# Patient Record
Sex: Female | Born: 1950
Health system: Southern US, Community
[De-identification: ages and names within clinical notes are randomized; demographics above are authoritative.]

## PROBLEM LIST (undated history)

## (undated) DIAGNOSIS — F329 Major depressive disorder, single episode, unspecified: Secondary | ICD-10-CM

## (undated) DIAGNOSIS — E039 Hypothyroidism, unspecified: Secondary | ICD-10-CM

## (undated) DIAGNOSIS — E785 Hyperlipidemia, unspecified: Secondary | ICD-10-CM

## (undated) DIAGNOSIS — R002 Palpitations: Secondary | ICD-10-CM

## (undated) DIAGNOSIS — M1990 Unspecified osteoarthritis, unspecified site: Secondary | ICD-10-CM

## (undated) DIAGNOSIS — E119 Type 2 diabetes mellitus without complications: Secondary | ICD-10-CM

## (undated) DIAGNOSIS — F32A Depression, unspecified: Secondary | ICD-10-CM

## (undated) DIAGNOSIS — I10 Essential (primary) hypertension: Secondary | ICD-10-CM

## (undated) HISTORY — DX: Hyperlipidemia, unspecified: E78.5

## (undated) HISTORY — DX: Unspecified osteoarthritis, unspecified site: M19.90

---
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
## 1983-05-02 HISTORY — PX: CHOLECYSTECTOMY: SHX55

## 2000-11-08 ENCOUNTER — Emergency Department (HOSPITAL_COMMUNITY): Admission: EM | Admit: 2000-11-08 | Discharge: 2000-11-08 | Payer: Self-pay | Admitting: Emergency Medicine

## 2012-05-31 DIAGNOSIS — E119 Type 2 diabetes mellitus without complications: Secondary | ICD-10-CM

## 2012-05-31 HISTORY — DX: Type 2 diabetes mellitus without complications: E11.9

## 2012-07-25 ENCOUNTER — Encounter (HOSPITAL_COMMUNITY): Payer: Self-pay | Admitting: *Deleted

## 2012-07-25 ENCOUNTER — Inpatient Hospital Stay (HOSPITAL_COMMUNITY)
Admission: AD | Admit: 2012-07-25 | Discharge: 2012-07-25 | Disposition: A | Discharge status: Home / Self Care | Payer: Self-pay | Source: Ambulatory Visit | Attending: Emergency Medicine | Admitting: Emergency Medicine

## 2012-07-25 DIAGNOSIS — Z9114 Patient's other noncompliance with medication regimen: Secondary | ICD-10-CM

## 2012-07-25 DIAGNOSIS — E1169 Type 2 diabetes mellitus with other specified complication: Secondary | ICD-10-CM | POA: Insufficient documentation

## 2012-07-25 DIAGNOSIS — R42 Dizziness and giddiness: Secondary | ICD-10-CM | POA: Insufficient documentation

## 2012-07-25 DIAGNOSIS — E118 Type 2 diabetes mellitus with unspecified complications: Secondary | ICD-10-CM

## 2012-07-25 DIAGNOSIS — E1165 Type 2 diabetes mellitus with hyperglycemia: Secondary | ICD-10-CM

## 2012-07-25 DIAGNOSIS — R739 Hyperglycemia, unspecified: Secondary | ICD-10-CM

## 2012-07-25 DIAGNOSIS — R350 Frequency of micturition: Secondary | ICD-10-CM | POA: Insufficient documentation

## 2012-07-25 DIAGNOSIS — Z79899 Other long term (current) drug therapy: Secondary | ICD-10-CM | POA: Insufficient documentation

## 2012-07-25 DIAGNOSIS — I10 Essential (primary) hypertension: Secondary | ICD-10-CM | POA: Insufficient documentation

## 2012-07-25 DIAGNOSIS — F3289 Other specified depressive episodes: Secondary | ICD-10-CM | POA: Insufficient documentation

## 2012-07-25 DIAGNOSIS — R9431 Abnormal electrocardiogram [ECG] [EKG]: Secondary | ICD-10-CM

## 2012-07-25 DIAGNOSIS — F329 Major depressive disorder, single episode, unspecified: Secondary | ICD-10-CM | POA: Insufficient documentation

## 2012-07-25 DIAGNOSIS — R002 Palpitations: Secondary | ICD-10-CM | POA: Insufficient documentation

## 2012-07-25 DIAGNOSIS — Z794 Long term (current) use of insulin: Secondary | ICD-10-CM | POA: Insufficient documentation

## 2012-07-25 HISTORY — DX: Major depressive disorder, single episode, unspecified: F32.9

## 2012-07-25 HISTORY — DX: Palpitations: R00.2

## 2012-07-25 HISTORY — DX: Depression, unspecified: F32.A

## 2012-07-25 HISTORY — DX: Essential (primary) hypertension: I10

## 2012-07-25 HISTORY — DX: Type 2 diabetes mellitus without complications: E11.9

## 2012-07-25 LAB — POCT I-STAT TROPONIN I: Troponin i, poc: 0 ng/mL (ref 0.00–0.08)

## 2012-07-25 LAB — BASIC METABOLIC PANEL
BUN: 22 mg/dL (ref 6–23)
CO2: 24 mEq/L (ref 19–32)
Calcium: 9.5 mg/dL (ref 8.4–10.5)
Glucose, Bld: 433 mg/dL — ABNORMAL HIGH (ref 70–99)
Sodium: 133 mEq/L — ABNORMAL LOW (ref 135–145)

## 2012-07-25 LAB — GLUCOSE, CAPILLARY: Glucose-Capillary: 262 mg/dL — ABNORMAL HIGH (ref 70–99)

## 2012-07-25 MED ORDER — PAROXETINE HCL 20 MG PO TABS: 20.0000 mg | ORAL_TABLET | ORAL | Status: DC

## 2012-07-25 MED ORDER — INSULIN ASPART 100 UNIT/ML ~~LOC~~ SOLN
20.0000 [IU] | Freq: Once | SUBCUTANEOUS | Status: AC
  Administered 2012-07-25: 20 [IU] via SUBCUTANEOUS
  Filled 2012-07-25: qty 20

## 2012-07-25 MED ORDER — SODIUM CHLORIDE 0.9 % IV SOLN
INTRAVENOUS | Status: DC
  Administered 2012-07-25: 15:00:00 via INTRAVENOUS

## 2012-07-25 MED ORDER — LISINOPRIL 10 MG PO TABS: 10.0000 mg | ORAL_TABLET | Freq: Every day | ORAL | Status: DC

## 2012-07-25 MED ORDER — INSULIN LISPRO PROT & LISPRO (75-25 MIX) 100 UNIT/ML ~~LOC~~ SUSP: 40.0000 [IU] | Freq: Two times a day (BID) | SUBCUTANEOUS | Status: DC

## 2012-07-25 MED ORDER — ATENOLOL 50 MG PO TABS: 50.0000 mg | ORAL_TABLET | Freq: Every day | ORAL | Status: DC

## 2012-07-25 NOTE — ED Notes (Signed)
MD at bedside. 

## 2012-07-25 NOTE — ED Notes (Signed)
Per MD's office, pt sent to ED due to EKG changes. Did not specify, but wanted pt evaluated. Pt has been taking half doses of HTN and DM meds in an effort to make meds last longer due to recent job loss.

## 2012-07-25 NOTE — MAU Provider Note (Signed)
Jennifer Ingram is a 62 y.o. female who presents to MAU for medication refill. She lost her job and insurance and is out of her BP medication. She also has IDDM and is low on her insulin. She has not taken her insulin today because she is trying to space it out to make it last. She has not eaten today and feel a little shaky.  Results for orders placed during the hospital encounter of 07/25/12 (from the past 24 hour(s))  GLUCOSE, CAPILLARY     Status: Abnormal   Collection Time    07/25/12  2:04 PM      Result Value Range   Glucose-Capillary 455 (*) 70 - 99 mg/dL    BP 161/09  Pulse 604  Resp 18  Ht 4' 11.5" (1.511 m)  Wt 155 lb (70.308 kg)  BMI 30.79 kg/m2  SpO2 100%  I spoke with Dr. Blinda Leatherwood at [facility] and I will order BMP. If it is abnormal will transfer patient to [facility] for evaluation. (if Co2 is low) if normal will give IV fluids and insulin and have patient follow up with MD.   Child psychotherapist called and will help patient with getting Rx filled for $3.00 Care turned over to Pamelia Hoit, NP @ 15:00 pm EKG abnormal- will have anesthesia evaluate-anesthesia recommended transfer of pt;  discussed with pharmacy- will give Novolog 20 u now Anesthesia recommended transfer to [facility]- Dr. Blinda Leatherwood accepted transfer Discussed with pt transfer and importance of BS control Results for orders placed during the hospital encounter of 07/25/12 (from the past 24 hour(s))  GLUCOSE, CAPILLARY     Status: Abnormal   Collection Time    07/25/12  2:04 PM      Result Value Range   Glucose-Capillary 455 (*) 70 - 99 mg/dL  BASIC METABOLIC PANEL     Status: Abnormal   Collection Time    07/25/12  2:23 PM      Result Value Range   Sodium 133 (*) 135 - 145 mEq/L   Potassium 4.4  3.5 - 5.1 mEq/L   Chloride 98  96 - 112 mEq/L   CO2 24  19 - 32 mEq/L   Glucose, Bld 433 (*) 70 - 99 mg/dL   BUN 22  6 - 23 mg/dL   Creatinine, Ser 5.40  0.50 - 1.10 mg/dL   Calcium 9.5  8.4 - 98.1 mg/dL   GFR calc non Af Amer  71 (*) >90 mL/min   GFR calc Af Amer 82 (*) >90 mL/min  Assessment/Plan Uncontrolled Diabetic  Abnormal EKG Transfer to [facility] ED for further evaluation

## 2012-07-25 NOTE — ED Notes (Signed)
Per EMS, has not been taking meds r/t recently lost job, can afford medication-CBG at PCP office was 455-given 20 units of regular insulin, now 382-asymptomatic

## 2012-07-25 NOTE — MAU Note (Signed)
Pt states since lost job has ran out of Atenolol, only taking insulin once a day.  "trying to stretch meds".  Has been checking blood sugars, they have been running 200-250. Last took insulin 03/26 p.m.

## 2012-07-25 NOTE — Progress Notes (Signed)
MATCH Medication Assistance Card Name: Kalene, Cutler (MRN): [id number] Bin: [id number] RX Group: [id number] PCN: [id number]  Discharge Date: 07/25/2012 Expiration Date: 08/01/2012 (must be filled within 7 days of discharge  Dear _____Rita F Shelton______________________:  Bonita Quin have been approved to have your discharge prescriptions filled through our [facility] (Medication Assistance Through [facility]) program. This program allows for a one-time (no refills) 34-day supply of selected medications for a low copay amount.  The copay is $3.00 per prescription. For instance, if you have one prescription, you will pay $3.00; for two prescriptions, you pay $6.00; for three prescriptions, you pay $9.00; and so on.  Only certain pharmacies are participating in this program with [facility]. You will need to select one of the pharmacies from the attached list and take your prescriptions, this letter, and your photo ID to one of the participating pharmacies.   We are excited that you are able to use the [facility] program to get your medications. These prescriptions must be filled within 7 days of hospital discharge or they will no longer be valid for the [facility] program. Should you have any problems with your prescriptions please contact your case management team member at [phone].  Thank you,   [employer] [facility]   Participating [facility]  [facility]   [facility] [address] [place], [place]   [facility] [address] [place], [place]   [facility] [address] [place], [place]   [facility]   [address], [place], [place]   [address] [place], [place], [place]   [address], [place], [place]   [address] [place], [place]   [address] [place], [place]   [address], [place], [place]   [address], [place], [place]   [address] [place], [place]   [address], [place], [place]   [address] [place], [place], [place]  [facility]   [address], [place], [place]   [address] [place], [place]   [address] [place], [place], [place]   [address], [place], [place]   [address] [place], [place]   [address] [place] [address] [place], [place], [place] [facility]   [address] [place], [place]   [address] [employer], [place], [place]   [address] [place], [place]   [address], [place], [place]   [address], [place], [place]   [address], [place], [place]   [address] [place], [place]   [address], [place], [place]   [address], [place], [place]   [address], [place], [place]   [address], [place], [place]   [address], [place], [place]   [address] [place], [place]   [address] [place], [place], [place]  Independent Pharmacies   [facility] [address] [place], [address] [place], [place]   [facility] [address] [place], [place]   [facility] [address] [place], [place]   For continued medication needs, please contact the [facility] Department at  [phone].

## 2012-07-25 NOTE — Progress Notes (Signed)
[facility] ED CM consulted by tracy, CM at [facility] to assist with [facility] assistance for pt being transferred from [facility] hospital . Pt reports not working but can afford $3 co pay. Cm reviewed EPIC chart notes and PDMI name inquiry Pt is eligible for [facility] MATCH assistance  Pt entered in PDMI, MATCH letter completed and provided to pt  Pt confirms she is a self pay [employer] resident with no pcp. CM discussed and provided written information for self pay pcps (family medicine at [address], [facility], [facility] family practice, general medical on [place] and [address], [facility], importance of pcp for f/u care, differences in emergency (crisis) and community medical care levels, discounted pharmacies, MATCH program ($3 co-pay at pharmacy of choice) and other [place] resources such as financial assistance, DSS and health department. Reviewed affordable care act/health reform (deadline 07/29/12) aPt voiced understanding and appreciation of resources provided  Pt states she has applied for medicaid Reports she will attempt to sign up for affordable care act market place coverage

## 2012-07-25 NOTE — MAU Note (Signed)
Atenalol, out of it- hasn't had it for 3 days.  Lost ins, doesn't feel right - like heart is racing. Waiting on medicaid. Needs rx.

## 2012-07-25 NOTE — MAU Note (Signed)
Tracey from Case Management came while preparing for transfer.... Have [facility] RN contact ED Case Management to assist with prescriptions.

## 2012-07-25 NOTE — ED Notes (Signed)
GUY:[id number]<VQ> Expected date:<BR> Expected time:<BR> Means of arrival:<BR> Comments:<BR> Carelink Tx

## 2012-07-25 NOTE — ED Provider Notes (Signed)
History     CSN: [id number]  Arrival date & time 07/25/12  1242   First MD Initiated Contact with Patient 07/25/12 1711      Chief Complaint  Patient presents with  . Fatigue    (Consider location/radiation/quality/duration/timing/severity/associated sxs/prior treatment) The history is provided by the patient.  patient was a transfer from [facility] hospital for hyperglycemia and palpitations. She's been out of her atenolol due to financial issues. She also spent doing less insulin than normal also due to financial issues. No chest pain. No lightheadedness dizziness. She has had some urinary frequency. She was seen at [facility] hospital and had hyperglycemia 450. She is decreased in the 200s with fluids and medication her. She was sent over here for EKG changes. She also was sent over to see care management about affordable prescriptions.no shortness of breath no cough. The lightheadedness or dizziness.  Past Medical History  Diagnosis Date  . Hypertension   . Heart palpitations     rapid  . Diabetes mellitus without complication   . Depression     on meds, working well    Past Surgical History  Procedure Laterality Date  . Cesarean section    . Cholecystectomy  1985    Family History  Problem Relation Age of Onset  . Cancer Mother     leukemia  . Cancer Father     lung    History  Substance Use Topics  . Smoking status: Never Smoker   . Smokeless tobacco: Never Used  . Alcohol Use: No    OB History   Grav Para Term Preterm Abortions TAB SAB Ect Mult Living   1 1 1       1       Review of Systems  Constitutional: Negative for activity change and appetite change.  HENT: Negative for neck stiffness.   Eyes: Negative for pain.  Respiratory: Negative for chest tightness and shortness of breath.   Cardiovascular: Positive for palpitations. Negative for chest pain and leg swelling.  Gastrointestinal: Negative for nausea, vomiting, abdominal pain and diarrhea.   Genitourinary: Positive for frequency. Negative for flank pain.  Musculoskeletal: Negative for back pain.  Skin: Negative for rash.  Neurological: Negative for weakness, numbness and headaches.  Psychiatric/Behavioral: Negative for behavioral problems.    Allergies  Review of patient's allergies indicates no known allergies.  Home Medications   Current Outpatient Rx  Name  Route  Sig  Dispense  Refill  . ibuprofen (ADVIL,MOTRIN) 200 MG tablet   Oral   Take 400 mg by mouth every 6 (six) hours as needed for pain.         Marland Kitchen atenolol (TENORMIN) 50 MG tablet   Oral   Take 1 tablet (50 mg total) by mouth daily.   30 tablet   0   . insulin lispro protamine-insulin lispro (HUMALOG 75/25) (75-25) 100 UNIT/ML SUSP   Subcutaneous   Inject 40 Units into the skin 2 (two) times daily.   10 mL   0   . lisinopril (PRINIVIL,ZESTRIL) 10 MG tablet   Oral   Take 1 tablet (10 mg total) by mouth daily.   30 tablet   0   . PARoxetine (PAXIL) 20 MG tablet   Oral   Take 1 tablet (20 mg total) by mouth every morning.   30 tablet   0     BP 122/83  Pulse 107  Temp(Src) 98.1 F (36.7 C) (Oral)  Resp 16  Ht 4' 11.5" (1.511 m)  Wt 155 lb (70.308 kg)  BMI 30.79 kg/m2  SpO2 99%  Physical Exam  Nursing note and vitals reviewed. Constitutional: She is oriented to person, place, and time. She appears well-developed and well-nourished.  HENT:  Head: Normocephalic and atraumatic.  Eyes: EOM are normal. Pupils are equal, round, and reactive to light.  Neck: Normal range of motion. Neck supple.  Cardiovascular: Normal rate, regular rhythm and normal heart sounds.   No murmur heard. Pulmonary/Chest: Effort normal and breath sounds normal. No respiratory distress. She has no wheezes. She has no rales.  Abdominal: Soft. Bowel sounds are normal. She exhibits no distension. There is no tenderness. There is no rebound and no guarding.  Musculoskeletal: Normal range of motion.  Neurological:  She is alert and oriented to person, place, and time. No cranial nerve deficit.  Skin: Skin is warm and dry.  Psychiatric: She has a normal mood and affect. Her speech is normal.    ED Course  Procedures (including critical care time)  Labs Reviewed  GLUCOSE, CAPILLARY - Abnormal; Notable for the following:    Glucose-Capillary 455 (*)    All other components within normal limits  BASIC METABOLIC PANEL - Abnormal; Notable for the following:    Sodium 133 (*)    Glucose, Bld 433 (*)    GFR calc non Af Amer 71 (*)    GFR calc Af Amer 82 (*)    All other components within normal limits  GLUCOSE, CAPILLARY - Abnormal; Notable for the following:    Glucose-Capillary 382 (*)    All other components within normal limits  GLUCOSE, CAPILLARY - Abnormal; Notable for the following:    Glucose-Capillary 262 (*)    All other components within normal limits  POCT I-STAT TROPONIN I   No results found.   1. Type II or unspecified type diabetes mellitus with unspecified complication, uncontrolled   2. Nonspecific abnormal electrocardiogram (ECG) (EKG)   3. Noncompliance with medications   4. Hyperglycemia       MDM  Patient presents with hyperglycemia and palpitations. Sugar has improved down to the 200s. She has insulin at home. She also has palpitations but has been off of her atenolol. EKG is reassuring. Troponin was done and is negative. Patient be discharged home and has had financial resources.EKG was done at [facility] hospital but was reviewed by me. It showed mild sinus tachycardia with left axis deviation and incomplete left bundle-branch block. No worrisome ST or T wave changes.        Juliet Rude. Rubin Payor, MD 07/26/12 0020

## 2012-07-25 NOTE — MAU Note (Signed)
Patient states she is on blood pressure medication but has been out for about 3 days and is not feeling well.

## 2012-07-26 NOTE — MAU Provider Note (Signed)
Attestation of Attending Supervision of Advanced Practitioner (CNM/NP): Evaluation and management procedures were performed by the Advanced Practitioner under my supervision and collaboration.  I have reviewed the Advanced Practitioner's note and chart, and I agree with the management and plan.  Shanette Tamargo 07/26/2012 7:44 AM

## 2013-03-13 ENCOUNTER — Encounter (HOSPITAL_COMMUNITY): Payer: Self-pay | Admitting: Emergency Medicine

## 2013-03-13 ENCOUNTER — Emergency Department (HOSPITAL_COMMUNITY)
Admission: EM | Admit: 2013-03-13 | Discharge: 2013-03-13 | Disposition: A | Discharge status: Home / Self Care | Payer: Self-pay | Attending: Emergency Medicine | Admitting: Emergency Medicine

## 2013-03-13 DIAGNOSIS — I10 Essential (primary) hypertension: Secondary | ICD-10-CM | POA: Insufficient documentation

## 2013-03-13 DIAGNOSIS — Z794 Long term (current) use of insulin: Secondary | ICD-10-CM | POA: Insufficient documentation

## 2013-03-13 DIAGNOSIS — E119 Type 2 diabetes mellitus without complications: Secondary | ICD-10-CM | POA: Insufficient documentation

## 2013-03-13 DIAGNOSIS — F329 Major depressive disorder, single episode, unspecified: Secondary | ICD-10-CM | POA: Insufficient documentation

## 2013-03-13 DIAGNOSIS — Z79899 Other long term (current) drug therapy: Secondary | ICD-10-CM | POA: Insufficient documentation

## 2013-03-13 DIAGNOSIS — R739 Hyperglycemia, unspecified: Secondary | ICD-10-CM

## 2013-03-13 DIAGNOSIS — F3289 Other specified depressive episodes: Secondary | ICD-10-CM | POA: Insufficient documentation

## 2013-03-13 LAB — BASIC METABOLIC PANEL
Chloride: 91 mEq/L — ABNORMAL LOW (ref 96–112)
GFR calc Af Amer: 86 mL/min — ABNORMAL LOW (ref >90)
GFR calc non Af Amer: 74 mL/min — ABNORMAL LOW (ref >90)
Glucose, Bld: 557 mg/dL (ref 70–99)
Potassium: 3.6 mEq/L (ref 3.5–5.1)
Sodium: 132 mEq/L — ABNORMAL LOW (ref 135–145)

## 2013-03-13 LAB — CBC
Hemoglobin: 14.6 g/dL (ref 12.0–15.0)
MCHC: 35.4 g/dL (ref 30.0–36.0)
RDW: 12.6 % (ref 11.5–15.5)
WBC: 13.4 10*3/uL — ABNORMAL HIGH (ref 4.0–10.5)

## 2013-03-13 LAB — GLUCOSE, CAPILLARY
Glucose-Capillary: 432 mg/dL — ABNORMAL HIGH (ref 70–99)
Glucose-Capillary: 303 mg/dL — ABNORMAL HIGH (ref 70–99)

## 2013-03-13 MED ORDER — INSULIN ASPART PROT & ASPART (70-30 MIX) 100 UNIT/ML ~~LOC~~ SUSP: 40.0000 [IU] | Freq: Two times a day (BID) | SUBCUTANEOUS | Status: DC

## 2013-03-13 MED ORDER — SODIUM CHLORIDE 0.9 % IV BOLUS (SEPSIS)
500.0000 mL | Freq: Once | INTRAVENOUS | Status: AC
  Administered 2013-03-13: 500 mL via INTRAVENOUS

## 2013-03-13 MED ORDER — SODIUM CHLORIDE 0.9 % IV BOLUS (SEPSIS)
1000.0000 mL | Freq: Once | INTRAVENOUS | Status: AC
  Administered 2013-03-13: 1000 mL via INTRAVENOUS

## 2013-03-13 MED ORDER — INSULIN ASPART 100 UNIT/ML IV SOLN
10.0000 [IU] | Freq: Once | INTRAVENOUS | Status: AC
  Administered 2013-03-13: 10 [IU] via INTRAVENOUS
  Filled 2013-03-13: qty 0.1

## 2013-03-13 MED ORDER — ONDANSETRON HCL 4 MG/2ML IJ SOLN
4.0000 mg | Freq: Once | INTRAMUSCULAR | Status: AC
  Administered 2013-03-13: 4 mg via INTRAVENOUS
  Filled 2013-03-13: qty 2

## 2013-03-13 NOTE — ED Notes (Signed)
Takes Novolog 70/30 per EMS

## 2013-03-13 NOTE — Progress Notes (Signed)
EDCM called patient's brother Tug at [phone] to have him pick her up at [facility] emergency room.  ED RN aware.

## 2013-03-13 NOTE — Progress Notes (Signed)
[facility] consulted by EDP regarding medication assistance. EDCM spoke to patient at bedside.  Patient was entered into the [facility] program on 07/25/2012.  As per patient, she does not have a job right now.  Patient is Medicaid pending and therefore does not qualify for the orange card.  Patient reports she has filed for disability.  Patient confirms she sees Dr. Andi Devon at [facility] for her medical needs.  Patient has been off of her insulin for three days.  Patient reports she does not need help with any other medications right now just her insulin.  Patient is aware a vial of insulin costs twenty four dollars at [facility] but does not have the money currently to purchase her insulin.  EDCM provided patient with money to purchase her insulin.  Patient reports that she has not seen her pcp in a while because she hasn't had the money to pay for the visit.  Patient reports she will be getting money in January, and that her sister may be able to help try to get money together to see her physician. Patient very thankful for assistance.  No further CM needs at this time.

## 2013-03-13 NOTE — ED Notes (Signed)
Feeling weak and thirsty. Has not had her insulin for several days. CBG 588.

## 2013-03-13 NOTE — ED Provider Notes (Signed)
  Medical screening examination/treatment/procedure(s) were performed by non-physician practitioner and as supervising physician I was immediately available for consultation/collaboration.     Vonetta Foulk, MD 03/13/13 2012 

## 2013-03-13 NOTE — ED Notes (Signed)
Bed: WA06 Expected date:  Expected time:  Means of arrival:  Comments: EMS-hypergylcemia

## 2013-03-13 NOTE — Progress Notes (Signed)
[facility] CL provided pt with a list of primary care resources. Patient stated her PCP was at [facility]. Also, offered patient information on the MAP program through the [facility] Department for medication assistance.

## 2013-03-13 NOTE — ED Provider Notes (Signed)
CSN: [id number]     Arrival date & time 03/13/13  1459 History   First MD Initiated Contact with Patient 03/13/13 1503     No chief complaint on file.  (Consider location/radiation/quality/duration/timing/severity/associated sxs/prior Treatment) HPI Comments: Patient is a 62 y/o female with a PMHx of DM, HTN and depression who presents to the ED via EMS complaining of feeling weak and increased thirst x 2 days since not having any insulin for the past 3 days. States she does not have the money to get her insulin right now since she lost her job. Each time she checks her blood sugar it is over 500. CBG via EMS 588. Slightly nauseated, no vomiting, has dry mouth. Denies fever, chills, recent illness, abdominal pain, chest pain, sob.   The history is provided by the patient.    Past Medical History  Diagnosis Date  . Hypertension   . Heart palpitations     rapid  . Diabetes mellitus without complication   . Depression     on meds, working well   Past Surgical History  Procedure Laterality Date  . Cesarean section    . Cholecystectomy  1985   Family History  Problem Relation Age of Onset  . Cancer Mother     leukemia  . Cancer Father     lung   History  Substance Use Topics  . Smoking status: Never Smoker   . Smokeless tobacco: Never Used  . Alcohol Use: No   OB History   Grav Para Term Preterm Abortions TAB SAB Ect Mult Living   1 1 1       1      Review of Systems  Gastrointestinal: Positive for nausea.  Endocrine: Positive for polydipsia.  Neurological: Positive for weakness.  All other systems reviewed and are negative.    Allergies  Review of patient's allergies indicates no known allergies.  Home Medications   Current Outpatient Rx  Name  Route  Sig  Dispense  Refill  . atenolol (TENORMIN) 50 MG tablet   Oral   Take 1 tablet (50 mg total) by mouth daily.   30 tablet   0   . ibuprofen (ADVIL,MOTRIN) 200 MG tablet   Oral   Take 400 mg by mouth every 6  (six) hours as needed for pain.         Marland Kitchen insulin lispro protamine-insulin lispro (HUMALOG 75/25) (75-25) 100 UNIT/ML SUSP   Subcutaneous   Inject 40 Units into the skin 2 (two) times daily.   10 mL   0   . lisinopril (PRINIVIL,ZESTRIL) 10 MG tablet   Oral   Take 1 tablet (10 mg total) by mouth daily.   30 tablet   0   . PARoxetine (PAXIL) 20 MG tablet   Oral   Take 1 tablet (20 mg total) by mouth every morning.   30 tablet   0    There were no vitals taken for this visit. Physical Exam  Nursing note and vitals reviewed. Constitutional: She is oriented to person, place, and time. She appears well-developed and well-nourished. No distress.  HENT:  Head: Normocephalic and atraumatic.  Mouth/Throat: Mucous membranes are dry.  Eyes: Conjunctivae and EOM are normal. Pupils are equal, round, and reactive to light. No scleral icterus.  Neck: Normal range of motion. Neck supple.  Cardiovascular: Normal rate, regular rhythm, normal heart sounds and intact distal pulses.   Pulmonary/Chest: Effort normal and breath sounds normal.  Abdominal: Soft. Bowel sounds are normal.  She exhibits no distension. There is no tenderness.  Musculoskeletal: Normal range of motion. She exhibits no edema.  Neurological: She is alert and oriented to person, place, and time.  Skin: Skin is warm and dry. She is not diaphoretic.  Psychiatric: She has a normal mood and affect. Her behavior is normal.    ED Course  Procedures (including critical care time) Labs Review Labs Reviewed  CBC - Abnormal; Notable for the following:    WBC 13.4 (*)    All other components within normal limits  BASIC METABOLIC PANEL - Abnormal; Notable for the following:    Sodium 132 (*)    Chloride 91 (*)    Glucose, Bld 557 (*)    GFR calc non Af Amer 74 (*)    GFR calc Af Amer 86 (*)    All other components within normal limits  GLUCOSE, CAPILLARY - Abnormal; Notable for the following:    Glucose-Capillary 521 (*)     All other components within normal limits  GLUCOSE, CAPILLARY - Abnormal; Notable for the following:    Glucose-Capillary 432 (*)    All other components within normal limits  GLUCOSE, CAPILLARY - Abnormal; Notable for the following:    Glucose-Capillary 303 (*)    All other components within normal limits   Imaging Review No results found.  EKG Interpretation   None       MDM   1. Hyperglycemia     Patient with hyperglycemia, CBG 588 per EMS. She is well appearing and in NAD with normal VS. Labs pending- cbg, cbc, bmp. IV fluids, 10 units novolog. 7:56 PM CBG down to 268 after 2.5 L fluids and 10 units novolog. Case management spoke with patient to help with affording insulin prescription. No acidosis. She is well appearing, stable for discharge. Return precautions given. Patient states understanding of treatment care plan and is agreeable. Prescription for insulin given.  Trevor Mace, PA-C 03/13/13 [phone]

## 2013-04-02 ENCOUNTER — Ambulatory Visit: Payer: Self-pay | Admitting: Internal Medicine

## 2013-04-02 ENCOUNTER — Ambulatory Visit: Payer: Self-pay

## 2013-11-05 ENCOUNTER — Encounter (HOSPITAL_COMMUNITY): Payer: Self-pay | Admitting: Emergency Medicine

## 2013-11-05 ENCOUNTER — Emergency Department (HOSPITAL_COMMUNITY)
Admission: EM | Admit: 2013-11-05 | Discharge: 2013-11-05 | Disposition: A | Discharge status: Home / Self Care | Payer: Self-pay | Attending: Emergency Medicine | Admitting: Emergency Medicine

## 2013-11-05 DIAGNOSIS — R1110 Vomiting, unspecified: Secondary | ICD-10-CM | POA: Insufficient documentation

## 2013-11-05 DIAGNOSIS — Z794 Long term (current) use of insulin: Secondary | ICD-10-CM | POA: Insufficient documentation

## 2013-11-05 DIAGNOSIS — R197 Diarrhea, unspecified: Secondary | ICD-10-CM | POA: Insufficient documentation

## 2013-11-05 DIAGNOSIS — F329 Major depressive disorder, single episode, unspecified: Secondary | ICD-10-CM | POA: Insufficient documentation

## 2013-11-05 DIAGNOSIS — Z79899 Other long term (current) drug therapy: Secondary | ICD-10-CM | POA: Insufficient documentation

## 2013-11-05 DIAGNOSIS — N390 Urinary tract infection, site not specified: Secondary | ICD-10-CM | POA: Insufficient documentation

## 2013-11-05 DIAGNOSIS — E876 Hypokalemia: Secondary | ICD-10-CM | POA: Insufficient documentation

## 2013-11-05 DIAGNOSIS — I10 Essential (primary) hypertension: Secondary | ICD-10-CM | POA: Insufficient documentation

## 2013-11-05 DIAGNOSIS — E119 Type 2 diabetes mellitus without complications: Secondary | ICD-10-CM | POA: Insufficient documentation

## 2013-11-05 DIAGNOSIS — F3289 Other specified depressive episodes: Secondary | ICD-10-CM | POA: Insufficient documentation

## 2013-11-05 LAB — CBC
HCT: 40.7 % (ref 36.0–46.0)
Hemoglobin: 14.4 g/dL (ref 12.0–15.0)
MCH: 31.7 pg (ref 26.0–34.0)
MCHC: 35.4 g/dL (ref 30.0–36.0)
MCV: 89.6 fL (ref 78.0–100.0)
PLATELETS: 321 10*3/uL (ref 150–400)
RBC: 4.54 MIL/uL (ref 3.87–5.11)
RDW: 12.2 % (ref 11.5–15.5)
WBC: 14.1 10*3/uL — AB (ref 4.0–10.5)

## 2013-11-05 LAB — URINE MICROSCOPIC-ADD ON

## 2013-11-05 LAB — I-STAT CHEM 8, ED
BUN: 19 mg/dL (ref 6–23)
CALCIUM ION: 1.15 mmol/L (ref 1.13–1.30)
CHLORIDE: 99 meq/L (ref 96–112)
Creatinine, Ser: 1 mg/dL (ref 0.50–1.10)
Glucose, Bld: 94 mg/dL (ref 70–99)
HEMATOCRIT: 44 % (ref 36.0–46.0)
Hemoglobin: 15 g/dL (ref 12.0–15.0)
Potassium: 3.2 mEq/L — ABNORMAL LOW (ref 3.7–5.3)
Sodium: 144 mEq/L (ref 137–147)
TCO2: 29 mmol/L (ref 0–100)

## 2013-11-05 LAB — URINALYSIS, ROUTINE W REFLEX MICROSCOPIC
Bilirubin Urine: NEGATIVE
Glucose, UA: NEGATIVE mg/dL
Hgb urine dipstick: NEGATIVE
KETONES UR: NEGATIVE mg/dL
NITRITE: POSITIVE — AB
PROTEIN: NEGATIVE mg/dL
Specific Gravity, Urine: 1.013 (ref 1.005–1.030)
Urobilinogen, UA: 0.2 mg/dL (ref 0.0–1.0)
pH: 5 (ref 5.0–8.0)

## 2013-11-05 LAB — CBG MONITORING, ED: Glucose-Capillary: 84 mg/dL (ref 70–99)

## 2013-11-05 MED ORDER — CEPHALEXIN 500 MG PO CAPS
500.0000 mg | ORAL_CAPSULE | Freq: Once | ORAL | Status: AC
  Administered 2013-11-05: 500 mg via ORAL
  Filled 2013-11-05: qty 1

## 2013-11-05 MED ORDER — CEPHALEXIN 500 MG PO CAPS: 500.0000 mg | ORAL_CAPSULE | Freq: Four times a day (QID) | ORAL | Status: DC

## 2013-11-05 MED ORDER — ONDANSETRON HCL 8 MG PO TABS: 8.0000 mg | ORAL_TABLET | Freq: Three times a day (TID) | ORAL | Status: DC | PRN

## 2013-11-05 MED ORDER — POTASSIUM CHLORIDE 20 MEQ/15ML (10%) PO LIQD
20.0000 meq | Freq: Once | ORAL | Status: AC
  Administered 2013-11-05: 20 meq via ORAL
  Filled 2013-11-05: qty 15

## 2013-11-05 MED ORDER — ATENOLOL 50 MG PO TABS: 50.0000 mg | ORAL_TABLET | Freq: Every day | ORAL | Status: DC

## 2013-11-05 MED ORDER — ONDANSETRON 8 MG PO TBDP
8.0000 mg | ORAL_TABLET | Freq: Once | ORAL | Status: AC
  Administered 2013-11-05: 8 mg via ORAL
  Filled 2013-11-05: qty 1

## 2013-11-05 NOTE — Progress Notes (Signed)
[facility] CL provided pt with a list of primary care resources, highlighting [facility]. CL spoke with patient about [facility] [employer] [facility] program and the importance of obtaining a pcp. Explained to patient that [facility]. Patient was familiar with program, just never enrolled. Patient stated that [facility]

## 2013-11-05 NOTE — ED Notes (Signed)
Bed: [id number] Expected date:  Expected time:  Means of arrival:  Comments: [place]

## 2013-11-05 NOTE — ED Provider Notes (Signed)
CSN: [id number]     Arrival date & time 11/05/13  1042 History   First MD Initiated Contact with Patient 11/05/13 1153     Chief Complaint  Patient presents with  . Weakness     Patient is a 63 y.o. female presenting with vomiting and diarrhea. The history is provided by the patient.  Emesis Severity:  Moderate Duration:  2 days Timing:  Intermittent Progression:  Improving Chronicity:  New Relieved by:  None tried Worsened by:  Nothing tried Associated symptoms: diarrhea   Associated symptoms: no abdominal pain   Risk factors: no sick contacts and no travel to endemic areas   Diarrhea Quality:  Watery Severity:  Moderate Onset quality:  Gradual Number of episodes:  4 Timing:  Intermittent Progression:  Improving Associated symptoms: vomiting   Associated symptoms: no abdominal pain   Pt reports she had two days of nonbloody vomiting/diarrhea.  She reports 2 episodes of vomiting and 4 episodes of diarrhea.  It is now improving.  No abd pain/fever.  She reports generalized fatigue  [facility], but he was not available so EMS called to bring to hospital.     Past Medical History  Diagnosis Date  . Hypertension   . Heart palpitations     rapid  . Diabetes mellitus without complication   . Depression     on meds, working well   Past Surgical History  Procedure Laterality Date  . Cesarean section    . Cholecystectomy  1985   Family History  Problem Relation Age of Onset  . Cancer Mother     leukemia  . Cancer Father     lung   History  Substance Use Topics  . Smoking status: Never Smoker   . Smokeless tobacco: Never Used  . Alcohol Use: No   OB History   Grav Para Term Preterm Abortions TAB SAB Ect Mult Living   1 1 1       1      Review of Systems  Constitutional: Positive for fatigue.  Respiratory: Negative for shortness of breath.   Cardiovascular: Negative for chest pain.  Gastrointestinal: Positive for vomiting and  diarrhea. Negative for abdominal pain and blood in stool.  Genitourinary: Negative for dysuria.  Neurological: Positive for weakness. Negative for syncope.       Denies focal weakness.    All other systems reviewed and are negative.     Allergies  Review of patient's allergies indicates no known allergies.  Home Medications   Prior to Admission medications   Medication Sig Start Date End Date Taking? Authorizing Provider  atenolol (TENORMIN) 50 MG tablet Take 50 mg by mouth daily.   Yes Historical Provider, MD  diphenhydrAMINE (BENADRYL) 25 MG tablet Take 25 mg by mouth every 6 (six) hours as needed for allergies.   Yes Historical Provider, MD  insulin aspart protamine- aspart (NOVOLOG MIX 70/30) (70-30) 100 UNIT/ML injection Inject 40 Units into the skin daily with breakfast.   Yes Historical Provider, MD  PARoxetine (PAXIL) 40 MG tablet Take 40 mg by mouth daily after breakfast.   Yes Historical Provider, MD   BP 148/80  Pulse 83  Temp(Src) 98 F (36.7 C) (Oral)  Resp 18  SpO2 99% Physical Exam CONSTITUTIONAL: Well developed/well nourished HEAD: Normocephalic/atraumatic EYES: EOMI/PERRL ENMT: Mucous membranes moist NECK: supple no meningeal signs SPINE:entire spine nontender CV: S1/S2 noted, no murmurs/rubs/gallops noted LUNGS: Lungs are clear to auscultation bilaterally, no apparent  distress ABDOMEN: soft, nontender, no rebound or guarding GU:no cva tenderness NEURO: Pt is awake/alert, moves all extremitiesx4 EXTREMITIES: pulses normal, full ROM SKIN: warm, color normal PSYCH: no abnormalities of mood noted  ED Course  Procedures  1:01 PM Pt well appearing Will try PO challenge and also treat for UTI She also requests refill for her atenolol 50mg  QD as she was supposed to get this from her PCP today  1:59 PM Pt feels improved Vitals reviewed BP 120/79  Pulse 88  Temp(Src) 98 F (36.7 C) (Oral)  Resp 18  SpO2 99% She requests zofran for homegoing  use  Labs Review Labs Reviewed  CBC - Abnormal; Notable for the following:    WBC 14.1 (*)    All other components within normal limits  URINALYSIS, ROUTINE W REFLEX MICROSCOPIC - Abnormal; Notable for the following:    APPearance CLOUDY (*)    Nitrite POSITIVE (*)    Leukocytes, UA MODERATE (*)    All other components within normal limits  URINE MICROSCOPIC-ADD ON - Abnormal; Notable for the following:    Bacteria, UA MANY (*)    All other components within normal limits  I-STAT CHEM 8, ED - Abnormal; Notable for the following:    Potassium 3.2 (*)    All other components within normal limits  CBG MONITORING, ED      EKG Interpretation   Date/Time:  Wednesday November 05 2013 12:01:04 EDT Ventricular Rate:  79 PR Interval:  128 QRS Duration: 97 QT Interval:  405 QTC Calculation: 464 R Axis:   -81 Text Interpretation:  Sinus rhythm Abnormal R-wave progression, late  transition Inferior infarct, old Confirmed by Bebe ShaggyWICKLINE  MD, Francesco Provencal ([id number])  on 11/05/2013 12:05:37 PM      MDM   Final diagnoses:  Vomiting and diarrhea  UTI (lower urinary tract infection)  Hypokalemia    Nursing notes including past medical history and social history reviewed and considered in documentation Labs/vital reviewed and considered     Joya Gaskinsonald W Destiney Sanabia, MD 11/05/13 1400

## 2013-11-05 NOTE — Progress Notes (Signed)
  CARE MANAGEMENT ED NOTE 11/05/2013  Patient:  Kallie EdwardSHELTON,Maud F   Account Number:  [id number]  Date Initiated:  11/05/2013  Documentation initiated by:  Radford PaxFERRERO,Conard Alvira  Subjective/Objective Assessment:   Patient presents to Ed with n/v/d decreased appetite     Subjective/Objective Assessment Detail:     Action/Plan:   Action/Plan Detail:   Anticipated DC Date:       Status Recommendation to Physician:   Result of Recommendation:    Other ED Services  Consult Working Plan    DC Planning Services  Other  PCP issues    Choice offered to / List presented to:            Status of service:  Completed, signed off  ED Comments:   ED Comments Detail:  EDCM spoke to patient at bedside.  Patient reports she is homeless.  Patient reports [place] a [place]. [facility]  [facility] if is is having difficulty getting her medications? Patient replied, "No, it's much better now.  [facility] They give me a three month supply and refills."  Patient reports her lawyer says she will have a trial date soon for her disability.  [facility] at this time.

## 2013-11-05 NOTE — ED Notes (Addendum)
Per [facility]. Pt states that she had n/v/d yesterday and hasnt eaten anything but 2 peanut butter crackers bc she was nauseated and not able to eat anymore but took Novolog 40 units this morning. Pt CBG 103 and was clammy. BP 110/70, HR 64, RR18,

## 2014-03-02 ENCOUNTER — Encounter (HOSPITAL_COMMUNITY): Payer: Self-pay | Admitting: Emergency Medicine

## 2014-09-29 ENCOUNTER — Emergency Department (INDEPENDENT_AMBULATORY_CARE_PROVIDER_SITE_OTHER)
Admission: EM | Admit: 2014-09-29 | Discharge: 2014-09-29 | Disposition: A | Discharge status: Home / Self Care | Payer: Self-pay | Source: Home / Self Care | Attending: Family Medicine | Admitting: Family Medicine

## 2014-09-29 ENCOUNTER — Encounter (HOSPITAL_COMMUNITY): Payer: Self-pay | Admitting: Emergency Medicine

## 2014-09-29 DIAGNOSIS — I10 Essential (primary) hypertension: Secondary | ICD-10-CM

## 2014-09-29 MED ORDER — ATENOLOL 50 MG PO TABS: 50.0000 mg | ORAL_TABLET | Freq: Every day | ORAL | Status: DC

## 2014-09-29 NOTE — Discharge Instructions (Signed)
Managing Your High Blood Pressure Blood pressure is a measurement of how forceful your blood is pressing against the walls of the arteries. Arteries are muscular tubes within the circulatory system. Blood pressure does not stay the same. Blood pressure rises when you are active, excited, or nervous; and it lowers during sleep and relaxation. If the numbers measuring your blood pressure stay above normal most of the time, you are at risk for health problems. High blood pressure (hypertension) is a long-term (chronic) condition in which blood pressure is elevated. A blood pressure reading is recorded as two numbers, such as 120 over 80 (or 120/80). The first, higher number is called the systolic pressure. It is a measure of the pressure in your arteries as the heart beats. The second, lower number is called the diastolic pressure. It is a measure of the pressure in your arteries as the heart relaxes between beats.  Keeping your blood pressure in a normal range is important to your overall health and prevention of health problems, such as heart disease and stroke. When your blood pressure is uncontrolled, your heart has to work harder than normal. High blood pressure is a very common condition in adults because blood pressure tends to rise with age. Men and women are equally likely to have hypertension but at different times in life. Before age 45, men are more likely to have hypertension. After 62 years of age, women are more likely to have it. Hypertension is especially common in African Americans. This condition often has no signs or symptoms. The cause of the condition is usually not known. Your caregiver can help you come up with a plan to keep your blood pressure in a normal, healthy range. BLOOD PRESSURE STAGES Blood pressure is classified into four stages: normal, prehypertension, stage 1, and stage 2. Your blood pressure reading will be used to determine what type of treatment, if any, is necessary.  Appropriate treatment options are tied to these four stages:  Normal  Systolic pressure (mm Hg): below 120.  Diastolic pressure (mm Hg): below 80. Prehypertension  Systolic pressure (mm Hg): 120 to 139.  Diastolic pressure (mm Hg): 80 to 89. Stage1  Systolic pressure (mm Hg): 140 to 159.  Diastolic pressure (mm Hg): 90 to 99. Stage2  Systolic pressure (mm Hg): 160 or above.  Diastolic pressure (mm Hg): 100 or above. RISKS RELATED TO HIGH BLOOD PRESSURE Managing your blood pressure is an important responsibility. Uncontrolled high blood pressure can lead to:  A heart attack.  A stroke.  A weakened blood vessel (aneurysm).  Heart failure.  Kidney damage.  Eye damage.  Metabolic syndrome.  Memory and concentration problems. HOW TO MANAGE YOUR BLOOD PRESSURE Blood pressure can be managed effectively with lifestyle changes and medicines (if needed). Your caregiver will help you come up with a plan to bring your blood pressure within a normal range. Your plan should include the following: Education  Read all information provided by your caregivers about how to control blood pressure.  Educate yourself on the latest guidelines and treatment recommendations. New research is always being done to further define the risks and treatments for high blood pressure. Lifestylechanges  Control your weight.  Avoid smoking.  Stay physically active.  Reduce the amount of salt in your diet.  Reduce stress.  Control any chronic conditions, such as high cholesterol or diabetes.  Reduce your alcohol intake. Medicines  Several medicines (antihypertensive medicines) are available, if needed, to bring blood pressure within a normal range.   Communication  Review all the medicines you take with your caregiver because there may be side effects or interactions.  Talk with your caregiver about your diet, exercise habits, and other lifestyle factors that may be contributing to  high blood pressure.  See your caregiver regularly. Your caregiver can help you create and adjust your plan for managing high blood pressure. RECOMMENDATIONS FOR TREATMENT AND FOLLOW-UP  The following recommendations are based on current guidelines for managing high blood pressure in nonpregnant adults. Use these recommendations to identify the proper follow-up period or treatment option based on your blood pressure reading. You can discuss these options with your caregiver.  Systolic pressure of 120 to 139 or diastolic pressure of 80 to 89: Follow up with your caregiver as directed.  Systolic pressure of 140 to 160 or diastolic pressure of 90 to 100: Follow up with your caregiver within 2 months.  Systolic pressure above 160 or diastolic pressure above 100: Follow up with your caregiver within 1 month.  Systolic pressure above 180 or diastolic pressure above 110: Consider antihypertensive therapy; follow up with your caregiver within 1 week.  Systolic pressure above 200 or diastolic pressure above 120: Begin antihypertensive therapy; follow up with your caregiver within 1 week. Document Released: 01/10/2012 Document Reviewed: 01/10/2012 Exit[employer] Patient Information 2015 [employer]. This information is not intended to replace advice given to you by your health care provider. Make sure you discuss any questions you have with your health care provider.  

## 2014-09-29 NOTE — ED Provider Notes (Signed)
CSN: [id number]     Arrival date & time 09/29/14  1219 History   First MD Initiated Contact with Patient 09/29/14 1404     Chief Complaint  Patient presents with  . Hypertension   (Consider location/radiation/quality/duration/timing/severity/associated sxs/prior Treatment) HPI Comments: 64 year old female who is a [facility] of [facility] where she receives physician care and prescriptions and other basic needs ran out of her medicines approximately 3 weeks ago and does not have an appointment until approximately one week from now. She is here to have one of her medications refilled, atenolol 50 mg. Her symptom is that sometimes she feels like her heart is racing and occasional shortness of breath with exertion. Denies heaviness, tightness, fullness or pressure. Denies edema.  Patient is a 64 y.o. female presenting with hypertension.  Hypertension Pertinent negatives include no chest pain.    Past Medical History  Diagnosis Date  . Hypertension   . Heart palpitations     rapid  . Diabetes mellitus without complication   . Depression     on meds, working well   Past Surgical History  Procedure Laterality Date  . Cesarean section    . Cholecystectomy  1985   Family History  Problem Relation Age of Onset  . Cancer Mother     leukemia  . Cancer Father     lung   History  Substance Use Topics  . Smoking status: Never Smoker   . Smokeless tobacco: Never Used  . Alcohol Use: No   OB History    Gravida Para Term Preterm AB TAB SAB Ectopic Multiple Living   Review of Systems  Constitutional: Negative.  Negative for fever and activity change.  HENT: Negative.   Respiratory: Negative for cough and wheezing.        As per history of present illness  Cardiovascular: Positive for palpitations. Negative for chest pain and leg swelling.  Gastrointestinal: Negative.   Neurological: Negative.     Allergies  Review of patient's allergies  indicates no known allergies.  Home Medications   Prior to Admission medications   Medication Sig Start Date End Date Taking? Authorizing Provider  atenolol (TENORMIN) 50 MG tablet Take 1 tablet (50 mg total) by mouth daily. 09/29/14   Hayden Rasmussen, NP  cephALEXin (KEFLEX) 500 MG capsule Take 1 capsule (500 mg total) by mouth 4 (four) times daily. 11/05/13   Zadie Rhine, MD  diphenhydrAMINE (BENADRYL) 25 MG tablet Take 25 mg by mouth every 6 (six) hours as needed for allergies.    Historical Provider, MD  insulin aspart protamine- aspart (NOVOLOG MIX 70/30) (70-30) 100 UNIT/ML injection Inject 40 Units into the skin daily with breakfast.    Historical Provider, MD  ondansetron (ZOFRAN) 8 MG tablet Take 1 tablet (8 mg total) by mouth every 8 (eight) hours as needed for nausea. 11/05/13   Zadie Rhine, MD  PARoxetine (PAXIL) 40 MG tablet Take 40 mg by mouth daily after breakfast.    Historical Provider, MD   BP 153/97 mmHg  Pulse 89  Temp(Src) 98.3 F (36.8 C) (Oral)  Resp 12  SpO2 98% Physical Exam  Constitutional: She is oriented to person, place, and time. She appears well-developed and well-nourished. No distress.  Neck: Normal range of motion.  Cardiovascular: Normal rate, regular rhythm and normal heart sounds.   Pulmonary/Chest: Breath sounds normal. No respiratory distress. She has no wheezes. She  has no rales.  Musculoskeletal: Normal range of motion. She exhibits no edema or tenderness.  Neurological: She is alert and oriented to person, place, and time. She exhibits normal muscle tone.  Skin: Skin is warm and dry.  Psychiatric: She has a normal mood and affect.  Nursing note and vitals reviewed.   ED Course  Procedures (including critical care time) Labs Review Labs Reviewed - No data to display  Imaging Review No results found.   MDM   1. Essential hypertension    Refill atenolol 50 mg one daily #30 Patient has sufficient HCTZ until her next  appointment. Discharge in stable condition.    Hayden Rasmussenavid Javier Gell, NP 09/29/14 1415

## 2014-09-29 NOTE — ED Notes (Signed)
Reports being out of meds x 3 days.  Unable to get in with primary until next week.

## 2014-10-28 ENCOUNTER — Ambulatory Visit: Payer: Self-pay

## 2014-12-18 ENCOUNTER — Emergency Department (HOSPITAL_COMMUNITY)
Admission: EM | Admit: 2014-12-18 | Discharge: 2014-12-18 | Disposition: A | Discharge status: Home / Self Care | Payer: Self-pay | Attending: Emergency Medicine | Admitting: Emergency Medicine

## 2014-12-18 DIAGNOSIS — Z794 Long term (current) use of insulin: Secondary | ICD-10-CM | POA: Insufficient documentation

## 2014-12-18 DIAGNOSIS — R000 Tachycardia, unspecified: Secondary | ICD-10-CM | POA: Insufficient documentation

## 2014-12-18 DIAGNOSIS — F329 Major depressive disorder, single episode, unspecified: Secondary | ICD-10-CM | POA: Insufficient documentation

## 2014-12-18 DIAGNOSIS — N289 Disorder of kidney and ureter, unspecified: Secondary | ICD-10-CM | POA: Insufficient documentation

## 2014-12-18 DIAGNOSIS — R002 Palpitations: Secondary | ICD-10-CM | POA: Insufficient documentation

## 2014-12-18 DIAGNOSIS — R739 Hyperglycemia, unspecified: Secondary | ICD-10-CM

## 2014-12-18 DIAGNOSIS — E1165 Type 2 diabetes mellitus with hyperglycemia: Secondary | ICD-10-CM | POA: Insufficient documentation

## 2014-12-18 DIAGNOSIS — Z79899 Other long term (current) drug therapy: Secondary | ICD-10-CM | POA: Insufficient documentation

## 2014-12-18 DIAGNOSIS — I10 Essential (primary) hypertension: Secondary | ICD-10-CM | POA: Insufficient documentation

## 2014-12-18 LAB — CBC
HEMATOCRIT: 41.1 % (ref 36.0–46.0)
Hemoglobin: 14 g/dL (ref 12.0–15.0)
MCH: 31.2 pg (ref 26.0–34.0)
MCHC: 34.1 g/dL (ref 30.0–36.0)
MCV: 91.5 fL (ref 78.0–100.0)
PLATELETS: 294 10*3/uL (ref 150–400)
RBC: 4.49 MIL/uL (ref 3.87–5.11)
RDW: 12.9 % (ref 11.5–15.5)
WBC: 11.5 10*3/uL — ABNORMAL HIGH (ref 4.0–10.5)

## 2014-12-18 LAB — BASIC METABOLIC PANEL
Anion gap: 10 (ref 5–15)
BUN: 23 mg/dL — ABNORMAL HIGH (ref 6–20)
CO2: 30 mmol/L (ref 22–32)
CREATININE: 1.35 mg/dL — AB (ref 0.44–1.00)
Calcium: 9.3 mg/dL (ref 8.9–10.3)
Chloride: 95 mmol/L — ABNORMAL LOW (ref 101–111)
GFR, EST AFRICAN AMERICAN: 48 mL/min — AB (ref >60)
GFR, EST NON AFRICAN AMERICAN: 41 mL/min — AB (ref >60)
Glucose, Bld: 307 mg/dL — ABNORMAL HIGH (ref 65–99)
Potassium: 4.4 mmol/L (ref 3.5–5.1)
SODIUM: 135 mmol/L (ref 135–145)

## 2014-12-18 MED ORDER — ATENOLOL 50 MG PO TABS
50.0000 mg | ORAL_TABLET | Freq: Once | ORAL | Status: AC
  Administered 2014-12-18: 50 mg via ORAL
  Filled 2014-12-18: qty 1

## 2014-12-18 MED ORDER — ATENOLOL 50 MG PO TABS: 50.0000 mg | ORAL_TABLET | Freq: Every day | ORAL | Status: DC
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
## 2014-12-18 NOTE — Discharge Instructions (Signed)
Follow-up closely with your Dr. to discuss prescriptions for medications. Hold your blood pressure medicine if you feel like to pass out.  If you were given medicines take as directed.  If you are on coumadin or contraceptives realize their levels and effectiveness is altered by many different medicines.  If you have any reaction (rash, tongues swelling, other) to the medicines stop taking and see a physician.    If your blood pressure was elevated in the ER make sure you follow up for management with a primary doctor or return for chest pain, shortness of breath or stroke symptoms.  Please follow up as directed and return to the ER or see a physician for new or worsening symptoms.  Thank you. Filed Vitals:   12/18/14 1036 12/18/14 1100 12/18/14 1200 12/18/14 1300  BP: 119/73 100/78 94/60 100/60  Pulse: 118 110 85 77  Temp: 98.3 F (36.8 C)     TempSrc: Oral     Resp: SpO2: 98% 99% 95% 96%

## 2014-12-18 NOTE — ED Notes (Signed)
Patient brought back to room; patient undressed, in gown, on monitor, continuous pulse oximetry and blood pressure cuff; Steward Drone, EMT present in room

## 2014-12-29 ENCOUNTER — Other Ambulatory Visit (HOSPITAL_COMMUNITY): Payer: Self-pay | Admitting: *Deleted

## 2014-12-29 DIAGNOSIS — Z1231 Encounter for screening mammogram for malignant neoplasm of breast: Secondary | ICD-10-CM

## 2015-01-05 ENCOUNTER — Ambulatory Visit (HOSPITAL_COMMUNITY): Payer: Self-pay

## 2015-01-08 ENCOUNTER — Ambulatory Visit (HOSPITAL_COMMUNITY)
Admission: RE | Admit: 2015-01-08 | Discharge: 2015-01-08 | Disposition: A | Discharge status: Home / Self Care | Payer: Self-pay | Source: Ambulatory Visit | Attending: *Deleted | Admitting: *Deleted

## 2015-01-08 DIAGNOSIS — Z1231 Encounter for screening mammogram for malignant neoplasm of breast: Secondary | ICD-10-CM

## 2015-02-25 ENCOUNTER — Encounter (HOSPITAL_COMMUNITY): Payer: Self-pay | Admitting: *Deleted

## 2015-02-25 ENCOUNTER — Emergency Department (INDEPENDENT_AMBULATORY_CARE_PROVIDER_SITE_OTHER)
Admission: EM | Admit: 2015-02-25 | Discharge: 2015-02-25 | Disposition: A | Discharge status: Home / Self Care | Payer: Self-pay | Source: Home / Self Care | Attending: Family Medicine | Admitting: Family Medicine

## 2015-02-25 DIAGNOSIS — R002 Palpitations: Secondary | ICD-10-CM

## 2015-02-25 MED ORDER — ATENOLOL 50 MG PO TABS: 50.0000 mg | ORAL_TABLET | Freq: Every day | ORAL | Status: DC

## 2015-02-25 NOTE — Discharge Instructions (Signed)
Take your medicine daily and see your doctor as planned.

## 2015-02-25 NOTE — ED Notes (Signed)
Pt  Reports  She  Has  Been  Out  Of  Her  Atenolol  For  Several  Days         She  Reports   palpatations              She  denys  Any  Chest pain      Or  Any  Shortness  Of breath

## 2015-02-25 NOTE — ED Provider Notes (Signed)
CSN: [id number]     Arrival date & time 02/25/15  1300 History   First MD Initiated Contact with Patient 02/25/15 1316     Chief Complaint  Patient presents with  . Irregular Heart Beat   (Consider location/radiation/quality/duration/timing/severity/associated sxs/prior Treatment) Patient is a 64 y.o. female presenting with palpitations. The history is provided by the patient.  Palpitations Palpitations quality:  Regular Onset quality:  Chronic Progression:  Unchanged Chronicity:  Recurrent Context comment:  Has been out of tenormin for 2 d and feeling occas palp, no cp or dizziness or sob., requesting refill until appt in nov.   Past Medical History  Diagnosis Date  . Hypertension   . Heart palpitations     rapid  . Diabetes mellitus without complication (HCC)   . Depression     on meds, working well   Past Surgical History  Procedure Laterality Date  . Cesarean section    . Cholecystectomy  1985   Family History  Problem Relation Age of Onset  . Cancer Mother     leukemia  . Cancer Father     lung   Social History  Substance Use Topics  . Smoking status: Never Smoker   . Smokeless tobacco: Never Used  . Alcohol Use: No   OB History    Gravida Para Term Preterm AB TAB SAB Ectopic Multiple Living   Review of Systems  Constitutional: Negative.   HENT: Negative.   Respiratory: Negative.   Cardiovascular: Positive for palpitations.  Neurological: Negative.   All other systems reviewed and are negative.   Allergies  Review of patient's allergies indicates no known allergies.  Home Medications   Prior to Admission medications   Medication Sig Start Date End Date Taking? Authorizing Provider  atenolol (TENORMIN) 50 MG tablet Take 1 tablet (50 mg total) by mouth daily. 02/25/15   Linna Hoff, MD  hydrochlorothiazide (HYDRODIURIL) 25 MG tablet Take 25 mg by mouth daily.    Historical Provider, MD  insulin aspart protamine- aspart  (NOVOLOG MIX 70/30) (70-30) 100 UNIT/ML injection Inject 40 Units into the skin daily with breakfast.    Historical Provider, MD  PARoxetine (PAXIL) 40 MG tablet Take 40 mg by mouth daily after breakfast.    Historical Provider, MD   Meds Ordered and Administered this Visit  Medications - No data to display  BP 122/74 mmHg  Pulse 102  Temp(Src) 98.5 F (36.9 C) (Oral)  Resp 16  SpO2 98% No data found.   Physical Exam  Constitutional: She is oriented to person, place, and time. She appears well-developed and well-nourished. No distress.  Neck: Normal range of motion. Neck supple.  Cardiovascular: Normal rate, regular rhythm, normal heart sounds and intact distal pulses.   Pulmonary/Chest: Effort normal and breath sounds normal.  Neurological: She is alert and oriented to person, place, and time.  Skin: Skin is warm and dry.  Nursing note and vitals reviewed.   ED Course  Procedures (including critical care time)  Labs Review Labs Reviewed - No data to display  Imaging Review No results found.   Visual Acuity Review  Right Eye Distance:   Left Eye Distance:   Bilateral Distance:    Right Eye Near:   Left Eye Near:    Bilateral Near:         MDM   1. Heart palpitations        Quita Skye  Artis FlockKindl, MD 02/25/15 2051

## 2016-11-06 ENCOUNTER — Encounter (HOSPITAL_COMMUNITY): Payer: Self-pay

## 2016-11-06 ENCOUNTER — Emergency Department (HOSPITAL_COMMUNITY)
Admission: EM | Admit: 2016-11-06 | Discharge: 2016-11-06 | Disposition: A | Discharge status: Home / Self Care | Payer: Self-pay | Attending: Emergency Medicine | Admitting: Emergency Medicine

## 2016-11-06 ENCOUNTER — Emergency Department (HOSPITAL_COMMUNITY): Payer: Self-pay

## 2016-11-06 DIAGNOSIS — N3000 Acute cystitis without hematuria: Secondary | ICD-10-CM | POA: Insufficient documentation

## 2016-11-06 DIAGNOSIS — Z794 Long term (current) use of insulin: Secondary | ICD-10-CM | POA: Insufficient documentation

## 2016-11-06 DIAGNOSIS — E1165 Type 2 diabetes mellitus with hyperglycemia: Secondary | ICD-10-CM | POA: Insufficient documentation

## 2016-11-06 DIAGNOSIS — I10 Essential (primary) hypertension: Secondary | ICD-10-CM | POA: Insufficient documentation

## 2016-11-06 DIAGNOSIS — R739 Hyperglycemia, unspecified: Secondary | ICD-10-CM

## 2016-11-06 DIAGNOSIS — R112 Nausea with vomiting, unspecified: Secondary | ICD-10-CM | POA: Insufficient documentation

## 2016-11-06 LAB — URINALYSIS, ROUTINE W REFLEX MICROSCOPIC
BILIRUBIN URINE: NEGATIVE
Glucose, UA: >=500 mg/dL — AB
Hgb urine dipstick: NEGATIVE
Ketones, ur: 5 mg/dL — AB
Nitrite: NEGATIVE
PROTEIN: NEGATIVE mg/dL
SPECIFIC GRAVITY, URINE: 1.016 (ref 1.005–1.030)
pH: 5 (ref 5.0–8.0)

## 2016-11-06 LAB — CBC
HCT: 41.9 % (ref 36.0–46.0)
HEMOGLOBIN: 14.5 g/dL (ref 12.0–15.0)
MCH: 31.2 pg (ref 26.0–34.0)
MCHC: 34.6 g/dL (ref 30.0–36.0)
MCV: 90.1 fL (ref 78.0–100.0)
Platelets: 287 10*3/uL (ref 150–400)
RBC: 4.65 MIL/uL (ref 3.87–5.11)
RDW: 13.1 % (ref 11.5–15.5)
WBC: 14.4 10*3/uL — ABNORMAL HIGH (ref 4.0–10.5)

## 2016-11-06 LAB — COMPREHENSIVE METABOLIC PANEL
ALBUMIN: 4.4 g/dL (ref 3.5–5.0)
ALT: 13 U/L — ABNORMAL LOW (ref 14–54)
AST: 18 U/L (ref 15–41)
Alkaline Phosphatase: 71 U/L (ref 38–126)
Anion gap: 10 (ref 5–15)
BUN: 29 mg/dL — AB (ref 6–20)
CHLORIDE: 105 mmol/L (ref 101–111)
CO2: 26 mmol/L (ref 22–32)
Calcium: 9.4 mg/dL (ref 8.9–10.3)
Creatinine, Ser: 1.07 mg/dL — ABNORMAL HIGH (ref 0.44–1.00)
GFR calc Af Amer: >60 mL/min (ref >60)
GFR calc non Af Amer: 54 mL/min — ABNORMAL LOW (ref >60)
GLUCOSE: 268 mg/dL — AB (ref 65–99)
POTASSIUM: 4.4 mmol/L (ref 3.5–5.1)
SODIUM: 141 mmol/L (ref 135–145)
Total Bilirubin: 0.6 mg/dL (ref 0.3–1.2)
Total Protein: 8.2 g/dL — ABNORMAL HIGH (ref 6.5–8.1)

## 2016-11-06 LAB — LIPASE, BLOOD: LIPASE: 37 U/L (ref 11–51)

## 2016-11-06 LAB — CBG MONITORING, ED: GLUCOSE-CAPILLARY: 258 mg/dL — AB (ref 65–99)

## 2016-11-06 MED ORDER — CEPHALEXIN 500 MG PO CAPS: 500.0000 mg | ORAL_CAPSULE | Freq: Four times a day (QID) | ORAL | 0 refills | Status: DC

## 2016-11-06 MED ORDER — ONDANSETRON HCL 4 MG/2ML IJ SOLN
4.0000 mg | Freq: Once | INTRAMUSCULAR | Status: AC
  Administered 2016-11-06: 4 mg via INTRAVENOUS
  Filled 2016-11-06: qty 2

## 2016-11-06 MED ORDER — PROMETHAZINE HCL 25 MG PO TABS: 25.0000 mg | ORAL_TABLET | Freq: Four times a day (QID) | ORAL | 0 refills | Status: DC | PRN

## 2016-11-06 MED ORDER — SODIUM CHLORIDE 0.9 % IV BOLUS (SEPSIS)
1000.0000 mL | Freq: Once | INTRAVENOUS | Status: AC
  Administered 2016-11-06: 1000 mL via INTRAVENOUS

## 2016-11-06 MED ORDER — CEPHALEXIN 500 MG PO CAPS
500.0000 mg | ORAL_CAPSULE | Freq: Once | ORAL | Status: AC
  Administered 2016-11-06: 500 mg via ORAL
  Filled 2016-11-06: qty 1

## 2016-11-06 NOTE — ED Provider Notes (Signed)
[facility] Provider Note   CSN: [id number] Arrival date & time: 11/06/16  1216     History   Chief Complaint Chief Complaint  Patient presents with  . Emesis    HPI Jennifer Ingram is a 66 y.o. female.  Pt presents with n/v since yesterday.  Pt said that she has not been able to eat anything or take her meds since yesterday at lunch.  The pt said that she does not have any pain.  She has not had a bm in 2 days.  She denies f/c.      Past Medical History:  Diagnosis Date  . Depression    on meds, working well  . Diabetes mellitus without complication (HCC)   . Heart palpitations    rapid  . Hypertension     There are no active problems to display for this patient.   Past Surgical History:  Procedure Laterality Date  . CESAREAN SECTION    . CHOLECYSTECTOMY  1985    OB History    Gravida Para Term Preterm AB Living   1 1 1     1    SAB TAB Ectopic Multiple Live Births                   Home Medications    Prior to Admission medications   Medication Sig Start Date End Date Taking? Authorizing Provider  atenolol (TENORMIN) 50 MG tablet Take 1 tablet (50 mg total) by mouth daily. 02/25/15  Yes Kindl, Quita SkyeJames D, MD  gabapentin (NEURONTIN) 300 MG capsule Take 300 mg by mouth at bedtime.   Yes [provider]  Insulin NPH Isophane & Regular (HUMULIN 70/30 [place]) Inject 30 Units into the skin 2 (two) times daily.   Yes [provider]  PARoxetine (PAXIL) 40 MG tablet Take 40 mg by mouth daily after breakfast.   Yes [provider]  cephALEXin (KEFLEX) 500 MG capsule Take 1 capsule (500 mg total) by mouth 4 (four) times daily. 11/06/16   Jacalyn LefevreHaviland, Aislee Landgren, MD  promethazine (PHENERGAN) 25 MG tablet Take 1 tablet (25 mg total) by mouth every 6 (six) hours as needed for nausea or vomiting. 11/06/16   Jacalyn LefevreHaviland, Haniel Fix, MD    Family History Family History  Problem Relation Age of Onset  . Cancer Mother        leukemia  . Cancer Father    lung    Social History Social History  Substance Use Topics  . Smoking status: Never Smoker  . Smokeless tobacco: Never Used  . Alcohol use No     Allergies   Patient has no known allergies.   Review of Systems Review of Systems  Gastrointestinal: Positive for nausea and vomiting.  All other systems reviewed and are negative.    Physical Exam Updated Vital Signs BP (!) 147/84   Pulse 88   Temp 98.3 F (36.8 C) (Oral)   Resp 16   Ht 5' (1.524 m)   Wt 74.8 kg (165 lb)   SpO2 99%   BMI 32.22 kg/m   Physical Exam  Constitutional: She is oriented to person, place, and time. She appears well-developed and well-nourished.  HENT:  Head: Normocephalic and atraumatic.  Right Ear: External ear normal.  Left Ear: External ear normal.  Nose: Nose normal.  Mouth/Throat: Mucous membranes are dry.  Eyes: Conjunctivae and EOM are normal. Pupils are equal, round, and reactive to light.  Neck: Normal range of motion. Neck supple.  Cardiovascular: Normal  rate, regular rhythm, normal heart sounds and intact distal pulses.   Pulmonary/Chest: Effort normal and breath sounds normal.  Abdominal: Soft. Bowel sounds are normal.  Musculoskeletal: Normal range of motion.  Neurological: She is alert and oriented to person, place, and time.  Skin: Skin is warm.  Psychiatric: She has a normal mood and affect. Her behavior is normal. Judgment and thought content normal.  Nursing note and vitals reviewed.    ED Treatments / Results  Labs (all labs ordered are listed, but only abnormal results are displayed) Labs Reviewed  COMPREHENSIVE METABOLIC PANEL - Abnormal; Notable for the following:       Result Value   Glucose, Bld 268 (*)    BUN 29 (*)    Creatinine, Ser 1.07 (*)    Total Protein 8.2 (*)    ALT 13 (*)    GFR calc non Af Amer 54 (*)    All other components within normal limits  CBC - Abnormal; Notable for the following:    WBC 14.4 (*)    All other components within  normal limits  URINALYSIS, ROUTINE W REFLEX MICROSCOPIC - Abnormal; Notable for the following:    APPearance CLOUDY (*)    Glucose, UA >=500 (*)    Ketones, ur 5 (*)    Leukocytes, UA TRACE (*)    Bacteria, UA MANY (*)    Squamous Epithelial / LPF 6-30 (*)    All other components within normal limits  CBG MONITORING, ED - Abnormal; Notable for the following:    Glucose-Capillary 258 (*)    All other components within normal limits  LIPASE, BLOOD    EKG  EKG Interpretation None       Radiology Dg Abdomen Acute W/chest  Result Date: 11/06/2016 CLINICAL DATA:  Nausea and vomiting began earlier today. EXAM: DG ABDOMEN ACUTE W/ 1V CHEST COMPARISON:  None. FINDINGS: There is no evidence of dilated bowel loops or free intraperitoneal air. No radiopaque calculi or other significant radiographic abnormality is seen. Heart size and mediastinal contours are within normal limits. Both lungs are clear. Cholecystectomy clips. Moderate stool burden. IMPRESSION: Negative abdominal radiographs.  No acute cardiopulmonary disease. Electronically Signed   By: Elsie Stain M.D.   On: 11/06/2016 18:09    Procedures Procedures (including critical care time)  Medications Ordered in ED Medications  cephALEXin (KEFLEX) capsule 500 mg (not administered)  sodium chloride 0.9 % bolus 1,000 mL (1,000 mLs Intravenous New Bag/Given 11/06/16 1658)  ondansetron (ZOFRAN) injection 4 mg (4 mg Intravenous Given 11/06/16 1658)     Initial Impression / Assessment and Plan / ED Course  I have reviewed the triage vital signs and the nursing notes.  Pertinent labs & imaging results that were available during my care of the patient were reviewed by me and considered in my medical decision making (see chart for details).    Pt is feeling much better.  She knows to return if worse.  Final Clinical Impressions(s) / ED Diagnoses   Final diagnoses:  Non-intractable vomiting with nausea, unspecified vomiting type    Acute cystitis without hematuria  Hyperglycemia    New Prescriptions New Prescriptions   CEPHALEXIN (KEFLEX) 500 MG CAPSULE    Take 1 capsule (500 mg total) by mouth 4 (four) times daily.   PROMETHAZINE (PHENERGAN) 25 MG TABLET    Take 1 tablet (25 mg total) by mouth every 6 (six) hours as needed for nausea or vomiting.     Jacalyn Lefevre, MD 11/06/16 [phone]

## 2016-11-06 NOTE — ED Triage Notes (Signed)
Pt here with emesis since yesterday.  No abdominal pain.  Diabetic.  Will check cbg

## 2020-06-08 ENCOUNTER — Ambulatory Visit (HOSPITAL_COMMUNITY)
Admission: EM | Admit: 2020-06-08 | Discharge: 2020-06-08 | Disposition: A | Discharge status: Home / Self Care | Payer: Self-pay | Attending: Student | Admitting: Student

## 2020-06-08 ENCOUNTER — Other Ambulatory Visit: Payer: Self-pay

## 2020-06-08 ENCOUNTER — Encounter (HOSPITAL_COMMUNITY): Payer: Self-pay | Admitting: Emergency Medicine

## 2020-06-08 DIAGNOSIS — E1142 Type 2 diabetes mellitus with diabetic polyneuropathy: Secondary | ICD-10-CM

## 2020-06-08 DIAGNOSIS — Z760 Encounter for issue of repeat prescription: Secondary | ICD-10-CM

## 2020-06-08 DIAGNOSIS — I10 Essential (primary) hypertension: Secondary | ICD-10-CM

## 2020-06-08 DIAGNOSIS — Z794 Long term (current) use of insulin: Secondary | ICD-10-CM

## 2020-06-08 MED ORDER — ATENOLOL 50 MG PO TABS: 50.0000 mg | ORAL_TABLET | Freq: Two times a day (BID) | ORAL | 2 refills | Status: DC

## 2020-06-08 MED ORDER — GABAPENTIN 300 MG PO CAPS: 300.0000 mg | ORAL_CAPSULE | Freq: Two times a day (BID) | ORAL | 2 refills | Status: DC

## 2020-06-08 NOTE — ED Provider Notes (Signed)
[facility]    CSN: [id number] Arrival date & time: 06/08/20  0945      History   Chief Complaint Chief Complaint  Patient presents with  . Medication Refill    HPI Jennifer Ingram is a 70 y.o. female presenting for medication refill.  History of diabetes, heart ablations, hypertension, depression. she states that she is unable to go to her primary care due to having Medicare insurance.  She states that she is taking atenolol for hypertension for years and her blood pressure is well controlled on this medication.  She states that she also takes gabapentin for peripheral neuropathy due to diabetes, and her neuropathy is well controlled on this medication.  Denies chest pain, shortness of breath, dizziness, weakness in arms or legs, new sensation changes in feet  HPI  Past Medical History:  Diagnosis Date  . Depression    on meds, working well  . Diabetes mellitus without complication (HCC)   . Heart palpitations    rapid  . Hypertension     There are no problems to display for this patient.   Past Surgical History:  Procedure Laterality Date  . CESAREAN SECTION    . CHOLECYSTECTOMY  1985    OB History    Gravida  1   Para  1   Term  1   Preterm      AB      Living  1     SAB      IAB      Ectopic      Multiple      Live Births               Home Medications    Prior to Admission medications   Medication Sig Start Date End Date Taking? Authorizing Provider  atenolol (TENORMIN) 50 MG tablet Take 1 tablet (50 mg total) by mouth 2 (two) times daily. 06/08/20 09/06/20 Yes Rhys Martini, PA-C  gabapentin (NEURONTIN) 300 MG capsule Take 1 capsule (300 mg total) by mouth 2 (two) times daily. 06/08/20 09/06/20 Yes Rhys Martini, PA-C  cephALEXin (KEFLEX) 500 MG capsule Take 1 capsule (500 mg total) by mouth 4 (four) times daily. 11/06/16   Jacalyn Lefevre, MD  Insulin NPH Isophane & Regular (HUMULIN 70/30 [place]) Inject 30 Units into the skin 2 (two)  times daily.    [provider]  PARoxetine (PAXIL) 40 MG tablet Take 40 mg by mouth daily after breakfast.    [provider]  promethazine (PHENERGAN) 25 MG tablet Take 1 tablet (25 mg total) by mouth every 6 (six) hours as needed for nausea or vomiting. 11/06/16   Jacalyn Lefevre, MD    Family History Family History  Problem Relation Age of Onset  . Cancer Mother        leukemia  . Cancer Father        lung    Social History Social History   Tobacco Use  . Smoking status: Never Smoker  . Smokeless tobacco: Never Used  Substance Use Topics  . Alcohol use: No  . Drug use: No     Allergies   Patient has no known allergies.   Review of Systems Review of Systems  All other systems reviewed and are negative.    Physical Exam Triage Vital Signs ED Triage Vitals  Enc Vitals Group     BP 06/08/20 1051 136/78     Pulse Rate 06/08/20 1051 76  Resp 06/08/20 1051 18     Temp 06/08/20 1051 98.2 F (36.8 C)     Temp Source 06/08/20 1051 Oral     SpO2 06/08/20 1051 97 %     Weight --      Height --      Head Circumference --      Peak Flow --      Pain Score 06/08/20 1049 0     Pain Loc --      Pain Edu? --      Excl. in GC? --    No data found.  Updated Vital Signs BP 136/78 (BP Location: Right Arm)   Pulse 76   Temp 98.2 F (36.8 C) (Oral)   Resp 18   SpO2 97%   Visual Acuity Right Eye Distance:   Left Eye Distance:   Bilateral Distance:    Right Eye Near:   Left Eye Near:    Bilateral Near:     Physical Exam Vitals reviewed.  Constitutional:      Appearance: Normal appearance.  HENT:     Head: Normocephalic and atraumatic.  Neurological:     General: No focal deficit present.     Mental Status: She is alert and oriented to person, place, and time.  Psychiatric:        Mood and Affect: Mood normal.        Behavior: Behavior normal.        Thought Content: Thought content normal.        Judgment: Judgment normal.       UC Treatments / Results  Labs (all labs ordered are listed, but only abnormal results are displayed) Labs Reviewed - No data to display  EKG   Radiology No results found.  Procedures Procedures (including critical care time)  Medications Ordered in UC Medications - No data to display  Initial Impression / Assessment and Plan / UC Course  I have reviewed the triage vital signs and the nursing notes.  Pertinent labs & imaging results that were available during my care of the patient were reviewed by me and considered in my medical decision making (see chart for details).    Blood pressure is at goal on atenolol twice daily.  Peripheral neuropathy well controlled on gabapentin per pt.  Gabapentin and atenolol refilled as below. She has been taking these medications for years without issue. For diabetes, continue insulin as directed. Continue monitoring sugars at home.  She plans to establish care with new PCP soon.   Return precautions- chest pain, shortness of breath, new/worsening fevers/chills, confusion, worsening of symptoms despite the above treatment plan, etc.   Spent over 40 minutes obtaining H&P, performing physical, discussing results, treatment plan and plan for follow-up with patient. Patient agrees with plan.   This chart was dictated using voice recognition software, Dragon. Despite the best efforts of this provider to proofread and correct errors, errors may still occur which can change documentation meaning.   Final Clinical Impressions(s) / UC Diagnoses   Final diagnoses:  Medication refill  Essential hypertension  Type 2 diabetes mellitus with diabetic polyneuropathy, with long-term current use of insulin (HCC)     Discharge Instructions     -I refilled your atenolol and gabapentin today. Continue these as directed. -Continue insulin and monitoring sugars. -Please establish care with new PCP for further refills.   ED Prescriptions     Medication Sig Dispense Auth. Provider   atenolol (TENORMIN) 50 MG tablet Take 1 tablet (  50 mg total) by mouth 2 (two) times daily. 60 tablet Rhys Martini, PA-C   gabapentin (NEURONTIN) 300 MG capsule Take 1 capsule (300 mg total) by mouth 2 (two) times daily. 60 capsule Rhys Martini, PA-C     PDMP not reviewed this encounter.   Rhys Martini, PA-C 06/08/20 1227

## 2020-06-08 NOTE — Discharge Instructions (Signed)
-  I refilled your atenolol and gabapentin today. Continue these as directed. -Continue insulin and monitoring sugars. -Please establish care with new PCP for further refills.

## 2020-06-08 NOTE — ED Triage Notes (Signed)
Pt presents for medication refill. States unable to go to clinic now after getting [employer].   Needs refills on Atenolol and Gabapentin.

## 2020-09-10 ENCOUNTER — Ambulatory Visit (HOSPITAL_COMMUNITY)
Admission: EM | Admit: 2020-09-10 | Discharge: 2020-09-10 | Disposition: A | Discharge status: Home / Self Care | Payer: Medicare Other | Attending: Family Medicine | Admitting: Family Medicine

## 2020-09-10 ENCOUNTER — Other Ambulatory Visit: Payer: Self-pay

## 2020-09-10 ENCOUNTER — Encounter (HOSPITAL_COMMUNITY): Payer: Self-pay

## 2020-09-10 DIAGNOSIS — I10 Essential (primary) hypertension: Secondary | ICD-10-CM | POA: Insufficient documentation

## 2020-09-10 DIAGNOSIS — E039 Hypothyroidism, unspecified: Secondary | ICD-10-CM | POA: Insufficient documentation

## 2020-09-10 LAB — COMPREHENSIVE METABOLIC PANEL
ALT: 14 U/L (ref 0–44)
AST: 17 U/L (ref 15–41)
Albumin: 3.7 g/dL (ref 3.5–5.0)
Alkaline Phosphatase: 73 U/L (ref 38–126)
Anion gap: 5 (ref 5–15)
BUN: 19 mg/dL (ref 8–23)
CO2: 29 mmol/L (ref 22–32)
Calcium: 9.1 mg/dL (ref 8.9–10.3)
Chloride: 106 mmol/L (ref 98–111)
Creatinine, Ser: 1.18 mg/dL — ABNORMAL HIGH (ref 0.44–1.00)
GFR, Estimated: 50 mL/min — ABNORMAL LOW (ref >60)
Glucose, Bld: 85 mg/dL (ref 70–99)
Potassium: 3.7 mmol/L (ref 3.5–5.1)
Sodium: 140 mmol/L (ref 135–145)
Total Bilirubin: 0.3 mg/dL (ref 0.3–1.2)
Total Protein: 7.4 g/dL (ref 6.5–8.1)

## 2020-09-10 LAB — TSH: TSH: 7.679 u[IU]/mL — ABNORMAL HIGH (ref 0.350–4.500)

## 2020-09-10 MED ORDER — EUTHYROX 50 MCG PO TABS: 50.0000 ug | ORAL_TABLET | Freq: Every day | ORAL | 0 refills | Status: DC

## 2020-09-10 MED ORDER — ATENOLOL 50 MG PO TABS: 50.0000 mg | ORAL_TABLET | Freq: Two times a day (BID) | ORAL | 0 refills | Status: DC

## 2020-09-10 NOTE — ED Triage Notes (Signed)
Pt in requesting med refill on her atenolol and euthyrox

## 2020-09-10 NOTE — ED Provider Notes (Signed)
[facility]    CSN: [id number] Arrival date & time: 09/10/20  1041      History   Chief Complaint Chief Complaint  Patient presents with  . Medication Refill    HPI Jennifer Ingram is a 70 y.o. female.   Patient presenting today requesting medication refills on her atenolol and her Euthyrox.  She states she ran out of these 1 day ago but prior to that was taking them consistently without side effect or concern.  Home blood pressures when he consistently taking them are 120s to 130s over 80s and she denies any chest pain, shortness of breath, headaches, dizziness.  Does not currently have a primary care provider as her insurance changed and it was no longer taken by her old primary care.  Interested in establishing care with 1 soon as possible.     Past Medical History:  Diagnosis Date  . Depression    on meds, working well  . Diabetes mellitus without complication (HCC)   . Heart palpitations    rapid  . Hypertension     There are no problems to display for this patient.   Past Surgical History:  Procedure Laterality Date  . CESAREAN SECTION    . CHOLECYSTECTOMY  1985    OB History    Gravida  1   Para  1   Term  1   Preterm      AB      Living  1     SAB      IAB      Ectopic      Multiple      Live Births               Home Medications    Prior to Admission medications   Medication Sig Start Date End Date Taking? Authorizing Provider  atenolol (TENORMIN) 50 MG tablet Take 1 tablet (50 mg total) by mouth 2 (two) times daily. 09/10/20 12/09/20  Particia Nearing, PA-C  cephALEXin (KEFLEX) 500 MG capsule Take 1 capsule (500 mg total) by mouth 4 (four) times daily. 11/06/16   Jacalyn Lefevre, MD  EUTHYROX 50 MCG tablet Take 1 tablet (50 mcg total) by mouth daily. 09/10/20   Particia Nearing, PA-C  gabapentin (NEURONTIN) 300 MG capsule Take 1 capsule (300 mg total) by mouth 2 (two) times daily. 06/08/20 09/06/20  Rhys Martini, PA-C  Insulin NPH Isophane & Regular (HUMULIN 70/30 [place]) Inject 30 Units into the skin 2 (two) times daily.    [provider]  PARoxetine (PAXIL) 40 MG tablet Take 40 mg by mouth daily after breakfast.    [provider]  promethazine (PHENERGAN) 25 MG tablet Take 1 tablet (25 mg total) by mouth every 6 (six) hours as needed for nausea or vomiting. 11/06/16   Jacalyn Lefevre, MD    Family History Family History  Problem Relation Age of Onset  . Cancer Mother        leukemia  . Cancer Father        lung    Social History Social History   Tobacco Use  . Smoking status: Never Smoker  . Smokeless tobacco: Never Used  Substance Use Topics  . Alcohol use: No  . Drug use: No     Allergies   Patient has no known allergies.   Review of Systems Review of Systems Per HPI Physical Exam Triage Vital Signs ED Triage Vitals  Enc Vitals Group  BP 09/10/20 1205 (!) 148/89     Pulse Rate 09/10/20 1205 79     Resp --      Temp 09/10/20 1205 97.8 F (36.6 C)     Temp Source 09/10/20 1205 Oral     SpO2 09/10/20 1205 100 %     Weight --      Height --      Head Circumference --      Peak Flow --      Pain Score 09/10/20 1204 0     Pain Loc --      Pain Edu? --      Excl. in GC? --    No data found.  Updated Vital Signs BP (!) 148/89 (BP Location: Right Arm)   Pulse 79   Temp 97.8 F (36.6 C) (Oral)   SpO2 100%   Visual Acuity Right Eye Distance:   Left Eye Distance:   Bilateral Distance:    Right Eye Near:   Left Eye Near:    Bilateral Near:     Physical Exam Vitals and nursing note reviewed.  Constitutional:      Appearance: Normal appearance. She is not ill-appearing.  HENT:     Head: Atraumatic.     Mouth/Throat:     Mouth: Mucous membranes are moist.     Pharynx: Oropharynx is clear.  Eyes:     Extraocular Movements: Extraocular movements intact.     Conjunctiva/sclera: Conjunctivae normal.  Cardiovascular:     Rate and Rhythm:  Normal rate and regular rhythm.     Heart sounds: Normal heart sounds.  Pulmonary:     Effort: Pulmonary effort is normal.     Breath sounds: Normal breath sounds.  Musculoskeletal:        General: Normal range of motion.     Cervical back: Normal range of motion and neck supple.  Skin:    General: Skin is warm and dry.  Neurological:     General: No focal deficit present.     Mental Status: She is alert and oriented to person, place, and time.     Cranial Nerves: No cranial nerve deficit.     Motor: No weakness.     Gait: Gait normal.  Psychiatric:        Mood and Affect: Mood normal.        Thought Content: Thought content normal.        Judgment: Judgment normal.    UC Treatments / Results  Labs (all labs ordered are listed, but only abnormal results are displayed) Labs Reviewed  COMPREHENSIVE METABOLIC PANEL  TSH    EKG   Radiology No results found.  Procedures Procedures (including critical care time)  Medications Ordered in UC Medications - No data to display  Initial Impression / Assessment and Plan / UC Course  I have reviewed the triage vital signs and the nursing notes.  Pertinent labs & imaging results that were available during my care of the patient were reviewed by me and considered in my medical decision making (see chart for details).     Examined and vitals overall reassuring, blood pressure mildly elevated today but this is to be expected as she ran out of her medication.  Will obtain basic labs today for safety check and refill her medications as currently dosed.  PCP assistance initiated for patient to help obtain follow-up.   Final Clinical Impressions(s) / UC Diagnoses   Final diagnoses:  Essential hypertension  Acquired hypothyroidism  Discharge Instructions   None    ED Prescriptions    Medication Sig Dispense Auth. Provider   EUTHYROX 50 MCG tablet Take 1 tablet (50 mcg total) by mouth daily. 90 tablet Roosvelt Maser [place],  [place]   atenolol (TENORMIN) 50 MG tablet Take 1 tablet (50 mg total) by mouth 2 (two) times daily. 180 tablet Particia Nearing, [place]     PDMP not reviewed this encounter.   Particia Nearing, [place] 09/10/20 1251

## 2020-09-19 ENCOUNTER — Encounter: Payer: Self-pay | Admitting: *Deleted

## 2020-12-08 ENCOUNTER — Encounter (HOSPITAL_COMMUNITY): Payer: Self-pay

## 2020-12-08 ENCOUNTER — Other Ambulatory Visit: Payer: Self-pay

## 2020-12-08 ENCOUNTER — Ambulatory Visit (HOSPITAL_COMMUNITY)
Admission: RE | Admit: 2020-12-08 | Discharge: 2020-12-08 | Disposition: A | Discharge status: Home / Self Care | Payer: Medicare Other | Source: Ambulatory Visit | Attending: Emergency Medicine | Admitting: Emergency Medicine

## 2020-12-08 VITALS — BP 120/69 | HR 101 | Temp 98.1°F | Resp 21

## 2020-12-08 DIAGNOSIS — Z760 Encounter for issue of repeat prescription: Secondary | ICD-10-CM

## 2020-12-08 DIAGNOSIS — I10 Essential (primary) hypertension: Secondary | ICD-10-CM | POA: Diagnosis not present

## 2020-12-08 MED ORDER — PAROXETINE HCL 40 MG PO TABS: 40.0000 mg | ORAL_TABLET | Freq: Every day | ORAL | 0 refills | Status: DC

## 2020-12-08 MED ORDER — ATENOLOL 50 MG PO TABS: 50.0000 mg | ORAL_TABLET | Freq: Two times a day (BID) | ORAL | 0 refills | Status: DC

## 2020-12-08 NOTE — Discharge Instructions (Addendum)
You will need to get any additional refills from a primary care provider.   It is very important to get established with a primary care provider for long term management of health problems, including diabetes and high blood pressure.

## 2020-12-08 NOTE — ED Triage Notes (Signed)
Pt requested atenolol, Paxil and Novolog refill.

## 2020-12-08 NOTE — ED Provider Notes (Signed)
[facility]    CSN: [id number] Arrival date & time: 12/08/20  1040      History   Chief Complaint Chief Complaint  Patient presents with   Appointment   Medication Refill    HPI Jennifer Ingram is a 70 y.o. female.   Patient here for medication refill of atenolol, paxil, and novolog.  Reports previously getting refills at [place] health clinic [place] until she was able to get her medicare.  Reports that she has taken these medications for years with no problems.  Reports trying to get set up with a PCP but that the person who makes appointments for "new patients" is on vacation until Monday.  Denies any trauma, injury, or other precipitating event.  Denies any specific alleviating or aggravating factors.  Denies any fevers, chest pain, shortness of breath, N/V/D, numbness, tingling, weakness, abdominal pain, or headaches.    The history is provided by the patient.  Medication Refill Medications taken before: yes - see home medications   Patient has complete original prescription information: no (unable to obtain recent Novolog prescription and patient did not bring in old medication)    Past Medical History:  Diagnosis Date   Depression    on meds, working well   Diabetes mellitus without complication (HCC)    Heart palpitations    rapid   Hypertension     There are no problems to display for this patient.   Past Surgical History:  Procedure Laterality Date   CESAREAN SECTION     CHOLECYSTECTOMY  1985    OB History     Gravida  1   Para  1   Term  1   Preterm      AB      Living  1      SAB      IAB      Ectopic      Multiple      Live Births               Home Medications    Prior to Admission medications   Medication Sig Start Date End Date Taking? Authorizing Provider  atenolol (TENORMIN) 50 MG tablet Take 1 tablet (50 mg total) by mouth 2 (two) times daily for 14 days. 12/08/20 12/22/20  Ivette Loyal, NP  cephALEXin  (KEFLEX) 500 MG capsule Take 1 capsule (500 mg total) by mouth 4 (four) times daily. 11/06/16   Jacalyn Lefevre, MD  EUTHYROX 50 MCG tablet Take 1 tablet (50 mcg total) by mouth daily. 09/10/20   Particia Nearing, PA-C  gabapentin (NEURONTIN) 300 MG capsule Take 1 capsule (300 mg total) by mouth 2 (two) times daily. 06/08/20 09/06/20  Rhys Martini, PA-C  Insulin NPH Isophane & Regular (HUMULIN 70/30 [place]) Inject 30 Units into the skin 2 (two) times daily.    [provider]  PARoxetine (PAXIL) 40 MG tablet Take 1 tablet (40 mg total) by mouth daily after breakfast. 12/08/20   Ivette Loyal, NP  promethazine (PHENERGAN) 25 MG tablet Take 1 tablet (25 mg total) by mouth every 6 (six) hours as needed for nausea or vomiting. 11/06/16   Jacalyn Lefevre, MD    Family History Family History  Problem Relation Age of Onset   Cancer Mother        leukemia   Cancer Father        lung    Social History Social History   Tobacco Use   Smoking  status: Never   Smokeless tobacco: Never  Substance Use Topics   Alcohol use: No   Drug use: No     Allergies   Patient has no known allergies.   Review of Systems Review of Systems  All other systems reviewed and are negative.   Physical Exam Triage Vital Signs ED Triage Vitals  Enc Vitals Group     BP 12/08/20 1102 120/69     Pulse Rate 12/08/20 1102 (!) 101     Resp 12/08/20 1102 (!) 21     Temp 12/08/20 1102 98.1 F (36.7 C)     Temp src --      SpO2 12/08/20 1102 98 %     Weight --      Height --      Head Circumference --      Peak Flow --      Pain Score 12/08/20 1103 0     Pain Loc --      Pain Edu? --      Excl. in GC? --    No data found.  Updated Vital Signs BP 120/69 (BP Location: Right Arm)   Pulse (!) 101   Temp 98.1 F (36.7 C)   Resp (!) 21   SpO2 98%   Visual Acuity Right Eye Distance:   Left Eye Distance:   Bilateral Distance:    Right Eye Near:   Left Eye Near:    Bilateral Near:      Physical Exam Vitals and nursing note reviewed.  Constitutional:      General: She is not in acute distress.    Appearance: Normal appearance. She is not ill-appearing, toxic-appearing or diaphoretic.  HENT:     Head: Normocephalic and atraumatic.  Eyes:     Conjunctiva/sclera: Conjunctivae normal.  Cardiovascular:     Rate and Rhythm: Normal rate and regular rhythm.     Pulses: Normal pulses.     Heart sounds: Normal heart sounds.  Pulmonary:     Effort: Pulmonary effort is normal.     Breath sounds: Normal breath sounds.  Abdominal:     General: Abdomen is flat.  Musculoskeletal:        General: Normal range of motion.     Cervical back: Normal range of motion.  Skin:    General: Skin is warm and dry.  Neurological:     General: No focal deficit present.     Mental Status: She is alert and oriented to person, place, and time.  Psychiatric:        Mood and Affect: Mood normal.     UC Treatments / Results  Labs (all labs ordered are listed, but only abnormal results are displayed) Labs Reviewed - No data to display  EKG   Radiology No results found.  Procedures Procedures (including critical care time)  Medications Ordered in UC Medications - No data to display  Initial Impression / Assessment and Plan / UC Course  I have reviewed the triage vital signs and the nursing notes.  Pertinent labs & imaging results that were available during my care of the patient were reviewed by me and considered in my medical decision making (see chart for details).    Assessment negative for red flags or concerns.  BP well controlled at this time with a BP of 120/69 in office.  Unable to fill insulin prescription as I was not able to verify previous dose.  Will refill atenolol and paxil for 2 weeks.  Patient  instructed that she will need to go to a primary care for any future refills.  Patient verbalized understanding.  Final Clinical Impressions(s) / UC Diagnoses   Final  diagnoses:  Medication refill  Hypertension, unspecified type     Discharge Instructions      You will need to get any additional refills from a primary care provider.   It is very important to get established with a primary care provider for long term management of health problems, including diabetes and high blood pressure.      ED Prescriptions     Medication Sig Dispense Auth. Provider   PARoxetine (PAXIL) 40 MG tablet Take 1 tablet (40 mg total) by mouth daily after breakfast. 14 tablet Ivette Loyal, NP   atenolol (TENORMIN) 50 MG tablet Take 1 tablet (50 mg total) by mouth 2 (two) times daily for 14 days. 28 tablet Ivette Loyal, NP      PDMP not reviewed this encounter.   Ivette Loyal, NP 12/08/20 [phone]

## 2020-12-17 ENCOUNTER — Ambulatory Visit (INDEPENDENT_AMBULATORY_CARE_PROVIDER_SITE_OTHER): Payer: Medicare Other | Admitting: Internal Medicine

## 2020-12-17 ENCOUNTER — Other Ambulatory Visit: Payer: Self-pay

## 2020-12-17 ENCOUNTER — Encounter: Payer: Self-pay | Admitting: Internal Medicine

## 2020-12-17 ENCOUNTER — Ambulatory Visit (HOSPITAL_COMMUNITY)
Admission: RE | Admit: 2020-12-17 | Discharge: 2020-12-17 | Disposition: A | Discharge status: Home / Self Care | Payer: Medicare Other | Source: Ambulatory Visit | Attending: Internal Medicine | Admitting: Internal Medicine

## 2020-12-17 VITALS — BP 135/90 | HR 87 | Temp 98.3°F | Resp 20 | Ht 59.0 in | Wt 160.7 lb

## 2020-12-17 DIAGNOSIS — I10 Essential (primary) hypertension: Secondary | ICD-10-CM | POA: Insufficient documentation

## 2020-12-17 DIAGNOSIS — Z0000 Encounter for general adult medical examination without abnormal findings: Secondary | ICD-10-CM | POA: Diagnosis not present

## 2020-12-17 DIAGNOSIS — Z1211 Encounter for screening for malignant neoplasm of colon: Secondary | ICD-10-CM

## 2020-12-17 DIAGNOSIS — N189 Chronic kidney disease, unspecified: Secondary | ICD-10-CM

## 2020-12-17 DIAGNOSIS — Z1231 Encounter for screening mammogram for malignant neoplasm of breast: Secondary | ICD-10-CM

## 2020-12-17 DIAGNOSIS — E039 Hypothyroidism, unspecified: Secondary | ICD-10-CM | POA: Insufficient documentation

## 2020-12-17 DIAGNOSIS — F32A Depression, unspecified: Secondary | ICD-10-CM

## 2020-12-17 DIAGNOSIS — R002 Palpitations: Secondary | ICD-10-CM | POA: Diagnosis not present

## 2020-12-17 DIAGNOSIS — E785 Hyperlipidemia, unspecified: Secondary | ICD-10-CM | POA: Insufficient documentation

## 2020-12-17 DIAGNOSIS — E1142 Type 2 diabetes mellitus with diabetic polyneuropathy: Secondary | ICD-10-CM | POA: Insufficient documentation

## 2020-12-17 DIAGNOSIS — E119 Type 2 diabetes mellitus without complications: Secondary | ICD-10-CM | POA: Diagnosis not present

## 2020-12-17 DIAGNOSIS — L304 Erythema intertrigo: Secondary | ICD-10-CM

## 2020-12-17 DIAGNOSIS — E1342 Other specified diabetes mellitus with diabetic polyneuropathy: Secondary | ICD-10-CM

## 2020-12-17 DIAGNOSIS — R7989 Other specified abnormal findings of blood chemistry: Secondary | ICD-10-CM

## 2020-12-17 LAB — POCT GLYCOSYLATED HEMOGLOBIN (HGB A1C): HbA1c POC (<> result, manual entry): >14 % — AB (ref 4.0–5.6)

## 2020-12-17 LAB — GLUCOSE, CAPILLARY: Glucose-Capillary: 369 mg/dL — ABNORMAL HIGH (ref 70–99)

## 2020-12-17 MED ORDER — GABAPENTIN 300 MG PO CAPS: 300.0000 mg | ORAL_CAPSULE | Freq: Two times a day (BID) | ORAL | 2 refills | Status: DC

## 2020-12-17 MED ORDER — EUTHYROX 50 MCG PO TABS: 50.0000 ug | ORAL_TABLET | Freq: Every day | ORAL | 0 refills | Status: DC

## 2020-12-17 MED ORDER — NYSTATIN 100000 UNIT/GM EX POWD: 1.0000 "application " | Freq: Three times a day (TID) | CUTANEOUS | 0 refills | Status: DC

## 2020-12-17 MED ORDER — SIMVASTATIN 20 MG PO TABS: 20.0000 mg | ORAL_TABLET | Freq: Every day | ORAL | 3 refills | Status: DC

## 2020-12-17 MED ORDER — HUMULIN 70/30 (70-30) 100 UNIT/ML ~~LOC~~ SUSP: 40.0000 [IU] | Freq: Two times a day (BID) | SUBCUTANEOUS | 3 refills | Status: DC

## 2020-12-17 MED ORDER — ATENOLOL 50 MG PO TABS: 50.0000 mg | ORAL_TABLET | Freq: Two times a day (BID) | ORAL | 0 refills | Status: DC

## 2020-12-17 MED ORDER — ZOSTER VAC RECOMB ADJUVANTED 50 MCG/0.5ML IM SUSR: 0.5000 mL | Freq: Once | INTRAMUSCULAR | 0 refills | Status: AC

## 2020-12-17 MED ORDER — PAROXETINE HCL 40 MG PO TABS: 40.0000 mg | ORAL_TABLET | Freq: Every day | ORAL | 0 refills | Status: DC

## 2020-12-17 NOTE — Assessment & Plan Note (Signed)
-  Referral for mammogram.  Previous mammogram completed at [facility] 2 years ago, no abnormalities at that time. -Shingrix vaccine prescription given, instructed patient to take this to her pharmacy. -Referral to GI for colonoscopy -She states that she received Tdap within last 10 years.

## 2020-12-17 NOTE — Patient Instructions (Addendum)
[name], it was a pleasure seeing you today!  Today we discussed: Diabetes- today we are getting some blood work to monitor your diabetes management.  I have sent in refills for Novolin and gabapentin to your pharmacy.  For the Continuous Glucose Monitor, I have referred you to our diabetes/ dietician for help with getting that set up.  Also have referred you to a foot doctor or podiatrist. I also has you give a urine sample to check that your kidneys are doing well in the setting of your diabetes. I would like to see you in one month   I am also getting blood work to check your thyroid, cholesterol, and other electrolytes.  I will call you back with those results next week.  Please take the shingrix prescription to your pharmacy to receive the vaccine.  Referral for Colonoscopy and mammogram.  I have also sent in nystatin powder to help with the rash on your chest.    I have ordered the following labs today:  Lab Orders         T4, Free         TSH         BMP8+Anion Gap         Lipid Profile         Microalbumin / Creatinine Urine Ratio         POC Hbg A1C       Tests ordered today:  TSH, T4- thyroid BMP- high blood pressure Lipid profile- cholesterol Diabetes- A1c, microalbumin  Referrals ordered today:   Referral Orders         Ambulatory referral to Podiatry         Ambulatory referral to Gastroenterology         Referral to Nutrition and Diabetes Services       I have ordered the following medication/changed the following medications:   Stop the following medications: Medications Discontinued During This Encounter  Medication Reason   cephALEXin (KEFLEX) 500 MG capsule Completed Course   Insulin NPH Isophane & Regular (HUMULIN 70/30 [place]) Reorder   gabapentin (NEURONTIN) 300 MG capsule Reorder   EUTHYROX 50 MCG tablet Reorder   PARoxetine (PAXIL) 40 MG tablet Reorder   atenolol (TENORMIN) 50 MG tablet Reorder   simvastatin (ZOCOR) 10 MG tablet Reorder      Start the following medications: Meds ordered this encounter  Medications   insulin NPH-regular Human (HUMULIN 70/30) (70-30) 100 UNIT/ML injection    Sig: Inject 40 Units into the skin 2 (two) times daily.    Dispense:  10 mL    Refill:  3   gabapentin (NEURONTIN) 300 MG capsule    Sig: Take 1 capsule (300 mg total) by mouth 2 (two) times daily.    Dispense:  60 capsule    Refill:  2   PARoxetine (PAXIL) 40 MG tablet    Sig: Take 1 tablet (40 mg total) by mouth daily after breakfast.    Dispense:  14 tablet    Refill:  0   EUTHYROX 50 MCG tablet    Sig: Take 1 tablet (50 mcg total) by mouth daily.    Dispense:  90 tablet    Refill:  0   atenolol (TENORMIN) 50 MG tablet    Sig: Take 1 tablet (50 mg total) by mouth 2 (two) times daily for 14 days.    Dispense:  28 tablet    Refill:  0   simvastatin (ZOCOR) 20 MG tablet  Sig: Take 1 tablet (20 mg total) by mouth daily.    Dispense:  30 tablet    Refill:  3   Zoster Vaccine Adjuvanted [facility]) injection    Sig: Inject 0.5 mLs into the muscle once for 1 dose.    Dispense:  0.5 mL    Refill:  0      Follow-up: 3 months   Please make sure to arrive 15 minutes prior to your next appointment. If you arrive late, you may be asked to reschedule.   We look forward to seeing you next time. Please call our clinic at [phone] if you have any questions or concerns. The best time to call is Monday-Friday from 9am-4pm, but there is someone available 24/7. If after hours or the weekend, call the main hospital number and ask for the Internal Medicine Resident On-Call. If you need medication refills, please notify your pharmacy one week in advance and they will send [place] a request.  Thank you for letting us take part in your care. Wishing you the best!  Thank you, Dr. [facility]

## 2020-12-17 NOTE — Assessment & Plan Note (Addendum)
Patient presents on simvastatin 20 mg.  She states that she was following with a non-profit clinic in the area and was told her cholesterol was high years ago.  Unsure of previous lipid panel as non-profit is not within the [facility] healthcare system and not on CareEverywhere. Plan: -refilled simvastatin 20 mg -Ordered lipid panel.  In setting of diabetes, patient will need to stay on statin.   Addendum 12/20/20: ASCVD 18.9%, will change Simvastatin 20 mg to Rosuvastatin 20 mg Lipid Panel     Component Value Date/Time   CHOL 201 (H) 12/17/2020 1103   TRIG 277 (H) 12/17/2020 1103   HDL 28 (L) 12/17/2020 1103   CHOLHDL 7.2 (H) 12/17/2020 1103   LDLCALC 124 (H) 12/17/2020 1103   LABVLDL 49 (H) 12/17/2020 1103

## 2020-12-17 NOTE — Assessment & Plan Note (Addendum)
Patient presents with history of diabetes for the last few years, was following with non-profit clinic locally.  She is on Humulin 70/30 40 units BID.  She states that she checks her blood sugar twice daily, it is usually around 125.  Previously has been on Metformin through non-profit clinic, but patient states she was taken off medication and told it was not working for her.  She is unsure of what her last A1c was, but she remembers it was high at the non-profit clinic when she was there last year. Patient was told she was no longer able to go to non-profit clinic and since then has been going to Urgent care for medication refills.  Patient describes checking blood sugar twice daily but did not bring in log with her.  Her A1c today was >14, which is concerning that she may not be taking her blood sugar regularly due to her reporting her average each day is 125.  She would also benefit from addition of oral medications to her insulin regimen.  Will have patient follow-up sooner due to A1c result after visit ended.  Patient also expressed interest in getting a continuous glucose monitor and I agree that this would be a great idea for this patient.  Plan:  -A1c>14 today with BG=369 -Follow-up in 1-2 weeks for discussion of blood glucose control and diabetic regimen.   -refilled Gabapentin 300mg  BID, refilled Humulin  -referral to podiatry -microalbumin/creatinine urine ratio -referral to dietician for help with acquiring CGM  Addendum: -Called patient and reviewed results.  She states that she had actually been taking one dose of Humulin because she was almost out of her medications. -Will initiate Empagliflozin 10 mg, metformin contraindicated in patient due to GFR=38 -Follow-up in 2 weeks to review blood glucose log and addition of Empagliflozin -Patient endorses difficulty checking blood sugar twice daily.

## 2020-12-17 NOTE — Assessment & Plan Note (Signed)
Patient presents with history of depression.  She states that she was started on Paxil and was receiving therapy through the non-profit clinic she was going to.  At this time, patient feels that her depression is well-controlled on her medications. Educated patient that we also have therapy avaliable here if she was interested in that.  She declined for now, but said she would let the clinic now if she felt that she would like to go. Plan:  -refilled paroxetine 40 mg

## 2020-12-17 NOTE — Assessment & Plan Note (Signed)
Patient presents with history of hypothyroidism.  She is currently on Euthyrox 50 mcg.  She states that she began taking medications for this about one year ago through the non-profit clinic she was following with. Her last TSH= 7.7 is from 05/22 at an urgent care.  Patient states that she had not noticed symptoms at the time that the medication was started.  She has had about a 5 lb weight loss since 2018. Denies constipation, hair or nail changes.   Plan: -TSH, free T4 -refilled euthyrox

## 2020-12-17 NOTE — Assessment & Plan Note (Signed)
Patient presents with history of high blood pressure.  She is currently taking Atenolol 50 mg for heart palpitations. Her BP is well-controlled at 135/90 in clinic today.  On chart review, patient has been on lisinopril in the past for HTN.  No additional medication needed at this time, but will get bmp to check creatinine and GFR for baseline. Plan: -BMP -refilled atenolol 50 mg

## 2020-12-17 NOTE — Assessment & Plan Note (Signed)
Following EKG sensoring placement today, nurse noted that patient had extensive rash underneath breast bilaterally.  Rash is erythematous, homogenous patch on skin underlying breasts bilaterally.  Patient states that she thought is was a heat rash and had been using cortisone cream without relief.  Rash is most likely Intertrigo.  Will send in Nystatin powder to see if that is helpful.  Patient told to call clinic if rash does not improve with nystatin. Plan: -nystatin powder

## 2020-12-17 NOTE — Progress Notes (Signed)
   CC: establishing care with clinic for Diabetes, htn, hld, rash, hypothyroidism, and depression  HPI:  [name] is a 70 y.o. with medical history as below presenting to [facility] for establishing care with clinic for Diabetes, htn, hld, rash, hypothyroidism, and depression.  Please see problem-based list for further details, assessments, and plans.  Past Medical History:  Diagnosis Date   Depression    on meds, working well   Diabetes mellitus without complication (HCC)    Heart palpitations    rapid   Hypertension     Review of Systems:   Constitutional: denies weight change or fatigue Gastrointestinal: denies nausea, vomiting, diarrhea, or constipation Psychiatric/Behavioral: denies problems with depression, she states that the medications are helping  Physical Exam:  Vitals:   12/17/20 0913  BP: 135/90  Pulse: 87  Resp: 20  Temp: 98.3 F (36.8 C)  TempSrc: Oral  SpO2: 99%  Weight: 160 lb 11.2 oz (72.9 kg)  Height: 4\' 11"  (1.499 m)    General: well-developed, obese HENT: NCAT, no scars noted Eyes: no scleral icterus, conjunctiva clear CV: normal rate and rhythm, no m,r,g Pulm: CTAB, pulmonary effort normal GI: no tenderness, bowel sounds present MSK: normal tone and gait, muscle strength 5/5 in hip flexion and knee flexion Skin: erythematous, homogenous, scaly patch present on skin under breaths bilaterally Psych: normal mood and affect  Assessment & Plan:   See Encounters Tab for problem based charting.  Patient seen with Dr. 

## 2020-12-17 NOTE — Assessment & Plan Note (Addendum)
Patient presents with history of being on Atenolol to treat heart palpitations.  Previous EKGs showed NSR in 2015 and Sinus tachycardia in 2016 when she ran out of her atenolol.  Patient states that when she is on atenolol she does not have palpitations.  Without atenolol palpitations are intermittent.  When they happen, she does not have chest pain or shortness of breath. Palpitations are most likely due to PVCs.  She states she has never seen a cardiologist.  EKG repeated today and showed NSR with left axis deviation that was seen on previous EKG in 2016. Plan: -repeat EKG today -refilled atenolol 50 mg

## 2020-12-18 LAB — LIPID PANEL
Chol/HDL Ratio: 7.2 ratio — ABNORMAL HIGH (ref 0.0–4.4)
Cholesterol, Total: 201 mg/dL — ABNORMAL HIGH (ref 100–199)
HDL: 28 mg/dL — ABNORMAL LOW (ref >39)
LDL Chol Calc (NIH): 124 mg/dL — ABNORMAL HIGH (ref 0–99)
Triglycerides: 277 mg/dL — ABNORMAL HIGH (ref 0–149)
VLDL Cholesterol Cal: 49 mg/dL — ABNORMAL HIGH (ref 5–40)

## 2020-12-18 LAB — MICROALBUMIN / CREATININE URINE RATIO
Creatinine, Urine: 87.2 mg/dL
Microalb/Creat Ratio: 7 mg/g creat (ref 0–29)
Microalbumin, Urine: 6.1 ug/mL

## 2020-12-18 LAB — BMP8+ANION GAP
Anion Gap: 17 mmol/L (ref 10.0–18.0)
BUN/Creatinine Ratio: 19 (ref 12–28)
BUN: 28 mg/dL — ABNORMAL HIGH (ref 8–27)
CO2: 22 mmol/L (ref 20–29)
Calcium: 9.7 mg/dL (ref 8.7–10.3)
Chloride: 95 mmol/L — ABNORMAL LOW (ref 96–106)
Creatinine, Ser: 1.49 mg/dL — ABNORMAL HIGH (ref 0.57–1.00)
Glucose: 355 mg/dL — ABNORMAL HIGH (ref 65–99)
Potassium: 4.1 mmol/L (ref 3.5–5.2)
Sodium: 134 mmol/L (ref 134–144)
eGFR: 38 mL/min/{1.73_m2} — ABNORMAL LOW (ref >59)

## 2020-12-18 LAB — TSH: TSH: 4.8 u[IU]/mL — ABNORMAL HIGH (ref 0.450–4.500)

## 2020-12-18 LAB — T4, FREE: Free T4: 1.08 ng/dL (ref 0.82–1.77)

## 2020-12-20 DIAGNOSIS — N189 Chronic kidney disease, unspecified: Secondary | ICD-10-CM | POA: Insufficient documentation

## 2020-12-20 DIAGNOSIS — R7989 Other specified abnormal findings of blood chemistry: Secondary | ICD-10-CM | POA: Insufficient documentation

## 2020-12-20 MED ORDER — EMPAGLIFLOZIN 10 MG PO TABS: 10.0000 mg | ORAL_TABLET | Freq: Every day | ORAL | 1 refills | Status: DC

## 2020-12-20 MED ORDER — ROSUVASTATIN CALCIUM 20 MG PO TABS
20.0000 mg | ORAL_TABLET | Freq: Every day | ORAL | 11 refills | Status: DC
Start: 2020-12-20 — End: 2021-01-04

## 2020-12-20 NOTE — Addendum Note (Signed)
Addended by: Lucille Passy on: 12/20/2020 02:34 PM   Modules accepted: Orders

## 2020-12-20 NOTE — Assessment & Plan Note (Signed)
Lab Results  Component Value Date   CREATININE 1.49 (H) 12/17/2020   CREATININE 1.18 (H) 09/10/2020   CREATININE 1.07 (H) 11/06/2016  Patient presents with increase in creatinine from 1.18-->1.49.  Microalb/Creat Ratio was wnl.  Microalb/Creat Ratio 0 - 29 mg/g creat 7     Plan: -recheck BMP at next visit, may consider renal US if continues to be increased

## 2020-12-20 NOTE — Progress Notes (Signed)
Internal Medicine Clinic Attending  I saw and evaluated the patient.  I personally confirmed the key portions of the history and exam documented by Dr. Masters and I reviewed pertinent patient test results.  The assessment, diagnosis, and plan were formulated together and I agree with the documentation in the resident's note.  

## 2020-12-20 NOTE — Assessment & Plan Note (Signed)
>>  ASSESSMENT AND PLAN FOR CHRONIC KIDNEY DISEASE WRITTEN ON 12/20/2020  2:32 PM BY Jaylei Fuerte, DO  Lab Results  Component Value Date   CREATININE 1.49 (H) 12/17/2020   CREATININE 1.18 (H) 09/10/2020   CREATININE 1.07 (H) 11/06/2016  Patient presents with increase in creatinine from 1.18-->1.49.  Microalb/Creat Ratio was wnl.  Microalb/Creat Ratio 0 - 29 mg/g creat 7     Plan: -recheck BMP at next visit, may consider renal US  if continues to be increased

## 2020-12-27 ENCOUNTER — Telehealth: Payer: Self-pay | Admitting: Dietician

## 2020-12-27 NOTE — Telephone Encounter (Signed)
Left voicemail for return call Norm Parcel, RD 12/27/2020 2:28 PM.

## 2020-12-31 ENCOUNTER — Encounter: Payer: Self-pay | Admitting: Internal Medicine

## 2021-01-04 ENCOUNTER — Other Ambulatory Visit: Payer: Self-pay | Admitting: Dietician

## 2021-01-04 ENCOUNTER — Ambulatory Visit: Payer: Medicare Other | Admitting: Dietician

## 2021-01-04 ENCOUNTER — Ambulatory Visit (INDEPENDENT_AMBULATORY_CARE_PROVIDER_SITE_OTHER): Payer: Medicare Other | Admitting: Internal Medicine

## 2021-01-04 ENCOUNTER — Encounter: Payer: Self-pay | Admitting: Dietician

## 2021-01-04 VITALS — BP 126/73 | HR 85 | Temp 98.4°F | Ht 59.0 in | Wt 161.5 lb

## 2021-01-04 DIAGNOSIS — E785 Hyperlipidemia, unspecified: Secondary | ICD-10-CM

## 2021-01-04 DIAGNOSIS — E039 Hypothyroidism, unspecified: Secondary | ICD-10-CM

## 2021-01-04 DIAGNOSIS — F32A Depression, unspecified: Secondary | ICD-10-CM

## 2021-01-04 DIAGNOSIS — E1142 Type 2 diabetes mellitus with diabetic polyneuropathy: Secondary | ICD-10-CM

## 2021-01-04 DIAGNOSIS — E1342 Other specified diabetes mellitus with diabetic polyneuropathy: Secondary | ICD-10-CM

## 2021-01-04 DIAGNOSIS — I10 Essential (primary) hypertension: Secondary | ICD-10-CM | POA: Diagnosis not present

## 2021-01-04 DIAGNOSIS — N1831 Chronic kidney disease, stage 3a: Secondary | ICD-10-CM

## 2021-01-04 MED ORDER — PAROXETINE HCL 40 MG PO TABS: 40.0000 mg | ORAL_TABLET | Freq: Every day | ORAL | 1 refills | Status: DC

## 2021-01-04 MED ORDER — ATENOLOL 50 MG PO TABS: 50.0000 mg | ORAL_TABLET | Freq: Two times a day (BID) | ORAL | 1 refills | Status: DC

## 2021-01-04 MED ORDER — ROSUVASTATIN CALCIUM 20 MG PO TABS: 20.0000 mg | ORAL_TABLET | Freq: Every day | ORAL | 1 refills | Status: DC

## 2021-01-04 MED ORDER — EUTHYROX 50 MCG PO TABS: 50.0000 ug | ORAL_TABLET | Freq: Every day | ORAL | 3 refills | Status: DC

## 2021-01-04 NOTE — Patient Instructions (Signed)
Please record the time, amount and what food drinks and activities you have while wearing the continuous glucose monitor (CGM). ? ?Bring the folder with you to follow up appointments. If your monitor falls off, please place it in the bag provided in your folder and bring it back with you to your next appointment.  ? ?Do not have a CT or an MRI while wearing the CGM.  ? ?1 week visit has been set up with me and a doctor for the first of two CGM downloads.  ? ?You will also return in 2 weeks to have your second download and the CGM removed. ? ?Geraldyne Barraclough ?[phone] ? ? ?

## 2021-01-04 NOTE — Progress Notes (Signed)
   CC: Type 2 Diabetes follow up  HPI:  Jennifer Ingram is a 70 y.o. with a PMHx as listed below who presents to the clinic for Type 2 Diabetes follow up.   Please see the Encounters tab for problem-based Assessment & Plan regarding status of patient's acute and chronic conditions.  Past Medical History:  Diagnosis Date   Depression    on meds, working well   Diabetes mellitus without complication (HCC)    Heart palpitations    rapid   Hypertension    Review of Systems: Review of Systems  Constitutional:  Positive for weight loss (intentional). Negative for chills and fever.  Cardiovascular:  Negative for palpitations.  Gastrointestinal:  Negative for abdominal pain, diarrhea, nausea and vomiting.  Genitourinary:  Positive for frequency. Negative for dysuria.   Physical Exam:  Vitals:   01/04/21 0922 01/04/21 0938  BP: (!) 155/95 126/73  Pulse: 87 85  Temp: 98.4 F (36.9 C)   TempSrc: Oral   SpO2: 100%   Weight: 161 lb 8 oz (73.3 kg)   Height: 4\' 11"  (1.499 m)    Physical Exam Vitals and nursing note reviewed.  Constitutional:      General: She is not in acute distress.    Appearance: She is obese.  HENT:     Head: Normocephalic and atraumatic.  Pulmonary:     Effort: Pulmonary effort is normal. No respiratory distress.  Skin:    General: Skin is warm and dry.  Neurological:     General: No focal deficit present.     Mental Status: She is alert and oriented to person, place, and time. Mental status is at baseline.     Gait: Gait normal.  Psychiatric:        Mood and Affect: Mood normal.        Behavior: Behavior normal.    Assessment & Plan:   See Encounters Tab for problem based charting.  Patient discussed with Dr. 

## 2021-01-04 NOTE — Telephone Encounter (Signed)
Patient request testing supplies for new sample meter.

## 2021-01-04 NOTE — Assessment & Plan Note (Signed)
-   Refilled Rosuvastatin with 90 day refills.

## 2021-01-04 NOTE — Assessment & Plan Note (Signed)
-   refilled Paroxetine with 90 day refills.

## 2021-01-04 NOTE — Assessment & Plan Note (Signed)
Historically, patient's GFR ranged between 45-60, however decreased to 38 at recent visit. Likely in the setting of uncontrolled diabetes mellitus. No evidence of proteinuria at this time. Will recheck BMP today.   - BMP ordered.

## 2021-01-04 NOTE — Assessment & Plan Note (Signed)
>>  ASSESSMENT AND PLAN FOR CHRONIC KIDNEY DISEASE WRITTEN ON 01/04/2021 10:27 AM BY ARNETT SAUNDERS, MD  Historically, patient's GFR ranged between 45-60, however decreased to 38 at recent visit. Likely in the setting of uncontrolled diabetes mellitus. No evidence of proteinuria at this time. Will recheck BMP today.   - BMP ordered.

## 2021-01-04 NOTE — Assessment & Plan Note (Signed)
Blood pressure within goal today.  - Refilled Atenolol 50 mg BID

## 2021-01-04 NOTE — Assessment & Plan Note (Addendum)
Ms. Lafave is here for follow up regarding diabetes. She states she has started [place] since her last visit and is tolerating it well. She has noticed increased urinary frequency but no dysuria or vaginal discharge. She has also increased her Humulin to BID dosing as she was previously prescribed. Since doing so, she has noticed her AM CBG is ~ 160s. She did not bring in her glucometer today. She denies any hypoglycemic events.   Assessment/Plan:  Initially considered the addition of a GLP-1 agonist given patient's goal for weight loss and significantly elevated a1c, however patient is hesitant to make the change and with an AM CBG of 160, she may be getting adequate control with her current regimen. She is meeting with [name], diabetic dietician, today to discuss lifestyle changes and CGM placement. With the CGM data, we will be able to make more informed recommendations regarding medication.   - Continue Jardiance 10 mg daily - Continue Humulin 70/30 BID - CGM follow up protocol - A1c in 8 weeks

## 2021-01-04 NOTE — Assessment & Plan Note (Signed)
-   Refilled Euthyrox with 90 day refills.

## 2021-01-04 NOTE — Progress Notes (Signed)
Ms. Jennifer Ingram is here for her first visit for diabetes education. She reports having diabetes for 8 years and no previous diabetes education or training. She would like to know about CGMs and meal planning. She did not bring her meter today and says she left hers in [place]  this weekend and that is it old, but she cannot recall the name of it. She is taking her insulin and denies low blood sugars "it's always high" . A new sample Accu chek GuideMe meter was given to her today and the result after repeating the demonstration with some assistance was 308 after eating some peanut butter this am and drinking diet drink.   Personal Continuous glucose monitoring was discussed with patient today. She was not ready to make a decision and desired to try a professional CGM first  Documentation for Freestyle Libre Pro Continuous glucose monitoring after discussion with patient and Dr. Lars Mage Libre Pro CGM sensor placed today. Patient was educated about wearing sensor, keeping food, activity and medication log and when to call office. Patient was educated about how to care for the sensor and not to have an MRI, CT or Diathermy while wearing the sensor. Follow up was arranged with the patient for 1 week.   Lot #: [id number] Serial #: [id number] Expiration Date: 04/30/21  Norm Parcel, RD 01/04/2021 10:49 AM.

## 2021-01-04 NOTE — Patient Instructions (Signed)
It was nice seeing you today! Thank you for choosing [facility] for your Primary Care.    Today we talked about:   Diabetes: Continue with your current medications. Jennifer Ingram placed a continuous sugar monitor today.  Please return in 7 days for Download #1. I have refilled your medications today with 90 day prescriptions.

## 2021-01-05 LAB — BMP8+ANION GAP
Anion Gap: 21 mmol/L — ABNORMAL HIGH (ref 10.0–18.0)
BUN/Creatinine Ratio: 23 (ref 12–28)
BUN: 32 mg/dL — ABNORMAL HIGH (ref 8–27)
CO2: 19 mmol/L — ABNORMAL LOW (ref 20–29)
Calcium: 9.7 mg/dL (ref 8.7–10.3)
Chloride: 99 mmol/L (ref 96–106)
Creatinine, Ser: 1.41 mg/dL — ABNORMAL HIGH (ref 0.57–1.00)
Glucose: 262 mg/dL — ABNORMAL HIGH (ref 65–99)
Potassium: 4.4 mmol/L (ref 3.5–5.2)
Sodium: 139 mmol/L (ref 134–144)
eGFR: 41 mL/min/{1.73_m2} — ABNORMAL LOW (ref >59)

## 2021-01-06 MED ORDER — ACCU-CHEK GUIDE VI STRP: ORAL_STRIP | 12 refills | Status: AC

## 2021-01-06 MED ORDER — ACCU-CHEK SOFTCLIX LANCETS MISC: 11 refills | Status: AC

## 2021-01-11 ENCOUNTER — Ambulatory Visit (INDEPENDENT_AMBULATORY_CARE_PROVIDER_SITE_OTHER): Payer: Medicare Other | Admitting: Dietician

## 2021-01-11 ENCOUNTER — Encounter: Payer: Self-pay | Admitting: Dietician

## 2021-01-11 ENCOUNTER — Ambulatory Visit (INDEPENDENT_AMBULATORY_CARE_PROVIDER_SITE_OTHER): Payer: Medicare Other | Admitting: Internal Medicine

## 2021-01-11 VITALS — BP 122/66 | HR 81 | Temp 98.3°F | Ht 59.0 in | Wt 165.8 lb

## 2021-01-11 DIAGNOSIS — Z0000 Encounter for general adult medical examination without abnormal findings: Secondary | ICD-10-CM

## 2021-01-11 DIAGNOSIS — E119 Type 2 diabetes mellitus without complications: Secondary | ICD-10-CM

## 2021-01-11 DIAGNOSIS — E1342 Other specified diabetes mellitus with diabetic polyneuropathy: Secondary | ICD-10-CM | POA: Diagnosis not present

## 2021-01-11 MED ORDER — OZEMPIC (0.25 OR 0.5 MG/DOSE) 2 MG/1.5ML ~~LOC~~ SOPN: PEN_INJECTOR | SUBCUTANEOUS | 0 refills | Status: DC

## 2021-01-11 MED ORDER — HUMULIN 70/30 (70-30) 100 UNIT/ML ~~LOC~~ SUSP: 30.0000 [IU] | Freq: Two times a day (BID) | SUBCUTANEOUS | 3 refills | Status: DC

## 2021-01-11 NOTE — Patient Instructions (Addendum)
[name]   Thank you for your visit today!   You are doing a very good job caring for your diabetes!  Please continue to write down what you eat including any snacks or milk on the sheet provided.   Bring this sheet and your meter as you did today with you next week and again when you see me in October.   Call me anytime.   [name] [phone]

## 2021-01-11 NOTE — Progress Notes (Signed)
Medical Nutrition Therapy   Initial visit start time: 1030        End time: 1110  (Diabetes Management) Jennifer Ingram is a 70 y.o. female who presents to clinic for diabetes management with  registered dietitian. Referred by Dr. Sloan Leiter for diabetes education and CGM ASSESSMENT: CGM sensor was downloaded and explained report to patient along with her food record. She was too anxious to fully absorb. She had trouble with her meter, she was provided with another sample and needed assistance with learning how to use  the lancing device. She forgets Pm insulin ~50% of the time. She denies having any symptoms of low blood sugar but says her blood sugar tends to be lowest midday. This is when her CGM report shows hypoglycemia and a dip in her blood sugars.  Her report shows fairly flat pattern until after dinner when it shows the largest excursion  from ~ 180 mg/dl up to 373-428 mg/d. Reviewed using her meter, she gets confused easily and is anxious. Will bring her back in 1 month to follow up on meter and diabetes education needs.   History of Current Diagnoses/Problems:  Patient Active Problem List   Diagnosis Date Noted   Chronic kidney disease 12/20/2020   Depression 12/17/2020   Diabetes mellitus with polyneuropathy (HCC) 12/17/2020   Heart palpitations 12/17/2020   Hypothyroid 12/17/2020   Hypertension 12/17/2020   Hyperlipidemia 12/17/2020   Healthcare maintenance 12/17/2020   Intertrigo 12/17/2020    Current Medications: jardiance , 40 units Humulin 70/30 once dose ~ 7 AM and the second ~ 5 PM. She says she forgets the PM dose 3/7 times a week, she never forgets her morning dose or her jardiance.   Vitamins/Supplements: need to assess at future visit Nutrition-related Lab Values:  Cholesterol, Total  Date Value Ref Range Status  12/17/2020 201 (H) 100 - 199 mg/dL Final   HDL  Date Value Ref Range Status  12/17/2020 28 (L) >39 mg/dL Final   Chol/HDL Ratio  Date Value Ref Range  Status  12/17/2020 7.2 (H) 0.0 - 4.4 ratio Final    Comment:                                      T. Chol/HDL Ratio                                             Men  Women                               1/2 Avg.Risk  3.4    3.3                                   Avg.Risk  5.0    4.4                                2X Avg.Risk  9.6    7.1                                3X  Avg.Risk 23.4   11.0    LDL Chol Calc (NIH)  Date Value Ref Range Status  12/17/2020 124 (H) 0 - 99 mg/dL Final   VFI4P POC (<> result, manual entry)  Date Value Ref Range Status  12/17/2020 >14.0 (A) 4.0 - 5.6 % Final   BP Readings from Last 3 Encounters:  01/11/21 122/66  01/04/21 126/73  12/17/20 135/90   BG monitoring: lost her meter, had difficulty with the sample provided last week, gave her another sample and she was able to demonstrate ability to use it with assistance, but says she is competent.  High Low Average DM health maintenance:  Last dental exam: need to assess at future visit  Last eye exam: ordered  Home foot exams: yes  Medication-related issues: not aware of low blood sugars, forget to take Pm dose  Tobacco use: never Weight History & Anthropometrics: Estimated body mass index is 33.49 kg/m as calculated from the following:   Height as of an earlier encounter on 01/11/21: 4\' 11"  (1.499 m).   Weight as of an earlier encounter on 01/11/21: 165 lb 12.8 oz (75.2 kg).  Wt Readings from Last 6 Encounters:  01/11/21 165 lb 12.8 oz (75.2 kg)  01/04/21 161 lb 8 oz (73.3 kg)  12/17/20 160 lb 11.2 oz (72.9 kg)  11/06/16 165 lb (74.8 kg)  03/13/13 130 lb (59 kg)  07/25/12 155 lb (70.3 kg)    Social:/Housing: lives with son Employment: unemployed Barriers: confidence and material resources  Will assess at future visit: Food Security,Shopping & meal prep,Behavioral Health,Disordered Eating:  Sleep:10p-7a Physical Activity:need to assess at future visit Special diet/food allergies: no Dietary  Patterns & Changes: no  Intake - Typical Day Recall: yes  Breakfast: 1 pack of maple oatmeal  or 1/2 honey peanut butter on whole wheat bread and coffee  Lunch: soup  Dinner: steak and potato or pizza   Snacks: oreo x 1-2 and a glass of milk  Beverages/Fluid Intake: coffee, tea, water, diet dr pepper, coke  Alcohol use: no  Nighttime eating? no  Areas for intervention: assess diabetes knowledge and build confidence Special Dietary Needs: Consistent Carbohydrate Diet NUTRITION DIAGNOSIS: Altered nutrition-related lab values related to endocrine dysfunction  as evidenced by elevated BG 177and A1C 14.  INTERVENTION: Completed nutrition assessment. Reviewed current diet/exercise. Provided  patient with handouts: CGM report and meter education and  After visit summary . Provided nutrition education on cgm report, diabetes numbers and meter.  Helped client create personalized goals.   MONITORING/EVALUATION:  Patient Goals:  1. Diabetes monitoring: before meals a. A1C: Goal is <7.0. Patient is currently above  goal with last A1c of 14 b. BG: Goal is 80-130 (pre-prandial) or <180 (post-prandial). Patient is above currently at  goal with home BGs of 177 and average of 209 c. Weight: Goal is maintenance of current weight. Consider weight loss goal of 5-7% of  baseline weight when appropriate. d. CVD Risk: Goal is BP <140/90. Patient is  currently at goal.   2. Dietary changes: 5) record intake of all foods inclusing snacks 3. Fluid intake goal: drink eight 8oz glasses of water per day.   Follow up: 1 week for cgm download and 1 month for [facility], [facility] 01/11/21

## 2021-01-11 NOTE — Patient Instructions (Addendum)
[name], it was a pleasure seeing you today!  Today we discussed: You stated you were doing fine. You presented for diabetes follow up. Your monitor showed 50 percent readings being high. We will decrease your insulin to 30 units twice daily from 40 units twice daily. We will start a GLP agonist that will be once weekly. Please follow up in one month.   I have ordered the following labs today:  Lab Orders  No laboratory test(s) ordered today      Referrals ordered today:   Referral Orders         Ambulatory referral to Ophthalmology       I have ordered the following medication/changed the following medications:   Stop the following medications: Medications Discontinued During This Encounter  Medication Reason   insulin NPH-regular Human (HUMULIN 70/30) (70-30) 100 UNIT/ML injection      Start the following medications: Meds ordered this encounter  Medications   insulin NPH-regular Human (HUMULIN 70/30) (70-30) 100 UNIT/ML injection    Sig: Inject 30 Units into the skin 2 (two) times daily.    Dispense:  10 mL    Refill:  3     Follow-up: 1 month follow up  Please make sure to arrive 15 minutes prior to your next appointment. If you arrive late, you may be asked to reschedule.   We look forward to seeing you next time. Please call our clinic at [phone] if you have any questions or concerns. The best time to call is Monday-Friday from 9am-4pm, but there is someone available 24/7. If after hours or the weekend, call the main hospital number and ask for the Internal Medicine Resident On-Call. If you need medication refills, please notify your pharmacy one week in advance and they will send [place] a request.  Thank you for letting us take part in your care. Wishing you the best!  Thank you, Gwenevere Abbot, MD

## 2021-01-11 NOTE — Assessment & Plan Note (Signed)
Ophthalmology referral placed.  Flu shot declined She stated she will get shingles from pharmacy

## 2021-01-11 NOTE — Progress Notes (Addendum)
   CC: Diabetes follow up  HPI:  [name] is a 70 y.o. with medical history as below presenting to [facility] for diabetes follow up.   Please see problem-based list for further details, assessments, and plans.  Past Medical History:  Diagnosis Date   Depression    on meds, working well   Diabetes mellitus without complication (HCC) 05/31/2012   Heart palpitations    rapid   Hypertension    Review of Systems: Review of system negative unless stated in the problem list or HPI.    Physical Exam:  Vitals:   01/11/21 1115  BP: 122/66  Pulse: 81  Temp: 98.3 F (36.8 C)  TempSrc: Oral  SpO2: 96%  Weight: 165 lb 12.8 oz (75.2 kg)  Height: 4\' 11"  (1.499 m)    Physical Exam Constitutional:      General: She is not in acute distress.    Appearance: Normal appearance. She is not ill-appearing.  HENT:     Head: Normocephalic and atraumatic.     Right Ear: External ear normal.     Left Ear: External ear normal.     Nose: Nose normal.     Mouth/Throat:     Mouth: Mucous membranes are moist.     Pharynx: Oropharynx is clear.  Eyes:     Extraocular Movements: Extraocular movements intact.     Conjunctiva/sclera: Conjunctivae normal.     Pupils: Pupils are equal, round, and reactive to light.  Cardiovascular:     Rate and Rhythm: Normal rate and regular rhythm.     Pulses: Normal pulses.     Heart sounds: Normal heart sounds.  Pulmonary:     Effort: Pulmonary effort is normal. No respiratory distress.     Breath sounds: Normal breath sounds. No stridor. No wheezing or rhonchi.  Abdominal:     General: Bowel sounds are normal. There is no distension.     Palpations: Abdomen is soft. There is no mass.  Musculoskeletal:        General: No swelling or tenderness. Normal range of motion.     Cervical back: Normal range of motion and neck supple.  Skin:    Capillary Refill: Capillary refill takes less than 2 seconds.     Coloration: Skin is not jaundiced.     Findings: No  erythema, lesion or rash.  Neurological:     General: No focal deficit present.     Mental Status: She is alert and oriented to person, place, and time. Mental status is at baseline.  Psychiatric:        Mood and Affect: Mood normal.        Behavior: Behavior normal.    Assessment & Plan:   See Encounters Tab for problem based charting.  Patient seen with Dr. , MD   Jennifer Ingram wore the CGM for 8 days. The average reading was 209, % time in target was 39, % time below target was 2, and % time above target was. 59. Intervention will be to addition of ozempic and decreasing her insulin to 30 units BID. The patient will be scheduled to see Jennifer Ingram back for a another appointment within 2-4 weeks.

## 2021-01-11 NOTE — Assessment & Plan Note (Signed)
Assessment: Patient presented with diabetes follow up. Her continuous glucose monitoring showed more than 50 percent readings being high. She is on Jardiance 10 mg and Humulin 40 units BID. Plan is to decrease the Humulin to 30 units BID and start Ozempic 0.5 weekly with the first two weeks injecting only 0.25 weekly.   Plan: -Start Ozempic 0.5 weekly -Follow up in one month -Only one month supply sent so please send refills if she has better control and she is tolerating it well.

## 2021-01-14 ENCOUNTER — Encounter: Payer: Medicare Other | Admitting: Internal Medicine

## 2021-01-21 NOTE — Progress Notes (Signed)
Internal Medicine Clinic Attending  I saw and evaluated the patient.  I personally confirmed the key portions of the history and exam documented by Dr. Khan and I reviewed pertinent patient test results.  The assessment, diagnosis, and plan were formulated together and I agree with the documentation in the resident's note.  

## 2021-01-24 ENCOUNTER — Other Ambulatory Visit: Payer: Self-pay | Admitting: Internal Medicine

## 2021-01-24 DIAGNOSIS — Z1231 Encounter for screening mammogram for malignant neoplasm of breast: Secondary | ICD-10-CM

## 2021-02-08 ENCOUNTER — Other Ambulatory Visit: Payer: Self-pay

## 2021-02-08 ENCOUNTER — Ambulatory Visit (INDEPENDENT_AMBULATORY_CARE_PROVIDER_SITE_OTHER): Payer: Medicare Other | Admitting: Internal Medicine

## 2021-02-08 ENCOUNTER — Ambulatory Visit (INDEPENDENT_AMBULATORY_CARE_PROVIDER_SITE_OTHER): Payer: Medicare Other | Admitting: Dietician

## 2021-02-08 ENCOUNTER — Encounter: Payer: Self-pay | Admitting: Dietician

## 2021-02-08 ENCOUNTER — Encounter: Payer: Self-pay | Admitting: Internal Medicine

## 2021-02-08 DIAGNOSIS — E119 Type 2 diabetes mellitus without complications: Secondary | ICD-10-CM

## 2021-02-08 DIAGNOSIS — E1342 Other specified diabetes mellitus with diabetic polyneuropathy: Secondary | ICD-10-CM

## 2021-02-08 MED ORDER — EMPAGLIFLOZIN 10 MG PO TABS: 10.0000 mg | ORAL_TABLET | Freq: Every day | ORAL | 1 refills | Status: DC

## 2021-02-08 MED ORDER — HUMULIN 70/30 (70-30) 100 UNIT/ML ~~LOC~~ SUSP: 40.0000 [IU] | Freq: Two times a day (BID) | SUBCUTANEOUS | 1 refills | Status: DC

## 2021-02-08 MED ORDER — OZEMPIC (0.25 OR 0.5 MG/DOSE) 2 MG/1.5ML ~~LOC~~ SOPN: 0.5000 mg | PEN_INJECTOR | SUBCUTANEOUS | 1 refills | Status: DC

## 2021-02-08 NOTE — Patient Instructions (Addendum)
You had 4 more low blood sugars the second week of wearing the Continuous glucose monitoring. Glad you like the Jardiance   This may have been due to taking the 40 units instead of the 30 units   To help decrease those low blood sugars: Inject the amounts of insulin  rec by your doctor of  Humulin 70/30 insulin 30 minutes BEFORE eating breakfast and the amount recommended by your doctor 30 minutes BEFORE dinner. Your doctor may decrease it more today  To help decrease your higher blood sugar- drink water or unsweet tea instead of regular coke-  Consider showing th two meal on your report from this week to your son with the difference between drinking regular coke and unsweet tea.   Your weight is decreased 4 pounds- way to go!!!  Our office should be calling about an eye exam at Dr. Laruth Bouchard office.   [name] [phone]

## 2021-02-08 NOTE — Addendum Note (Signed)
Addended by: Ellison Carwin on: 02/08/2021 02:11 PM   Modules accepted: Orders

## 2021-02-08 NOTE — Assessment & Plan Note (Addendum)
Patient presents to clinic for follow-up for diabetes management.  At last appointment 9/13 patient was instructed to take Humulin 30 units twice daily, Jardiance 10 mg daily, and start Ozempic weekly injections. Last Hgb A1c from 12/17/2020 was greater than 14%.  [name] wore the CGM for 15 days. The average reading was 177, % time in target was 54%, % time below target was 4%, and % time above target was 42%.   Today, patient states that she tried 30 units Humulin twice daily, however she reports her blood sugars seemed too high and she did not feel well on 30 units therefore she switched back to 40 units twice daily of Humulin.  She denies symptoms of hypoglycemia.  Per CBG monitoring, patient seems to have largest peak at dinnertime.  We discussed adjusting mealtime such that patient has a snack late afternoon and a smaller dinner.  We also discussed checking fasting blood sugar, and checking 1 to 2 hours after mealtimes.  Plan:  -Ozempic 0.5 mg weekly, ordered refill -Jardiance 10 mg daily, ordered refill -Humulin 40 units twice daily -Adjust mealtime such that patient has snack in the afternoon and smaller dinner to avoid dinnertime peak -Continue lifestyle modifications -Patient will schedule appointment with ophthalmologist, referral has been previously placed -We will start process of obtaining insurance approval for freestyle libre monitoring device

## 2021-02-08 NOTE — Progress Notes (Signed)
  CC: follow up for diabetes management   HPI:  [name] is a 70 y.o. female with a past medical history stated below and presents today for follow-up for diabetes management. Please see problem based assessment and plan for additional details.  Past Medical History:  Diagnosis Date   Depression    on meds, working well   Diabetes mellitus without complication (HCC) 05/31/2012   Heart palpitations    rapid   Hypertension     Current Outpatient Medications on File Prior to Visit  Medication Sig Dispense Refill   Accu-Chek Softclix Lancets lancets Check blood sugar 3 times a day 100 each 11   atenolol (TENORMIN) 50 MG tablet Take 1 tablet (50 mg total) by mouth 2 (two) times daily. 180 tablet 1   EUTHYROX 50 MCG tablet Take 1 tablet (50 mcg total) by mouth daily. 90 tablet 3   gabapentin (NEURONTIN) 300 MG capsule Take 1 capsule (300 mg total) by mouth 2 (two) times daily. 60 capsule 2   glucose blood (ACCU-CHEK GUIDE) test strip Check blood sugar 3 times per day 100 each 12   insulin NPH-regular Human (HUMULIN 70/30) (70-30) 100 UNIT/ML injection Inject 30 Units into the skin 2 (two) times daily. 10 mL 3   nystatin (MYCOSTATIN/NYSTOP) powder Apply 1 application topically 3 (three) times daily. 15 g 0   PARoxetine (PAXIL) 40 MG tablet Take 1 tablet (40 mg total) by mouth daily after breakfast. 90 tablet 1   rosuvastatin (CRESTOR) 20 MG tablet Take 1 tablet (20 mg total) by mouth daily. 90 tablet 1   No current facility-administered medications on file prior to visit.    Family History:  Family History  Problem Relation Age of Onset   Cancer Mother        leukemia   Cancer Father        lung    Review of Systems: ROS negative except for what is noted on the assessment and plan.  Vitals:   02/08/21 0959  BP: 113/74  Pulse: 93  Temp: 97.9 F (36.6 C)  TempSrc: Oral  SpO2: 97%  Weight: 160 lb (72.6 kg)  Height: 4\' 11"  (1.499 m)     Physical Exam: General:  Well appearing elderly Caucasian female, NAD HENT: normocephalic, atraumatic, MMM EYES: conjunctiva non-erythematous, no scleral icterus CV: regular rate, normal rhythm, no murmurs, rubs, gallops.  Trace lower extremity edema bilaterally Pulmonary: Normal work of breathing on RA, lungs clear to auscultation, no rales, wheezes, rhonchi Abdominal: non-distended, soft, non-tender to palpation, normal BS Skin: Warm and dry, no rashes or lesions Neurological: MS: awake, alert and oriented x3, normal speech and fund of knowledge Motor: moves all extremities antigravity Psych: normal affect   Assessment & Plan:   See Encounters Tab for problem based charting.  wore the CGM for 15 days. The average reading was 177, % time in target was 54%, % time below target was 4%, and % time above target was 42%. Intervention will be to adjust mealtime such that patient has snack in the afternoon and a smaller dinner. The patient will be scheduled to see [name] for follow-up appointment in 1 month.  Patient seen with Dr. [place], M.D. [facility], PGY-1 Pager: [phone] Date 02/08/2021 Time 11:12 AM

## 2021-02-08 NOTE — Progress Notes (Signed)
Medical Nutrition Therapy    start time: 0908        End time: 1000 Total time: 52 minutes   (Diabetes Management) Jennifer Ingram is a 70 y.o. female who presents to clinic for diabetes management with registered dietitian. Referred by Dr. Sloan Leiter in September for diabetes education and CGM ASSESSMENT: CGM sensor was removed and downloaded and explained report to patient along with her food record.  She denies having any symptoms of low blood sugar. However CGM shows 4 additional hypoglycemic events the second week of CGM. They mostly occur around 10 PM -4 am, but she also had one about 4 PM. She has continued taking 40 units twice daily instead of 30 units twice daily. She likes the jardiance and has noticed more urination.  am This is when her CGM report shows hypoglycemia and a dip in her blood sugars.  Her report again shows fairly flat pattern until after dinner when it shows the largest excursion  from ~ 180 mg/dl up to 007-121 mg/d.  The second week her largest excursion was when she knows she forgot her PM insulin.  Her weight is decreased and she reports cutting back portions of every things because "I weigh too much for my height" .  We were able to compare identical meals on her CGM report with different beverages( one sweet and one unsweet) for her to see the impact it has on her blood sugar. The unsweet beverage meal stayed in target 100%, the meal with regular coke rose above target significantly.  Current Medications: jardiance , 40 units Humulin 70/30 one dose ~ 7 AM and the second ~ 5 PM. She says she forget the PM dose 1/7 times, she never forgets her morning dose or her jardiance. She denies need for a reminder on her cell phone.  She denied interest in a personal Continuous glucose monitoring.  Vitamins/Supplements: need to assess at future visit Nutrition-related Lab Values: A1C due again 03/19/21  BP Readings from Last 3 Encounters:  02/08/21 113/74  01/11/21 122/66   01/04/21 126/73   BG monitoring: doing well with her meter, she was able to demonstrate ability to use it today with result of 186 after taking 40 units Humulin 70-30 insulin and eating 1/2 peanut butter sand  DM health maintenance:  Last dental exam: need to assess at future visit  Medication-related issues: not aware of low blood sugars, forgets to take Pm dose  Weight History & Anthropometrics: Estimated body mass index is 32.5 kg/m as calculated from the following:   Height as of 01/11/21: 4\' 11"  (1.499 m).   Weight as of this encounter: 160 lb 14.4 oz (73 kg).  Wt Readings from Last 6 Encounters:  02/08/21 160 lb (72.6 kg)  02/08/21 160 lb 14.4 oz (73 kg)  01/11/21 165 lb 12.8 oz (75.2 kg)  01/04/21 161 lb 8 oz (73.3 kg)  12/17/20 160 lb 11.2 oz (72.9 kg)  11/06/16 165 lb (74.8 kg)    Will assess at future visit: Food Security,Shopping & meal prep,Behavioral Health,Disordered Eating:   Physical Activity:need to assess at future visit  Areas for intervention: assess diabetes knowledge and build confidence Special Dietary Needs: Consistent Carbohydrate Diet NUTRITION DIAGNOSIS: Altered nutrition-related lab values related to endocrine dysfunction  as evidenced by elevated BG 177and A1C 14.  INTERVENTION: Completed nutrition assessment. Reviewed current diet/exercise. Provided  patient with handouts: CGM report and meter education and  After visit summary . Provided nutrition education on cgm report, diabetes  numbers and meter.  Helped client create personalized goals.   MONITORING/EVALUATION:  Patient Goals:  1. Diabetes monitoring: before meals -50% of the time a. A1C: Goal is <7.0. Patient is currently above  goal with last A1c of 14 b. BG: Goal is 80-130 (pre-prandial) or <180 (post-prandial). Patient is above  goal and improved with 54% in target range and average of CGM x14 days 177 c. Weight: Goal is  weight loss goal of 5-7% of baseline weight which is 8-11#. She  lost 4# in the past month. d. CVD Risk: Goal is BP <140/90. Patient is  currently at goal.   2. Dietary changes: 5) record intake of all foods inclusing snacks 3. Fluid intake goal: drink eight 8oz glasses of water per day.   Follow up: 1 month for personal  cgm training and fu MNT Norm Parcel, CDCES 02/08/21

## 2021-02-08 NOTE — Patient Instructions (Signed)
Thank you, Ms.[name] for allowing [place] to provide your care today. Today we discussed your diabetes medications.  T2 Diabetes: We would like for you to continue taking the Ozempic 0.5 mg weekly, Jardiance 10 mg daily, Humulin 40 units twice a day.  Please take your blood sugar once in the morning before breakfast, this is your fasting blood glucose.  Then also take your blood sugar 1 to 2-hour after meals.  It seems that your blood sugar is the highest at dinnertime.  Please adjust your mealtime such that you are having a small snack in the afternoon and a smaller dinner.  We will have you follow-up in 1 month to see how this has affected your evening blood sugars.  Health maintenance: Please attend your mammogram, colonoscopy, podiatry appointment.  After which, please schedule an appointment with your ophthalmologist for an eye evaluation in a patient with type 2 diabetes mellitus.  My Chart Access: [email or web address]  Please follow-up in 1 month.  Please make sure to arrive 15 minutes prior to your next appointment. If you arrive late, you may be asked to reschedule.    We look forward to seeing you next time. Please call our clinic at [phone] if you have any questions or concerns. The best time to call is Monday-Friday from 9am-4pm, but there is someone available 24/7. If after hours or the weekend, call the main hospital number and ask for the Internal Medicine Resident On-Call. If you need medication refills, please notify your pharmacy one week in advance and they will send [place] a request.   Thank you for letting us take part in your care. Wishing you the best!  Ellison Carwin, MD 02/08/2021, 10:32 AM IM Resident, PGY-1

## 2021-02-11 NOTE — Progress Notes (Signed)
Internal Medicine Clinic Attending ° °I saw and evaluated the patient.  I personally confirmed the key portions of the history and exam documented by Dr. Zinoviev and I reviewed pertinent patient test results.  The assessment, diagnosis, and plan were formulated together and I agree with the documentation in the resident’s note.  °

## 2021-02-14 ENCOUNTER — Ambulatory Visit (INDEPENDENT_AMBULATORY_CARE_PROVIDER_SITE_OTHER): Payer: Medicare Other | Admitting: Podiatry

## 2021-02-14 ENCOUNTER — Other Ambulatory Visit: Payer: Self-pay

## 2021-02-14 ENCOUNTER — Encounter: Payer: Self-pay | Admitting: Podiatry

## 2021-02-14 DIAGNOSIS — B351 Tinea unguium: Secondary | ICD-10-CM | POA: Diagnosis not present

## 2021-02-14 DIAGNOSIS — N1831 Chronic kidney disease, stage 3a: Secondary | ICD-10-CM | POA: Diagnosis not present

## 2021-02-14 DIAGNOSIS — M79675 Pain in left toe(s): Secondary | ICD-10-CM | POA: Diagnosis not present

## 2021-02-14 DIAGNOSIS — E1342 Other specified diabetes mellitus with diabetic polyneuropathy: Secondary | ICD-10-CM | POA: Diagnosis not present

## 2021-02-14 DIAGNOSIS — M79674 Pain in right toe(s): Secondary | ICD-10-CM

## 2021-02-14 NOTE — Progress Notes (Addendum)
This patient returns to my office for at risk foot care.  This patient requires this care by a professional since this patient will be at risk due to having diabetic neuropathy and kidney disease. This patient presents to the office for diabetic foot exam.   This patient is unable to cut nails herself since the patient cannot reach her nails.These nails are painful walking and wearing shoes.  This patient presents for at risk foot care today.  General Appearance  Alert, conversant and in no acute stress.  Vascular  Dorsalis pedis and posterior tibial  pulses are palpable  bilaterally.  Capillary return is within normal limits  bilaterally. Temperature is within normal limits  bilaterally.  Neurologic  Senn-Weinstein monofilament wire test  diminished  bilaterally. Muscle power within normal limits bilaterally.  Nails Thick disfigured discolored nails with subungual debris  from hallux to fifth toes bilaterally. No evidence of bacterial infection or drainage bilaterally.  Orthopedic  No limitations of motion  feet .  No crepitus or effusions noted.  No bony pathology or digital deformities noted.  HAV  B/L.  Midfoot  DJD  B/L.  Skin  normotropic skin with no porokeratosis noted bilaterally.  No signs of infections or ulcers noted.     Onychomycosis  Pain in right toes  Pain in left toes  Diabetic neuropathy.  Consent was obtained for treatment procedures.  IE.   Mechanical debridement of nails 1-5  bilaterally performed with a nail nipper.  Filed with dremel without incident. Diabetic foot exam reveals diminished LOPS.  Patient is eligible for diabetic shoes.   Return office visit  3 months                    Told patient to return for periodic foot care and evaluation due to potential at risk complications.   Helane Gunther DPM

## 2021-02-22 ENCOUNTER — Telehealth: Payer: Self-pay

## 2021-02-24 NOTE — Telephone Encounter (Signed)
error 

## 2021-03-01 ENCOUNTER — Other Ambulatory Visit: Payer: Self-pay

## 2021-03-01 ENCOUNTER — Ambulatory Visit
Admission: RE | Admit: 2021-03-01 | Discharge: 2021-03-01 | Disposition: A | Discharge status: Home / Self Care | Payer: Medicare Other | Source: Ambulatory Visit | Attending: Internal Medicine | Admitting: Internal Medicine

## 2021-03-01 DIAGNOSIS — Z1231 Encounter for screening mammogram for malignant neoplasm of breast: Secondary | ICD-10-CM

## 2021-03-03 ENCOUNTER — Ambulatory Visit (AMBULATORY_SURGERY_CENTER): Payer: Medicare Other

## 2021-03-03 ENCOUNTER — Encounter: Payer: Self-pay | Admitting: Internal Medicine

## 2021-03-03 ENCOUNTER — Other Ambulatory Visit: Payer: Self-pay | Admitting: Internal Medicine

## 2021-03-03 ENCOUNTER — Other Ambulatory Visit: Payer: Self-pay

## 2021-03-03 VITALS — Ht 59.0 in | Wt 160.0 lb

## 2021-03-03 DIAGNOSIS — Z1211 Encounter for screening for malignant neoplasm of colon: Secondary | ICD-10-CM

## 2021-03-03 DIAGNOSIS — R928 Other abnormal and inconclusive findings on diagnostic imaging of breast: Secondary | ICD-10-CM

## 2021-03-03 NOTE — Progress Notes (Signed)

## 2021-03-15 ENCOUNTER — Ambulatory Visit: Payer: Medicare Other | Admitting: Dietician

## 2021-03-17 ENCOUNTER — Encounter: Payer: Medicare Other | Admitting: Internal Medicine

## 2021-03-20 ENCOUNTER — Other Ambulatory Visit: Payer: Self-pay | Admitting: Internal Medicine

## 2021-03-20 DIAGNOSIS — E119 Type 2 diabetes mellitus without complications: Secondary | ICD-10-CM

## 2021-03-29 ENCOUNTER — Ambulatory Visit
Admission: RE | Admit: 2021-03-29 | Discharge: 2021-03-29 | Disposition: A | Discharge status: Home / Self Care | Payer: Medicare Other | Source: Ambulatory Visit | Attending: Internal Medicine | Admitting: Internal Medicine

## 2021-03-29 ENCOUNTER — Other Ambulatory Visit: Payer: Self-pay

## 2021-03-29 DIAGNOSIS — R928 Other abnormal and inconclusive findings on diagnostic imaging of breast: Secondary | ICD-10-CM

## 2021-04-08 ENCOUNTER — Other Ambulatory Visit: Payer: Self-pay

## 2021-04-08 DIAGNOSIS — E119 Type 2 diabetes mellitus without complications: Secondary | ICD-10-CM

## 2021-04-08 MED ORDER — HUMULIN 70/30 (70-30) 100 UNIT/ML ~~LOC~~ SUSP: 40.0000 [IU] | Freq: Two times a day (BID) | SUBCUTANEOUS | 1 refills | Status: DC

## 2021-04-18 ENCOUNTER — Telehealth: Payer: Self-pay | Admitting: Dietician

## 2021-04-18 NOTE — Telephone Encounter (Signed)
ADS calling for CGM order. It was faxed today to another number as it did not go through on Thursday, December 15. Faxed successfully today.

## 2021-04-26 ENCOUNTER — Other Ambulatory Visit: Payer: Self-pay | Admitting: Internal Medicine

## 2021-04-26 DIAGNOSIS — E119 Type 2 diabetes mellitus without complications: Secondary | ICD-10-CM

## 2021-04-30 ENCOUNTER — Other Ambulatory Visit: Payer: Self-pay | Admitting: Internal Medicine

## 2021-04-30 DIAGNOSIS — E119 Type 2 diabetes mellitus without complications: Secondary | ICD-10-CM

## 2021-05-03 NOTE — Telephone Encounter (Signed)
Called patient to let her know that paperwork is in for her CGM and to call to schedule an appointment when she gets it.

## 2021-05-06 ENCOUNTER — Other Ambulatory Visit: Payer: Self-pay | Admitting: Internal Medicine

## 2021-05-06 DIAGNOSIS — E119 Type 2 diabetes mellitus without complications: Secondary | ICD-10-CM

## 2021-05-06 DIAGNOSIS — E1342 Other specified diabetes mellitus with diabetic polyneuropathy: Secondary | ICD-10-CM

## 2021-05-06 NOTE — Progress Notes (Signed)
Placed order for diabetic and nutrition counseling.

## 2021-05-10 ENCOUNTER — Ambulatory Visit: Payer: Commercial Managed Care - HMO | Admitting: Dietician

## 2021-05-10 ENCOUNTER — Encounter: Payer: Self-pay | Admitting: Internal Medicine

## 2021-05-10 ENCOUNTER — Ambulatory Visit (INDEPENDENT_AMBULATORY_CARE_PROVIDER_SITE_OTHER): Payer: Commercial Managed Care - HMO | Admitting: Internal Medicine

## 2021-05-10 ENCOUNTER — Encounter: Payer: Self-pay | Admitting: Dietician

## 2021-05-10 VITALS — BP 130/87 | HR 86 | Temp 98.4°F | Wt 149.8 lb

## 2021-05-10 DIAGNOSIS — E119 Type 2 diabetes mellitus without complications: Secondary | ICD-10-CM

## 2021-05-10 DIAGNOSIS — E1342 Other specified diabetes mellitus with diabetic polyneuropathy: Secondary | ICD-10-CM | POA: Diagnosis not present

## 2021-05-10 DIAGNOSIS — E785 Hyperlipidemia, unspecified: Secondary | ICD-10-CM | POA: Diagnosis not present

## 2021-05-10 DIAGNOSIS — I10 Essential (primary) hypertension: Secondary | ICD-10-CM

## 2021-05-10 DIAGNOSIS — Z0000 Encounter for general adult medical examination without abnormal findings: Secondary | ICD-10-CM

## 2021-05-10 LAB — POCT GLYCOSYLATED HEMOGLOBIN (HGB A1C): Hemoglobin A1C: 7.3 % — AB (ref 4.0–5.6)

## 2021-05-10 LAB — GLUCOSE, CAPILLARY: Glucose-Capillary: 153 mg/dL — ABNORMAL HIGH (ref 70–99)

## 2021-05-10 MED ORDER — ROSUVASTATIN CALCIUM 20 MG PO TABS: 20.0000 mg | ORAL_TABLET | Freq: Every day | ORAL | 1 refills | Status: DC

## 2021-05-10 MED ORDER — EMPAGLIFLOZIN 10 MG PO TABS: 10.0000 mg | ORAL_TABLET | Freq: Every day | ORAL | 2 refills | Status: DC

## 2021-05-10 MED ORDER — NYSTATIN 100000 UNIT/GM EX POWD: CUTANEOUS | 0 refills | Status: DC

## 2021-05-10 MED ORDER — ATENOLOL 50 MG PO TABS: 50.0000 mg | ORAL_TABLET | Freq: Two times a day (BID) | ORAL | 1 refills | Status: DC

## 2021-05-10 MED ORDER — HUMULIN 70/30 (70-30) 100 UNIT/ML ~~LOC~~ SUSP: 30.0000 [IU] | Freq: Two times a day (BID) | SUBCUTANEOUS | 2 refills | Status: DC

## 2021-05-10 MED ORDER — GABAPENTIN 300 MG PO CAPS: 300.0000 mg | ORAL_CAPSULE | Freq: Two times a day (BID) | ORAL | 2 refills | Status: DC

## 2021-05-10 NOTE — Assessment & Plan Note (Addendum)
Hemoglobin A1c today 7.3% down from >14% in August 2022.  Patient currently taking Ozempic 0.5 mg weekly, Jardiance 10 mg/day, Humulin 30 units twice daily.  Had not increased to 40 units as instructed at last visit.  Rare symptomatic lows after which she eats a snack and feels better  Patient does not have her monitor today, though is checking at home.  Reports on average blood sugars are 150, low at 100, high at 160.  She is checking once in the morning and either before or after meals.  Discussed checking fasting glucose in 1 to 2 hours after each meal.  Patient denies polydipsia, tingling/burning/numbness in hands and feet, blurry vision.  Reports ~10 pound weight loss since November, per chart review 11 pound weight loss since November.  Patient also seen by Butch Penny today, patient reports has obtained continuous glucose monitor which was set up with Butch Penny today.  Patient reports making changes to adhere to diet, reports exercise 3 times per week walking in the neighborhood.  Blood sugars currently better controlled on current regimen with large improvement in hemoglobin from 4 months ago.  We do not have definitive CBG data today.  Since she has just had CGM placed and activated, we will have patient come back in 3 weeks to evaluate data and make medication changes based on these numbers.  We will continue current regimen for now.  Plan: -Continue Ozempic 0.5 weekly -Continue Jardiance 10 mg daily, refill today -Continue Humulin 30 mg twice daily, refill today -Continue gabapentin for neuropathy, refill today -Patient has eye care center picked out for eye exam, needs to schedule appointment -Follow-up in 3 months to review CBGs

## 2021-05-10 NOTE — Progress Notes (Signed)
[employer] Personal CGM [employer] time: [id number]   End time: [id number] Total time: 15 minutes TAMMY SAYNE was educated about the following:  -Getting to know device    Software engineer ) -Setting up device , trend arrows - sensor interaction with vitamin C: do not take more than 500 mg vitamin C daily -Setting alert profile (high alert  240 , low alert 80)  setting up reminders (reminders for 6x/day testing ) -Inserting sensor (patient applied the sensor on her right arm  herself before coming to the office today with minimal assist. It appeared to be working and in warm up when he left the office) -Calibrating- none required. - using meter when test blood sugar symbol appears Patient lft the office before completing her visit and training for her personal Continuous glucose monitoring Plan to contact her for a follow up visit at which time training should be competed and assessment of behavior changes that caused 11# weight loss. She should bring her reading glasses to all visits as she cannot read the reader screen without them.   The topics below were not completed today.  Ending sensor session -Trouble shooting -Tape guide, ability to upload from home with email address  -Reviewed insulin dosing from [place] Patient should be asked to store [employer] tech support and my contact information in her phone.   Debera Lat, RD 05/10/2021 11:54 AM.

## 2021-05-10 NOTE — Patient Instructions (Signed)
Thank you, Ms.Maudie Flakes for allowing [place] to provide your care today. Today we discussed:  Diabetes: Continue taking:  Ozempic 0.5 mg weekly injection Jardiance 10 mg once daily Humulin 30 units twice a day  We will see you back in 3 weeks to see how your blood sugars are doing now that you have a continuous glucose monitor turned on.  Health maintenance: Please make sure you:  1.  Schedule your eye exam with your desired location, let them know you need a diabetic eye exam 2.  Someone will call you to set up a DEXA scan (bone scan) 3.  Someone will call you to schedule a colonoscopy 4.  If you would like the shingles vaccine you can get at a pharmacy to get  I have ordered the following labs for you:   Lab Orders         POC Hbg A1C      My Chart Access: [email or web address]  Please follow-up in in 3 weeks.  Please make sure to arrive 15 minutes prior to your next appointment. If you arrive late, you may be asked to reschedule.    We look forward to seeing you next time. Please call our clinic at [phone] if you have any questions or concerns. The best time to call is Monday-Friday from 9am-4pm, but there is someone available 24/7. If after hours or the weekend, call the main hospital number and ask for the Internal Medicine Resident On-Call. If you need medication refills, please notify your pharmacy one week in advance and they will send [place] a request.   Thank you for letting us take part in your care. Wishing you the best!  Wayland Denis, MD 05/10/2021, 11:07 AM IM Resident, PGY-1

## 2021-05-10 NOTE — Assessment & Plan Note (Signed)
Blood pressure today 130/87.  Blood pressures currently well controlled on current regimen of atenolol 50 mg twice daily.  Plan: -Continue atenolol 50 mg twice daily, refill today

## 2021-05-10 NOTE — Assessment & Plan Note (Signed)
-  Patient declined COVID-vaccine, updated in epic -DEXA scan ordered today -New referral to gastroenterology for colonoscopy today -Patient has eye care center picked up for eye exam, she needs to schedule an appointment -Discussed shingles vaccine today, patient knows she can obtain this at her local pharmacy

## 2021-05-10 NOTE — Progress Notes (Signed)
CC: diabetes follow up  HPI:  Jennifer Ingram is a 71 y.o. female with a past medical history stated below and presents today for diabetes follow up. Please see problem based assessment and plan for additional details.  Past Medical History:  Diagnosis Date   Arthritis    bilateral knees and back;   Depression    on meds, working well   Diabetes mellitus without complication ([place]) [id number]   on meds   Heart palpitations    rapid   Hyperlipidemia    on meds   Hypertension    on meds    Current Outpatient Medications on File Prior to Visit  Medication Sig Dispense Refill   Accu-Chek Softclix Lancets lancets Check blood sugar 3 times a day 100 each 11   atenolol (TENORMIN) 50 MG tablet Take 1 tablet (50 mg total) by mouth 2 (two) times daily. 180 tablet 1   EUTHYROX 50 MCG tablet Take 1 tablet (50 mcg total) by mouth daily. 90 tablet 3   gabapentin (NEURONTIN) 300 MG capsule Take 1 capsule (300 mg total) by mouth 2 (two) times daily. 60 capsule 2   glucose blood (ACCU-CHEK GUIDE) test strip Check blood sugar 3 times per day 100 each 12   insulin NPH-regular Human (HUMULIN 70/30) (70-30) 100 UNIT/ML injection Inject 40 Units into the skin 2 (two) times daily. 24 mL 1   JARDIANCE 10 MG TABS tablet TAKE 1 TABLET BY MOUTH ONCE DAILY BEFORE BREAKFAST 90 tablet 2   nystatin (MYCOSTATIN/NYSTOP) powder APPLY 1 APPLICATION TOPICALLY THREE TIMES DAILY 15 g 0   OZEMPIC, 0.25 OR 0.5 MG/DOSE, 2 MG/1.5ML SOPN INJECT 0.5 MG SUBCUTANEOUSLY ONCE A WEEK 2 mL 0   PARoxetine (PAXIL) 40 MG tablet Take 1 tablet (40 mg total) by mouth daily after breakfast. 90 tablet 1   rosuvastatin (CRESTOR) 20 MG tablet Take 1 tablet (20 mg total) by mouth daily. 90 tablet 1   No current facility-administered medications on file prior to visit.    Family History  Problem Relation Age of Onset   Cancer Mother        leukemia   Cancer Father        lung   Breast cancer Neg Hx    Colon polyps Neg Hx     Crohn's disease Neg Hx    Esophageal cancer Neg Hx    Stomach cancer Neg Hx    Rectal cancer Neg Hx     Social History   Tobacco Use   Smoking status: Never   Smokeless tobacco: Never  Vaping Use   Vaping Use: Never used  Substance Use Topics   Alcohol use: No   Drug use: No   Review of Systems: ROS negative except for what is noted on the assessment and plan.  Vitals:   05/10/21 0949  BP: 130/87  Pulse: 86  Temp: 98.4 F (36.9 C)  TempSrc: Oral  SpO2: 99%  Weight: 149 lb 12.8 oz (67.9 kg)     Physical Exam: General: Well appearing elderly caucasian female, NAD HENT: normocephalic, atraumatic EYES: conjunctiva non-erythematous, no scleral icterus CV: regular rate, normal rhythm, no murmurs, rubs, gallops. No lower extremity edema. Pulmonary: normal work of breathing on RA, lungs clear to auscultation, no rales, wheezes, rhonchi Abdominal: non-distended, soft, non-tender to palpation, normal BS Skin: Warm and dry, no rashes or lesions Neurological: MS: awake, alert and oriented x3, normal speech and fund of knowledge Motor: moves all extremities antigravity Psych: normal affect  Assessment & Plan:   See Encounters Tab for problem based charting.  Patient discussed with Dr.  Solon Augusta, M.D. [facility], PGY-1 Pager: [phone] Date 05/10/2021 Time 11:07 AM

## 2021-05-11 ENCOUNTER — Telehealth: Payer: Self-pay | Admitting: Dietician

## 2021-05-11 NOTE — Telephone Encounter (Signed)
Left voicemail for return call to see if her Continuous glucose monitor is working and to schedule a follow up.

## 2021-05-12 NOTE — Progress Notes (Signed)
Internal Medicine Clinic Attending ? ?Case discussed with Dr. Zinoviev  At the time of the visit.  We reviewed the resident?s history and exam and pertinent patient test results.  I agree with the assessment, diagnosis, and plan of care documented in the resident?s note.  ?

## 2021-05-18 ENCOUNTER — Ambulatory Visit: Payer: Medicare Other | Admitting: Podiatry

## 2021-05-21 ENCOUNTER — Other Ambulatory Visit: Payer: Self-pay | Admitting: Internal Medicine

## 2021-05-21 DIAGNOSIS — E119 Type 2 diabetes mellitus without complications: Secondary | ICD-10-CM

## 2021-06-28 ENCOUNTER — Other Ambulatory Visit: Payer: Self-pay | Admitting: Internal Medicine

## 2021-06-28 DIAGNOSIS — E119 Type 2 diabetes mellitus without complications: Secondary | ICD-10-CM

## 2021-07-13 ENCOUNTER — Encounter: Payer: Commercial Managed Care - HMO | Admitting: Internal Medicine

## 2021-07-28 ENCOUNTER — Telehealth: Payer: Self-pay | Admitting: Dietician

## 2021-07-28 NOTE — Telephone Encounter (Signed)
Left voicemail for return call  

## 2021-08-10 ENCOUNTER — Ambulatory Visit (INDEPENDENT_AMBULATORY_CARE_PROVIDER_SITE_OTHER): Payer: Medicare Other | Admitting: Dietician

## 2021-08-10 ENCOUNTER — Encounter: Payer: Self-pay | Admitting: Dietician

## 2021-08-10 ENCOUNTER — Encounter: Payer: Commercial Managed Care - HMO | Admitting: Internal Medicine

## 2021-08-10 ENCOUNTER — Ambulatory Visit (INDEPENDENT_AMBULATORY_CARE_PROVIDER_SITE_OTHER): Payer: Medicare Other | Admitting: Internal Medicine

## 2021-08-10 VITALS — BP 123/82 | HR 93 | Temp 98.4°F | Wt 143.8 lb

## 2021-08-10 DIAGNOSIS — E119 Type 2 diabetes mellitus without complications: Secondary | ICD-10-CM

## 2021-08-10 DIAGNOSIS — E1342 Other specified diabetes mellitus with diabetic polyneuropathy: Secondary | ICD-10-CM

## 2021-08-10 DIAGNOSIS — L304 Erythema intertrigo: Secondary | ICD-10-CM

## 2021-08-10 DIAGNOSIS — E039 Hypothyroidism, unspecified: Secondary | ICD-10-CM

## 2021-08-10 DIAGNOSIS — E785 Hyperlipidemia, unspecified: Secondary | ICD-10-CM

## 2021-08-10 LAB — POCT GLYCOSYLATED HEMOGLOBIN (HGB A1C): Hemoglobin A1C: 7.5 % — AB (ref 4.0–5.6)

## 2021-08-10 LAB — GLUCOSE, CAPILLARY: Glucose-Capillary: 90 mg/dL (ref 70–99)

## 2021-08-10 NOTE — Progress Notes (Signed)
? ? ?Subjective:  ?CC: DM ? ?HPI: ? ?[name] is a 71 y.o. female with a past medical history stated below and presents today for DM. Please see problem based assessment and plan for additional details. ? ?Past Medical History:  ?Diagnosis Date  ? Arthritis   ? bilateral knees and back;  ? Depression   ? on meds, working well  ? Diabetes mellitus without complication ([place]) [id number]  ? on meds  ? Heart palpitations   ? rapid  ? Hyperlipidemia   ? on meds  ? Hypertension   ? on meds  ? ? ?Current Outpatient Medications on File Prior to Visit  ?Medication Sig Dispense Refill  ? Accu-Chek Softclix Lancets lancets Check blood sugar 3 times a day 100 each 11  ? atenolol (TENORMIN) 50 MG tablet Take 1 tablet (50 mg total) by mouth 2 (two) times daily. 180 tablet 1  ? empagliflozin (JARDIANCE) 10 MG TABS tablet Take 1 tablet (10 mg total) by mouth daily before breakfast. 90 tablet 2  ? EUTHYROX 50 MCG tablet Take 1 tablet (50 mcg total) by mouth daily. 90 tablet 3  ? gabapentin (NEURONTIN) 300 MG capsule Take 1 capsule (300 mg total) by mouth 2 (two) times daily. 90 capsule 2  ? glucose blood (ACCU-CHEK GUIDE) test strip Check blood sugar 3 times per day 100 each 12  ? insulin NPH-regular Human (HUMULIN 70/30) (70-30) 100 UNIT/ML injection Inject 30 Units into the skin 2 (two) times daily. 60 mL 2  ? OZEMPIC, 0.25 OR 0.5 MG/DOSE, 2 MG/1.5ML SOPN INJECT 0.5 MG SUBCUTANEOUSLY ONCE A WEEK 2 mL 6  ? PARoxetine (PAXIL) 40 MG tablet Take 1 tablet (40 mg total) by mouth daily after breakfast. 90 tablet 1  ? rosuvastatin (CRESTOR) 20 MG tablet Take 1 tablet (20 mg total) by mouth daily. 90 tablet 1  ? ?No current facility-administered medications on file prior to visit.  ? ? ?Family History  ?Problem Relation Age of Onset  ? Cancer Mother   ?     leukemia  ? Cancer Father   ?     lung  ? Breast cancer Neg Hx   ? Colon polyps Neg Hx   ? Crohn's disease Neg Hx   ? Esophageal cancer Neg Hx   ? Stomach cancer Neg Hx   ?  Rectal cancer Neg Hx   ? ? ?Social History  ? ?Socioeconomic History  ? Marital status: Single  ?  Spouse name: Not on file  ? Number of children: 1  ? Years of education: 41  ? Highest education level: 12th grade  ?Occupational History  ? Not on file  ?Tobacco Use  ? Smoking status: Never  ? Smokeless tobacco: Never  ?Vaping Use  ? Vaping Use: Never used  ?Substance and Sexual Activity  ? Alcohol use: No  ? Drug use: No  ? Sexual activity: Yes  ?Other Topics Concern  ? Not on file  ?Social History Narrative  ? Not on file  ? ?Social Determinants of Health  ? ?Financial Resource Strain: Not on file  ?Food Insecurity: No Food Insecurity  ? Worried About Charity fundraiser in the Last Year: Never true  ? Ran Out of Food in the Last Year: Never true  ?Transportation Needs: No Transportation Needs  ? Lack of Transportation (Medical): No  ? Lack of Transportation (Non-Medical): No  ?Physical Activity: Insufficiently Active  ? Days of Exercise per Week: 3 days  ?  Minutes of Exercise per Session: 30 min  ?Stress: Not on file  ?Social Connections: Moderately Isolated  ? Frequency of Communication with Friends and Family: More than three times a week  ? Frequency of Social Gatherings with Friends and Family: Once a week  ? Attends Religious Services: 1 to 4 times per year  ? Active Member of Clubs or Organizations: No  ? Attends Archivist Meetings: Never  ? Marital Status: Widowed  ?Intimate Partner Violence: Not on file  ? ? ?Review of Systems: ?ROS negative except for what is noted on the assessment and plan. ? ?Objective:  ? ?Vitals:  ? 08/10/21 0936  ?BP: 123/82  ?Pulse: 93  ?Temp: 98.4 ?F (36.9 ?C)  ?TempSrc: Oral  ?SpO2: 99%  ?Weight: 143 lb 12.8 oz (65.2 kg)  ? ? ?Physical Exam: ?Gen: A&O x3 and in no apparent distress, well appearing and nourished. ?Neck: no masses or nodules, AROM intact. ?MSK: Grossly normal AROM and strength x4 extremities. ?Skin: erythematous rash underneath right breast ?Neuro: No  focal deficits, grossly normal sensation and coordination.  ?Psych: Oriented x3 and responding appropriately. Intact memory, normal mood, judgement, affect, and insight.  ? ? ?Assessment & Plan:  ?See Encounters Tab for problem based charting. ? ?Patient discussed with Dr. Jimmye Norman ? ? ?Marianna Payment, D.O. ?[facility]  PGY-3 ?Pager: [phone]  Phone: [phone] ?Date 08/11/2021  Time 1:47 PM  ? ? ? ?

## 2021-08-10 NOTE — Patient Instructions (Signed)
Thank you, Ms.[name] for allowing [place] to provide your care today. Today we discussed diabetes, cholesterol, colonoscopy.   ? ?Labs/Tests Ordered: ? ?Lab Orders    ?     POC Hbg A1C     ? ?Referrals Ordered:  ?Referral Orders  ?No referral(s) requested today  ?  ? ?Medication Changes:  ?There are no discontinued medications.  ? ?No orders of the defined types were placed in this encounter. ?  ? ?Health Maintenance Screening: ?Diabetes Health Maintenance Due  ?Topic Date Due  ? OPHTHALMOLOGY EXAM  Never done  ? HEMOGLOBIN A1C  08/08/2021  ? URINE MICROALBUMIN  12/17/2021  ? FOOT EXAM  02/14/2022  ?  ? ?Instructions:  ? ? ?Follow up: 3 months  ? ?Remember: If you have any questions or concerns, call our clinic at [phone] or after hours call [phone] and ask for the internal medicine resident on call. ? ?Dellia Cloud, D.O. ?[facility] ? ? ? ?

## 2021-08-10 NOTE — Patient Instructions (Addendum)
Thank you for your visit today! ? ?Please check your blood sugar by wanding your sensor at least 5 times a day until we meet again ? Before meals and when you wake up and before you go to bed ? ? Keep the reader with you as much as possible and especially at night when sleeping. ? ?Also keep something with sugar in it at all times with you in case your blood sugar goes low Examples are mints, hard candy, raisins, fruit, granola bar, 1/2 c. Juice or sweet tea ? ? Physical activity goal is for 150 minutes of walking a week, you are are currently getting 90 minutes a day.  ? Jennifer Ingram ?[phone] ? ?

## 2021-08-10 NOTE — Progress Notes (Signed)
?Medical Nutrition Therapy   ? ?start time: 1015        End time: 1105 ?Total time: 50 minutes ? ? (Diabetes Management) ?Jennifer Ingram is a 71 y.o. female who presents to clinic for diabetes management with registered dietitian. ?Referred by Dr. Howie Ill in September for diabetes education and CGM ?ASSESSMENT: ?Patient has not used her CGM sensor for the past 3 months. She was trained on using it and placed one in January, however she states that it somehow did not work and she removed it and did not place another. She placed a sensor yesterday, but did not start it until she came to the office and had assistance in doing so today.  ? ?  She denies having any symptoms of low blood sugar. She likes the weight loss and says she stays thirsty from the jardiance. She is happy with her A1C of 7.5% but would like it to be <7%. She was able to state her diabetes medicines today without problems.  ? ?Her weight is decreased and she reports continues cutting back portions . ? ?Vitamins/Supplements: need to assess at future visit ?Nutrition-related Lab Values: A1C due again 03/19/21 ? ? ? ?DM health maintenance: ?Eye exam : scheduled for 08/24/21 with Dr. Katy Fitch ? Last dental exam: need to assess at future visit ? Medication-related issues: none noted today ? ?Weight History & Anthropometrics: Estimated body mass index is 29.04 kg/m? as calculated from the following: ?  Height as of 03/03/21: 4\' 11"  (1.499 m). ?  Weight as of an earlier encounter on 08/10/21: 143 lb 12.8 oz (65.2 kg). ? ?Wt Readings from Last 6 Encounters:  ?08/10/21 143 lb 12.8 oz (65.2 kg)  ?05/10/21 149 lb 12.8 oz (67.9 kg)  ?03/03/21 160 lb (72.6 kg)  ?02/08/21 160 lb (72.6 kg)  ?02/08/21 160 lb 14.4 oz (73 kg)  ?01/11/21 165 lb 12.8 oz (75.2 kg)  ? ?24-hr recall suggests intake of 1500 kcal:  ?(Up at  AM) ?B ( 8 AM)- 1 slice bread with peanut butter, coffee and water, milk ?Snk ( AM)- fruit  ?L ( 1230 PM)- meatloaf , baked potato, tea sweet with  sugar  ?Snk ( PM)- oreo x1, tea ?D ( 6 PM)- fruit and fiber cereal and milk  ?Snk ( PM)- diet dr pepper  ?Typical day? Yes.   ? ?X3 Broccoli, vegetable soup ? ?Blood sugar on CGM reader today at visit is 76-79 after warm up. Symbol indicated to confirm with her meter but she did not bring strips with her today. Her meter showed average of 3 readings over past 30 days 199.  ? ?Physical Activity:walks 30 minutes 3 days a week, podiatry told hr she doe snot have neuropathy but arthritis in her feet ? ?Areas for intervention: assess diabetes knowledge and build confidence ?Special Dietary Needs: Consistent Carbohydrate Diet ? ?NUTRITION DIAGNOSIS: Altered nutrition-related lab values related to endocrine dysfunction has improved  as evidenced by elevated BG 79 and A1C 7.5%.  ? ?INTERVENTION: Completed assessment of social determinants of health. Reviewed current diet/exercise.  CGM meter education and  After visit summary ? ? ?MONITORING/EVALUATION:  ?Patient Goals:  ?1. Diabetes monitoring: before meals -50% of the time ?a. A1C: Goal is <7.0. Patient is currently above  goal with last A1c of 7.5% ?b. BG: Goal is 80-130 (pre-prandial) or <180 (post-prandial). Unable to assess ?c. Weight: Goal is  weight loss goal of 5-7% achieved. Patient desires further weight loss to 125-130# ?d. CVD Risk: Goal  is BP <140/90. Patient is  currently at goal.   ?2. Dietary changes: eat a vegetable daily ?3. Fluid intake goal: drink eight 8oz glasses of water per day.  ? ?Follow up: 1 month for personal  cgm training and fu MNT ?Butch Penny Amad Mau, CDCES ?08/10/21 ? ? ? ? ? ? ?

## 2021-08-11 ENCOUNTER — Encounter: Payer: Self-pay | Admitting: Internal Medicine

## 2021-08-11 MED ORDER — NYSTATIN 100000 UNIT/GM EX POWD: CUTANEOUS | 0 refills | Status: DC

## 2021-08-11 NOTE — Assessment & Plan Note (Signed)
Repeat TSH at next appointment.  ?

## 2021-08-11 NOTE — Assessment & Plan Note (Signed)
Patient has a recurrence of intertrigo underneath her right breast without any open wounds. I will refill nystatin today.  ?

## 2021-08-11 NOTE — Assessment & Plan Note (Signed)
Patient has a history of HLD and recently started of rosuvastatin several months ago. ? ?Lipid Panel  ?   ?Component Value Date/Time  ? CHOL 201 (H) 12/17/2020 1103  ? TRIG 277 (H) 12/17/2020 1103  ? HDL 28 (L) 12/17/2020 1103  ? CHOLHDL 7.2 (H) 12/17/2020 1103  ? LDLCALC 124 (H) 12/17/2020 1103  ? LABVLDL 49 (H) 12/17/2020 1103  ? ? ? ?Plan: ?- Will need repeat lipid panel at next visit ? ?

## 2021-08-11 NOTE — Assessment & Plan Note (Signed)
HPI: Patient presents for follow-up for diabetes.  Patient has longstanding diabetes with minimal proteinuria.  She does not have evidence of chronic kidney disease with likely a component of diabetic nephropathy.  She is currently on multiple medications including insulin and tolerating them well. She denies any side effects or symptoms of hypoglycemia.   ? ?She is currently on the following medications and tolerating them well: ?-Continue Ozempic 0.5 weekly ?-Continue Jardiance 10 mg daily ?-Continue Humulin 30 mg twice daily ? ?She continues to have some LE neuropathy without progression; ?Saw the podiatrist a couple of months ago ?Eye appt for 4/26   ? ? ?  Latest Ref Rng & Units 08/10/2021  ? 10:10 AM 05/10/2021  ? 10:08 AM 12/17/2020  ? 11:16 AM  ?Hemoglobin A1C  ?Hemoglobin-A1c 4.0 - 5.6 % 7.5   7.3   >14.0    ? ? ?Recently had a CBG place by [name] a couple months ago but has not been using it. Therefore, we do not have any data to make changes to her insulin regimen.  ? ?Plan:  ?- cont current meds ?- Has appt today with donna ?- follow up in 3 months ?- A1c was performed today.  ?

## 2021-08-24 NOTE — Progress Notes (Signed)
Internal Medicine Clinic Attending  Case discussed with Dr. Coe  At the time of the visit.  We reviewed the resident's history and exam and pertinent patient test results.  I agree with the assessment, diagnosis, and plan of care documented in the resident's note.  

## 2021-09-05 ENCOUNTER — Telehealth: Payer: Self-pay | Admitting: *Deleted

## 2021-09-05 NOTE — Telephone Encounter (Signed)
Call from [place], 0.25 OR 0.5 MG/DOSE, 2 MG/1.5ML SOPN is not available -send new rx for 2 mg/3 ml. ?Thanks ?

## 2021-09-12 ENCOUNTER — Other Ambulatory Visit: Payer: Self-pay | Admitting: Internal Medicine

## 2021-09-12 DIAGNOSIS — E119 Type 2 diabetes mellitus without complications: Secondary | ICD-10-CM

## 2021-09-13 ENCOUNTER — Encounter: Payer: Medicare Other | Admitting: Dietician

## 2021-09-14 ENCOUNTER — Other Ambulatory Visit: Payer: Self-pay

## 2021-09-14 DIAGNOSIS — E119 Type 2 diabetes mellitus without complications: Secondary | ICD-10-CM

## 2021-09-14 NOTE — Telephone Encounter (Signed)
gabapentin (NEURONTIN) 300 MG capsule  ?[employer], [place], [place] [address]  ?Phone:  [phone]  Fax:  [phone]  ?

## 2021-09-15 MED ORDER — GABAPENTIN 300 MG PO CAPS: 300.0000 mg | ORAL_CAPSULE | Freq: Two times a day (BID) | ORAL | 0 refills | Status: DC

## 2021-10-23 DIAGNOSIS — E1142 Type 2 diabetes mellitus with diabetic polyneuropathy: Secondary | ICD-10-CM | POA: Diagnosis not present

## 2021-10-25 ENCOUNTER — Telehealth: Payer: Self-pay

## 2021-10-25 NOTE — Telephone Encounter (Signed)
Soni with [place] [facility] requesting to speak with a nurse about abnormal result on leg when she did the PAD screening. The left leg result is Mild. Please call back.

## 2021-10-25 NOTE — Telephone Encounter (Signed)
Return call to [facility] NP with [facility]. Stated PAD screening was done today. Left leg was abnormal - +mild PAD. Right leg was normal. Stated a report will be sent in 1-2 weeks.

## 2021-10-26 ENCOUNTER — Other Ambulatory Visit: Payer: Self-pay | Admitting: Internal Medicine

## 2021-10-26 DIAGNOSIS — F32A Depression, unspecified: Secondary | ICD-10-CM

## 2021-10-26 NOTE — Telephone Encounter (Signed)
Thanks for the update

## 2021-11-14 ENCOUNTER — Other Ambulatory Visit: Payer: Self-pay | Admitting: Internal Medicine

## 2021-11-14 DIAGNOSIS — I10 Essential (primary) hypertension: Secondary | ICD-10-CM

## 2021-11-17 ENCOUNTER — Other Ambulatory Visit: Payer: Self-pay | Admitting: Internal Medicine

## 2021-11-17 DIAGNOSIS — E119 Type 2 diabetes mellitus without complications: Secondary | ICD-10-CM

## 2021-11-22 ENCOUNTER — Other Ambulatory Visit: Payer: Self-pay | Admitting: Internal Medicine

## 2021-11-23 DIAGNOSIS — E1142 Type 2 diabetes mellitus with diabetic polyneuropathy: Secondary | ICD-10-CM | POA: Diagnosis not present

## 2021-11-30 ENCOUNTER — Other Ambulatory Visit: Payer: Self-pay

## 2021-11-30 ENCOUNTER — Ambulatory Visit (INDEPENDENT_AMBULATORY_CARE_PROVIDER_SITE_OTHER): Payer: Medicare Other | Admitting: Student

## 2021-11-30 ENCOUNTER — Encounter: Payer: Self-pay | Admitting: Student

## 2021-11-30 VITALS — BP 116/76 | HR 90 | Temp 97.9°F | Resp 32 | Ht 59.0 in | Wt 147.2 lb

## 2021-11-30 DIAGNOSIS — Z794 Long term (current) use of insulin: Secondary | ICD-10-CM

## 2021-11-30 DIAGNOSIS — E785 Hyperlipidemia, unspecified: Secondary | ICD-10-CM

## 2021-11-30 DIAGNOSIS — R0989 Other specified symptoms and signs involving the circulatory and respiratory systems: Secondary | ICD-10-CM | POA: Diagnosis not present

## 2021-11-30 DIAGNOSIS — E1342 Other specified diabetes mellitus with diabetic polyneuropathy: Secondary | ICD-10-CM | POA: Diagnosis not present

## 2021-11-30 LAB — POCT GLYCOSYLATED HEMOGLOBIN (HGB A1C): Hemoglobin A1C: 9 % — AB (ref 4.0–5.6)

## 2021-11-30 LAB — GLUCOSE, CAPILLARY: Glucose-Capillary: 116 mg/dL — ABNORMAL HIGH (ref 70–99)

## 2021-11-30 MED ORDER — SEMAGLUTIDE (1 MG/DOSE) 4 MG/3ML ~~LOC~~ SOPN: 1.0000 mg | PEN_INJECTOR | SUBCUTANEOUS | 5 refills | Status: DC

## 2021-11-30 MED ORDER — TRESIBA FLEXTOUCH 100 UNIT/ML ~~LOC~~ SOPN: 20.0000 [IU] | PEN_INJECTOR | Freq: Every day | SUBCUTANEOUS | 3 refills | Status: DC

## 2021-11-30 MED ORDER — PEN NEEDLES 31G X 5 MM MISC: 1.0000 | Freq: Every day | 3 refills | Status: DC

## 2021-11-30 NOTE — Assessment & Plan Note (Signed)
Patient reports that you [facility] nurse performed PAD screening.  Information from nurse reports upper limit of normal/mildly abnormal L foot circulation. Patient denies symptoms of claudication at rest or with activity, or non healing ulcers. On exam, PD pulses are palpable bilaterally without asymmetry. Discussed mitigation of risk factors with patient; she currently exercises without claudication concerns. Will obtain a repeat lipid panel today to ensure optimal LDL and statin dose. Patient's DM management changes underway for better glycemic control. BP is controlled today. Symptomatic therapy not needed at this time.  -Follow up lipid panel -Continue to monitor

## 2021-11-30 NOTE — Patient Instructions (Signed)
Thank you, Ms.[name] for allowing [place] to provide your care today. Today we discussed diabetes and your circulation problems. We also made some changes to your medication:  Ozempic changes: We are increasing your dose to 1 mg per week. You usually inject this medication on Saturdays  Stop using: insulin 70/30 STOP this medication on Friday August 4th.  Start using: Tresiba (new insulin pen): 20 units in the morning only. Start this medication on Friday August 4th.  Continue taking Jardiance before meals every morning.  PLEASE remember to wear your glucose  meter in your arm every day. We need this information to know if your medication is working.   We are testing your cholesterol to make sure your levels are not going up. If they are up compared to before, we may need to make changes to your medication.   I have ordered the following labs for you:  Lab Orders         Lipid Profile         Glucose, capillary         POC Hbg A1C     I will call if any are abnormal. All of your labs can be accessed through "My Chart".   I have ordered the following medication/changed the following medications:   My Chart Access: [email or web address]  Please follow-up in in 2 weeks for diabetes follow up.  Please make sure to arrive 15 minutes prior to your next appointment. If you arrive late, you may be asked to reschedule.    We look forward to seeing you next time. Please call our clinic at [phone] if you have any questions or concerns. The best time to call is Monday-Friday from 9am-4pm, but there is someone available 24/7. If after hours or the weekend, call the main hospital number and ask for the Internal Medicine Resident On-Call. If you need medication refills, please notify your pharmacy one week in advance and they will send [place] a request.   Thank you for letting us take part in your care. Wishing you the best!  Morene Crocker,  MD 11/30/2021, 3:36 PM [facility] Internal Medicine Resident, PGY-1

## 2021-11-30 NOTE — Assessment & Plan Note (Signed)
Last LDL 121 on 12/20/2020.  We will repeat lipid panel today and assess medication changes. -Follow-up lipid panel results

## 2021-11-30 NOTE — Progress Notes (Signed)
   CC: diabetes check up  HPI:  [name] is a 71 y.o. with PMHx below presenting to clinic for diabetes follow-up  Past Medical History:  Diagnosis Date   Arthritis    bilateral knees and back;   Depression    on meds, working well   Diabetes mellitus without complication (HCC) 05/31/2012   on meds   Heart palpitations    rapid   Hyperlipidemia    on meds   Hypertension    on meds   Review of Systems:  Please refer to problem based assessment and plan section  Physical Exam:  Vitals:   11/30/21 1439  BP: 116/76  Pulse: 90  Resp: (!) 32  Temp: 97.9 F (36.6 C)  TempSrc: Oral  SpO2: 100%  Weight: 147 lb 3.2 oz (66.8 kg)  Height: 4\' 11"  (1.499 m)   General: Pleasant, well-appearing sitting on office bed in NAD CV: RRR. No murmurs, rubs, or gallops. No LE edema Pulmonary: Lungs CTAB. Normal effort. No wheezing or rales. Abdominal: Soft, nontender, nondistended. Normal bowel sounds. Extremities: Palpable radial and DP pulses. No Asymmetry in PD on exam. Normal ROM. Skin: Warm and dry. No obvious rash or lesions. No ulcer or rashes noted on feel or lower extremities. Neuro: A&Ox3. Moves all extremities. Normal sensation. No focal deficit. Psych: Normal mood and affect  Assessment & Plan:   See Encounters Tab for problem based charting. Jennifer Ingram was seen today for follow-up.  Diagnoses and all orders for this visit:  Diabetic polyneuropathy associated with other specified diabetes mellitus (HCC) -     POC Hbg A1C -     Glucose, capillary  Hyperlipidemia, unspecified hyperlipidemia type -     Lipid Profile  Diminished pulses in lower extremity  Other orders -     insulin degludec (TRESIBA FLEXTOUCH) 100 UNIT/ML FlexTouch Pen; Inject 20 Units into the skin daily. -     Insulin Pen Needle (PEN NEEDLES) 31G X 5 MM MISC; 1 each by Does not apply route daily. -     Semaglutide, 1 MG/DOSE, 4 MG/3ML SOPN; Inject 1 mg into the skin once a week.   Patient seen  with Dr. Leawood Sink

## 2021-11-30 NOTE — Assessment & Plan Note (Signed)
Patient presenting for Diabetes management. Patient reports she has been compliant with Jardiance and Ozempic. However, she has been compliant with her morning dose of insulin but often forgets to take the nighttime dose .  Patient also reports she has been indulging in ice cream treats during the past months.  Of note, patient with CGM but not consistently wearing it. Patient report a couple of hypoglycemic episodes during the past month; she is able to recognize them and drink orange juice.  Patient denied worsening tingling in his coronary and distal extremities since starting gabapentin.   Today A1c is 9.0.,  Compared to 7.5 3 months ago.  Given patient's A1c and difficulty remembering medications, will make some medication changes today.  Patient has tolerated Ozempic without GI side effects; will increase to 1 mg today.  Additionally we will switch patient from 70/30 insulin to [place].  Suspect patient was not taking 60 total units of 70/30/day, thus will start Tresiba dose at 20 units daily.  Patient is to continue Jardiance as prescribed before.  Encouraged patient to wear CGM as we will need to monitor her blood glucose with the medication changes.  - Stop insulin 70/30 - Start Tresiba 20 u in the AM - Increase Ozempic to 1mg  weekly - Continue Jardiance 10mg  - Continue Gabapentin 300 mg BID - Return to clinic in 2 weeks for f/u

## 2021-12-01 NOTE — Progress Notes (Signed)
Internal Medicine Clinic Attending  I saw and evaluated the patient.  I personally confirmed the key portions of the history and exam documented by the resident  and I reviewed pertinent patient test results.  The assessment, diagnosis, and plan were formulated together and I agree with the documentation in the resident's note.  

## 2021-12-14 ENCOUNTER — Encounter: Payer: Medicare Other | Admitting: Student

## 2021-12-22 ENCOUNTER — Ambulatory Visit (INDEPENDENT_AMBULATORY_CARE_PROVIDER_SITE_OTHER): Payer: Medicare Other | Admitting: Student

## 2021-12-22 ENCOUNTER — Encounter: Payer: Self-pay | Admitting: Student

## 2021-12-22 DIAGNOSIS — E1142 Type 2 diabetes mellitus with diabetic polyneuropathy: Secondary | ICD-10-CM

## 2021-12-22 DIAGNOSIS — Z794 Long term (current) use of insulin: Secondary | ICD-10-CM | POA: Diagnosis not present

## 2021-12-22 DIAGNOSIS — Z0000 Encounter for general adult medical examination without abnormal findings: Secondary | ICD-10-CM

## 2021-12-22 MED ORDER — TRESIBA FLEXTOUCH 100 UNIT/ML ~~LOC~~ SOPN: 30.0000 [IU] | PEN_INJECTOR | Freq: Every day | SUBCUTANEOUS | 3 refills | Status: DC

## 2021-12-22 NOTE — Progress Notes (Signed)
Subjective:  CC: diabetes follow up  HPI:  [name] is a 71 y.o. female with a past medical history stated below and presents today for diabetes follow up. Please see problem based assessment and plan for additional details.  Past Medical History:  Diagnosis Date   Arthritis    bilateral knees and back;   Depression    on meds, working well   Diabetes mellitus without complication (HCC) 05/31/2012   on meds   Heart palpitations    rapid   Hyperlipidemia    on meds   Hypertension    on meds    Current Outpatient Medications on File Prior to Visit  Medication Sig Dispense Refill   Accu-Chek Softclix Lancets lancets Check blood sugar 3 times a day 100 each 11   atenolol (TENORMIN) 50 MG tablet Take 1 tablet by mouth twice daily 180 tablet 3   empagliflozin (JARDIANCE) 10 MG TABS tablet Take 1 tablet (10 mg total) by mouth daily before breakfast. 90 tablet 2   EUTHYROX 50 MCG tablet Take 1 tablet (50 mcg total) by mouth daily. 90 tablet 3   gabapentin (NEURONTIN) 300 MG capsule Take 1 capsule by mouth twice daily 180 capsule 3   glucose blood (ACCU-CHEK GUIDE) test strip Check blood sugar 3 times per day 100 each 12   Insulin Pen Needle (PEN NEEDLES) 31G X 5 MM MISC 1 each by Does not apply route daily. 90 each 3   nystatin (MYCOSTATIN/NYSTOP) powder APPLY 1 APPLICATION TOPICALLY 3 TIMES A DAY 15 g 0   PARoxetine (PAXIL) 40 MG tablet TAKE 1 TABLET BY MOUTH ONCE DAILY AFTER BREAKFAST 90 tablet 1   rosuvastatin (CRESTOR) 20 MG tablet Take 1 tablet (20 mg total) by mouth daily. 90 tablet 1   Semaglutide, 1 MG/DOSE, 4 MG/3ML SOPN Inject 1 mg into the skin once a week. 3 mL 5   No current facility-administered medications on file prior to visit.    Family History  Problem Relation Age of Onset   Cancer Mother        leukemia   Cancer Father        lung   Breast cancer Neg Hx    Colon polyps Neg Hx    Crohn's disease Neg Hx    Esophageal cancer Neg Hx    Stomach  cancer Neg Hx    Rectal cancer Neg Hx     Social History   Socioeconomic History   Marital status: Single    Spouse name: Not on file   Number of children: 1   Years of education: 12   Highest education level: 12th grade  Occupational History   Not on file  Tobacco Use   Smoking status: Never   Smokeless tobacco: Never  Vaping Use   Vaping Use: Never used  Substance and Sexual Activity   Alcohol use: No   Drug use: No   Sexual activity: Yes  Other Topics Concern   Not on file  Social History Narrative   Not on file   Social Determinants of Health   Financial Resource Strain: Not on file  Food Insecurity: No Food Insecurity (08/10/2021)   Hunger Vital Sign    Worried About Running Out of Food in the Last Year: Never true    Ran Out of Food in the Last Year: Never true  Transportation Needs: No Transportation Needs (08/10/2021)   PRAPARE - Administrator, Civil Service (Medical): No  Lack of Transportation (Non-Medical): No  Physical Activity: Insufficiently Active (08/10/2021)   Exercise Vital Sign    Days of Exercise per Week: 3 days    Minutes of Exercise per Session: 30 min  Stress: Not on file  Social Connections: Moderately Isolated (08/10/2021)   Social Connection and Isolation Panel [NHANES]    Frequency of Communication with Friends and Family: More than three times a week    Frequency of Social Gatherings with Friends and Family: Once a week    Attends Religious Services: 1 to 4 times per year    Active Member of [employer] or Organizations: No    Attends Banker Meetings: Never    Marital Status: Widowed  Intimate Partner Violence: Not on file    Review of Systems: ROS negative except for what is noted on the assessment and plan.  Objective:   Vitals:   12/22/21 1416  BP: 108/73  Pulse: 93  Temp: 97.8 F (36.6 C)  TempSrc: Oral  SpO2: 96%  Weight: 148 lb (67.1 kg)  Height: 4\' 11"  (1.499 m)    Physical  Exam: Constitutional: well-appearing woman sitting in chair, in no acute distress HENT: normocephalic atraumatic, mucous membranes moist Eyes: conjunctiva non-erythematous Neck: supple Cardiovascular: regular rate and rhythm, no m/r/g Pulmonary/Chest: normal work of breathing on room air, lungs clear to auscultation bilaterally Abdominal: soft, non-tender, non-distended MSK: normal bulk and tone Neurological: alert & oriented x 3 Skin: warm and dry Psych: Pleasant mood and affect     Assessment & Plan:   Diabetes mellitus with polyneuropathy [facility]) Patient presenting for diabetes management. Last A1c was 9% on 10/2021. Last OV patient was switched from Insulin 70/30, which she was taking 30 units in the AM and sometimes 30u at night) to 06-19-1969 20 units daily and increased Ozempic dose from 0.5 to 1 mg weekly. Patient has been compliant with Jardiance and Ozempic and denies GI or urinary side effects. However, patient reports that she has been using glucose meter consistently and noted that her BG continued to be >140 in the mornings after starting tresiba. She then self-increased her dose to 40 units daily for the next week, which was not enough to bring her BG down. Patient mentioned she then switched to her old dose of 70/30, with 30 u in the AM and sometimes 30 over the past two weeks. Patient reports feeling as though her blood glucose was low in the mornings multiple times this past month.  Patient brought glucose meter too this OV. The first week after Tresiba 20 u was started does show elevated fasting BG. However, the following week, which is when patient reports increasing Tresiba dose to 40 units daily, patient has hypoglycemic has 4 hypoglycemic episodes, with average BG 90-132. The period when patient reported taking 70/30 had variable glucose readings, mostly above goal.  Will plant to have patient continue Ozempic 1mg  and Jardiance 10 mg, and switch 11-05-1966 to 30 units. Will plan to  follow up in one month to monitor blood glucose.    Healthcare maintenance Discuss colonoscopy screening, but patient deferred. She already has referral, but mentioned she would called [place].    Patient seen with Dr. 

## 2021-12-22 NOTE — Assessment & Plan Note (Signed)
Discuss colonoscopy screening, but patient deferred. She already has referral, but mentioned she would called [facility].

## 2021-12-22 NOTE — Assessment & Plan Note (Addendum)
Patient presenting for diabetes management. Last A1c was 9% on 10/2021. Last OV patient was switched from Insulin 70/30, which she was taking 30 units in the AM and sometimes 30u at night) to [place] 20 units daily and increased Ozempic dose from 0.5 to 1 mg weekly. Patient has been compliant with Jardiance and Ozempic and denies GI or urinary side effects. However, patient reports that she has been using glucose meter consistently and noted that her BG continued to be >140 in the mornings after starting tresiba. She then self-increased her dose to 40 units daily for the next week, which was not enough to bring her BG down. Patient mentioned she then switched to her old dose of 70/30, with 30 u in the AM and sometimes 30 over the past two weeks. Patient reports feeling as though her blood glucose was low in the mornings multiple times this past month.  Patient brought glucose meter too this OV. The first week after Tresiba 20 u was started does show elevated fasting BG. However, the following week, which is when patient reports increasing Tresiba dose to 40 units daily, patient has hypoglycemic has 4 hypoglycemic episodes, with average BG 90-132. The period when patient reported taking 70/30 had variable glucose readings, mostly above goal.  Will plant to have patient continue Ozempic 1mg  and Jardiance 10 mg, and switch to 30 units. Will plan to follow up in one month to monitor blood glucose.

## 2021-12-22 NOTE — Patient Instructions (Signed)
Thank you, Ms.[name] for allowing [place] to provide your care today. Today we discussed diabetes medications, and we are making the following change:  Please continue your Jardiance, the Ozempic, and your Gapapentin. Stop the Insulin 70/30 Re-start the Tresiba insulin: inject 30 units daily.   Call us back if you are having too many low blood sugars or are having symptoms like confusion, lightheadedness, loss of balance or not feeling like yourself.   My Chart Access: [email or web address]  Please follow-up in in 4 weeks.  Please make sure to arrive 15 minutes prior to your next appointment. If you arrive late, you may be asked to reschedule.    We look forward to seeing you next time. Please call our clinic at [phone] if you have any questions or concerns. The best time to call is Monday-Friday from 9am-4pm, but there is someone available 24/7. If after hours or the weekend, call the main hospital number and ask for the Internal Medicine Resident On-Call. If you need medication refills, please notify your pharmacy one week in advance and they will send [place] a request.   Thank you for letting us take part in your care. Wishing you the best!  Morene Crocker, MD 12/22/2021, 2:49 PM [facility] Internal Medicine Resident, PGY-1

## 2021-12-23 NOTE — Progress Notes (Signed)
Internal Medicine Clinic Attending  I saw and evaluated the patient.  I personally confirmed the key portions of the history and exam documented by Dr. Lily Kocher and I reviewed pertinent patient test results.  The assessment, diagnosis, and plan were formulated together and I agree with the documentation in the resident's note.   Tresiba 20 units was not enough, 40 units was too much. Agree with plan to try Tresiba 30 units daily

## 2021-12-24 DIAGNOSIS — E1142 Type 2 diabetes mellitus with diabetic polyneuropathy: Secondary | ICD-10-CM | POA: Diagnosis not present

## 2022-01-19 ENCOUNTER — Encounter: Payer: Medicare Other | Admitting: Internal Medicine

## 2022-01-19 ENCOUNTER — Telehealth: Payer: Self-pay | Admitting: Internal Medicine

## 2022-01-19 ENCOUNTER — Telehealth: Payer: Self-pay

## 2022-01-19 DIAGNOSIS — E1142 Type 2 diabetes mellitus with diabetic polyneuropathy: Secondary | ICD-10-CM

## 2022-01-19 MED ORDER — INSULIN NPH ISOPHANE & REGULAR (70-30) 100 UNIT/ML ~~LOC~~ SUSP: 30.0000 [IU] | Freq: Two times a day (BID) | SUBCUTANEOUS | 11 refills | Status: DC

## 2022-01-19 NOTE — Telephone Encounter (Signed)
Requesting to speak with Dr. Howie Ill about medication. Please call pt back.

## 2022-01-19 NOTE — Telephone Encounter (Signed)
Return pt's call. Stated Jennifer Ingram is not controlling her BS's even with the increase; BS's are running >200's. Pt wants to go back to Humulin 70/30; states she had extra and has been taking it and her BS's are controlled.

## 2022-01-19 NOTE — Telephone Encounter (Signed)
Call received from Ms. Lamar. She is concerned that insulin dose is not controlling blood sugar. Last a1c has increased from 7.5 to 9 in August. She was taking Humulin 30 units BID and this was switched to tresiba 30 units daily. Fasting blood sugars were up to 160-200 and she switched back to humulin 30 units BID a few days ago. Fasting glucose has been around 120-130s. She has not had any low blood sugars while taking humulin 30 units BID. P: Continue humulin 30 units BID, patient states that she does not want to be on tresiba as this does not seem to be working for her. She has several vials of humulin at home currently. I will message front desk to have patient scheduled for follow-up next week.

## 2022-01-24 DIAGNOSIS — E1142 Type 2 diabetes mellitus with diabetic polyneuropathy: Secondary | ICD-10-CM | POA: Diagnosis not present

## 2022-01-31 NOTE — Addendum Note (Signed)
Addended by: Simeon Craft, Verdis Frederickson on: 01/31/2022 03:07 AM   Modules accepted: Orders

## 2022-02-07 ENCOUNTER — Encounter: Payer: Medicare Other | Admitting: Internal Medicine

## 2022-02-24 DIAGNOSIS — E1142 Type 2 diabetes mellitus with diabetic polyneuropathy: Secondary | ICD-10-CM | POA: Diagnosis not present

## 2022-02-28 ENCOUNTER — Encounter: Payer: Self-pay | Admitting: Internal Medicine

## 2022-02-28 ENCOUNTER — Ambulatory Visit (INDEPENDENT_AMBULATORY_CARE_PROVIDER_SITE_OTHER): Payer: Medicare Other | Admitting: Internal Medicine

## 2022-02-28 ENCOUNTER — Telehealth: Payer: Self-pay

## 2022-02-28 VITALS — BP 121/80 | HR 88 | Temp 97.8°F | Ht 59.0 in | Wt 155.9 lb

## 2022-02-28 DIAGNOSIS — N1831 Chronic kidney disease, stage 3a: Secondary | ICD-10-CM | POA: Diagnosis not present

## 2022-02-28 DIAGNOSIS — E1121 Type 2 diabetes mellitus with diabetic nephropathy: Secondary | ICD-10-CM | POA: Diagnosis not present

## 2022-02-28 DIAGNOSIS — Z794 Long term (current) use of insulin: Secondary | ICD-10-CM | POA: Diagnosis not present

## 2022-02-28 DIAGNOSIS — E1122 Type 2 diabetes mellitus with diabetic chronic kidney disease: Secondary | ICD-10-CM

## 2022-02-28 DIAGNOSIS — E039 Hypothyroidism, unspecified: Secondary | ICD-10-CM

## 2022-02-28 DIAGNOSIS — Z7984 Long term (current) use of oral hypoglycemic drugs: Secondary | ICD-10-CM

## 2022-02-28 DIAGNOSIS — E1142 Type 2 diabetes mellitus with diabetic polyneuropathy: Secondary | ICD-10-CM | POA: Diagnosis not present

## 2022-02-28 LAB — GLUCOSE, CAPILLARY: Glucose-Capillary: 110 mg/dL — ABNORMAL HIGH (ref 70–99)

## 2022-02-28 LAB — POCT GLYCOSYLATED HEMOGLOBIN (HGB A1C): Hemoglobin A1C: 8.2 % — AB (ref 4.0–5.6)

## 2022-02-28 MED ORDER — OZEMPIC (2 MG/DOSE) 8 MG/3ML ~~LOC~~ SOPN: 2.0000 mg | PEN_INJECTOR | SUBCUTANEOUS | 6 refills | Status: DC

## 2022-02-28 MED ORDER — OZEMPIC (2 MG/DOSE) 8 MG/3ML ~~LOC~~ SOPN: PEN_INJECTOR | SUBCUTANEOUS | 6 refills | Status: DC

## 2022-02-28 MED ORDER — HUMULIN 70/30 KWIKPEN (70-30) 100 UNIT/ML ~~LOC~~ SUPN: PEN_INJECTOR | SUBCUTANEOUS | 11 refills | Status: DC

## 2022-02-28 NOTE — Telephone Encounter (Signed)
Jennifer Ingram with [facility] requesting to speak with a nurse about the direction on Semaglutide, 2 MG/DOSE, (OZEMPIC, 2 MG/DOSE,) 8 MG/3ML SOPN, please call back.

## 2022-02-28 NOTE — Telephone Encounter (Signed)
The pharmacy needs clarification on directions.

## 2022-02-28 NOTE — Progress Notes (Unsigned)
Subjective:  CC: diabetes management  HPI:  Jennifer Ingram is a 71 y.o. female with a past medical history stated below and presents today for diabetes management. Last A1c was elevated to 9 in august. She was concerned in September that tresiba was not controlling blood sugars as well as the humalin was. Please see problem based assessment and plan for additional details.  Past Medical History:  Diagnosis Date   Arthritis    bilateral knees and back;   Depression    on meds, working well   Diabetes mellitus without complication ([place]) 35/36/1443   on meds   Heart palpitations    rapid   Hyperlipidemia    on meds   Hypertension    on meds    Current Outpatient Medications on File Prior to Visit  Medication Sig Dispense Refill   Accu-Chek Softclix Lancets lancets Check blood sugar 3 times a day 100 each 11   atenolol (TENORMIN) 50 MG tablet Take 1 tablet by mouth twice daily 180 tablet 3   empagliflozin (JARDIANCE) 10 MG TABS tablet Take 1 tablet (10 mg total) by mouth daily before breakfast. 90 tablet 2   EUTHYROX 50 MCG tablet Take 1 tablet (50 mcg total) by mouth daily. 90 tablet 3   gabapentin (NEURONTIN) 300 MG capsule Take 1 capsule by mouth twice daily 180 capsule 3   glucose blood (ACCU-CHEK GUIDE) test strip Check blood sugar 3 times per day 100 each 12   insulin NPH-regular Human (70-30) 100 UNIT/ML injection Inject 30 Units into the skin 2 (two) times daily with a meal. 10 mL 11   Insulin Pen Needle (PEN NEEDLES) 31G X 5 MM MISC 1 each by Does not apply route daily. 90 each 3   nystatin (MYCOSTATIN/NYSTOP) powder APPLY 1 APPLICATION TOPICALLY 3 TIMES A DAY 15 g 0   PARoxetine (PAXIL) 40 MG tablet TAKE 1 TABLET BY MOUTH ONCE DAILY AFTER BREAKFAST 90 tablet 1   rosuvastatin (CRESTOR) 20 MG tablet Take 1 tablet (20 mg total) by mouth daily. 90 tablet 1   Semaglutide, 1 MG/DOSE, 4 MG/3ML SOPN Inject 1 mg into the skin once a week. 3 mL 5   No current  facility-administered medications on file prior to visit.    Family History  Problem Relation Age of Onset   Cancer Mother        leukemia   Cancer Father        lung   Breast cancer Neg Hx    Colon polyps Neg Hx    Crohn's disease Neg Hx    Esophageal cancer Neg Hx    Stomach cancer Neg Hx    Rectal cancer Neg Hx     Social History   Socioeconomic History   Marital status: Single    Spouse name: Not on file   Number of children: 1   Years of education: 12   Highest education level: 12th grade  Occupational History   Not on file  Tobacco Use   Smoking status: Never   Smokeless tobacco: Never  Vaping Use   Vaping Use: Never used  Substance and Sexual Activity   Alcohol use: No   Drug use: No   Sexual activity: Yes  Other Topics Concern   Not on file  Social History Narrative   Not on file   Social Determinants of Health   Financial Resource Strain: Not on file  Food Insecurity: No Food Insecurity (08/10/2021)   Hunger Vital Sign  Worried About Programme researcher, broadcasting/film/video in the Last Year: Never true    Ran Out of Food in the Last Year: Never true  Transportation Needs: No Transportation Needs (08/10/2021)   PRAPARE - Administrator, Civil Service (Medical): No    Lack of Transportation (Non-Medical): No  Physical Activity: Insufficiently Active (08/10/2021)   Exercise Vital Sign    Days of Exercise per Week: 3 days    Minutes of Exercise per Session: 30 min  Stress: Not on file  Social Connections: Moderately Isolated (08/10/2021)   Social Connection and Isolation Panel [NHANES]    Frequency of Communication with Friends and Family: More than three times a week    Frequency of Social Gatherings with Friends and Family: Once a week    Attends Religious Services: 1 to 4 times per year    Active Member of [employer] or Organizations: No    Attends Banker Meetings: Never    Marital Status: Widowed  Intimate Partner Violence: Not on file     Review of Systems: ROS negative except for what is noted on the assessment and plan.  Objective:  There were no vitals filed for this visit.  Physical Exam: Constitutional: well-appearing *** sitting in ***, in no acute distress HENT: normocephalic atraumatic, mucous membranes moist Eyes: conjunctiva non-erythematous Neck: supple Cardiovascular: regular rate and rhythm, no m/r/g Pulmonary/Chest: normal work of breathing on room air, lungs clear to auscultation bilaterally Abdominal: soft, non-tender, non-distended MSK: normal bulk and tone Neurological: alert & oriented x 3, 5/5 strength in bilateral upper and lower extremities, normal gait Skin: warm and dry Psych: ***     Assessment & Plan:  No problem-specific Assessment & Plan notes found for this encounter.    Patient discussed with Dr.    Rudene Christians, D.O. [facility] Internal Medicine  PGY-2 Pager: [phone]  Phone: [phone] Date 02/28/2022  Time 2:07 PM

## 2022-02-28 NOTE — Assessment & Plan Note (Addendum)
[place] 2 report reviewed. She is not scanning CGM frequently enough for percentages to be accurate. I talked with her about trying to scan at least every 8 hours. Assessment: hemoglobin A1c improved from 9-8.2.  Goal would be to have A1c less than 7.  Did have 2 episodes of hypoglycemia morning of October 31 into 50s.  She states that sometimes she eats less.  She was asymptomatic with episode. Plan: Decrease humalin from 30 units to 27 units Increase ozempic from 1 mg to 2 mg weekly, rx sent to pharmacy Follow-up in 4 weeks to review CGM report Repeat a1c due in 01/24 Microalbumin BMP Referral to opthalmology

## 2022-02-28 NOTE — Patient Instructions (Addendum)
Thank you, Ms.Maudie Flakes for allowing [place] to provide your care today.   Diabetes Please increase the amount that you are scanning your freestyle.  This will make the values I get more accurate.  You had low blood sugars and I would like to decrease the Humulin to 27 units twice daily.  I have sent in Humulin pens to make taking the insulin easier for you.  Also sent in a higher dose for Ozempic.  Continue take this once weekly.  Kidney disease Rechecking some blood work and urine to see how your kidneys are doing.  Thyroid I will call you with results of lab.  I have ordered the following labs for you:  Lab Orders         Glucose, capillary         Microalbumin / Creatinine Urine Ratio         BMP8+Anion Gap         TSH         T4, Free         POC Hbg A1C      I have ordered the following medication/changed the following medications:   Stop the following medications: Medications Discontinued During This Encounter  Medication Reason   Semaglutide, 1 MG/DOSE, 4 MG/3ML SOPN Dose change   insulin NPH-regular Human (70-30) 100 UNIT/ML injection Dose change     Start the following medications: Meds ordered this encounter  Medications   Semaglutide, 2 MG/DOSE, (OZEMPIC, 2 MG/DOSE,) 8 MG/3ML SOPN    Sig: Inject 1 time weekly    Dispense:  9 mL    Refill:  6   insulin isophane & regular human KwikPen (HUMULIN 70/30 KWIKPEN) (70-30) 100 UNIT/ML KwikPen    Sig: Inject 27 units 2 times daily    Dispense:  15 mL    Refill:  11     Follow up:  4 weeks  for diabetes  We look forward to seeing you next time. Please call our clinic at [phone] if you have any questions or concerns. The best time to call is Monday-Friday from 9am-4pm, but there is someone available 24/7. If after hours or the weekend, call the main hospital number and ask for the Internal Medicine Resident On-Call. If you need medication refills, please notify your pharmacy one week in advance and they will send [place]  a request.   Thank you for trusting me with your care. Wishing you the best!   Christiana Fuchs, [place]

## 2022-03-01 ENCOUNTER — Telehealth: Payer: Self-pay | Admitting: *Deleted

## 2022-03-01 LAB — BMP8+ANION GAP
Anion Gap: 14 mmol/L (ref 10.0–18.0)
BUN/Creatinine Ratio: 24 (ref 12–28)
BUN: 33 mg/dL — ABNORMAL HIGH (ref 8–27)
CO2: 23 mmol/L (ref 20–29)
Calcium: 9.1 mg/dL (ref 8.7–10.3)
Chloride: 102 mmol/L (ref 96–106)
Creatinine, Ser: 1.35 mg/dL — ABNORMAL HIGH (ref 0.57–1.00)
Glucose: 165 mg/dL — ABNORMAL HIGH (ref 70–99)
Potassium: 5 mmol/L (ref 3.5–5.2)
Sodium: 139 mmol/L (ref 134–144)
eGFR: 43 mL/min/{1.73_m2} — ABNORMAL LOW (ref >59)

## 2022-03-01 LAB — MICROALBUMIN / CREATININE URINE RATIO
Creatinine, Urine: 93.3 mg/dL
Microalb/Creat Ratio: 10 mg/g creat (ref 0–29)
Microalbumin, Urine: 9.4 ug/mL

## 2022-03-01 LAB — T4, FREE: Free T4: 1.03 ng/dL (ref 0.82–1.77)

## 2022-03-01 LAB — TSH: TSH: 1.89 u[IU]/mL (ref 0.450–4.500)

## 2022-03-01 MED ORDER — OZEMPIC (2 MG/DOSE) 8 MG/3ML ~~LOC~~ SOPN: 2.0000 mg | PEN_INJECTOR | SUBCUTANEOUS | 6 refills | Status: DC

## 2022-03-01 NOTE — Assessment & Plan Note (Signed)
Patient with history of CKD stage IIIa.  GFR at 41 and September 2022.  Likely CKD in the setting of uncontrolled type 2 diabetes. Plan: Repeat BMP with GFR of 43.

## 2022-03-01 NOTE — Telephone Encounter (Signed)
Pt had called - stated Ozempic rx needs to be changed. I called [facility] - talked to the pharmacist. He stated last Ozempic filled was on 10/17 for 0.5mg . They need rx for 1.0 mg - he stated the insurance co will not cover 2.0 mg until she has been on the 1.0 mg. Please send new rx  Thanks

## 2022-03-01 NOTE — Assessment & Plan Note (Signed)
Repeat TSH at follow-up in 4 weeks.

## 2022-03-01 NOTE — Assessment & Plan Note (Signed)
>>  ASSESSMENT AND PLAN FOR CHRONIC KIDNEY DISEASE WRITTEN ON 03/01/2022  6:19 PM BY Daliah Chaudoin, DO  Patient with history of CKD stage IIIa.  GFR at 41 and September 2022.  Likely CKD in the setting of uncontrolled type 2 diabetes. Plan: Repeat BMP with GFR of 43.

## 2022-03-02 MED ORDER — SEMAGLUTIDE (1 MG/DOSE) 4 MG/3ML ~~LOC~~ SOPN: 1.0000 mg | PEN_INJECTOR | SUBCUTANEOUS | 3 refills | Status: DC

## 2022-03-02 NOTE — Progress Notes (Signed)
Internal Medicine Clinic Attending  Case discussed with Dr. Masters  At the time of the visit.  We reviewed the resident's history and exam and pertinent patient test results.  I agree with the assessment, diagnosis, and plan of care documented in the resident's note.  

## 2022-03-02 NOTE — Addendum Note (Signed)
Addended by: Edwyna Perfect on: 03/02/2022 11:09 AM   Modules accepted: Orders

## 2022-03-02 NOTE — Addendum Note (Signed)
Addended by: Jodean Lima on: 03/02/2022 02:58 PM   Modules accepted: Level of Service

## 2022-03-02 NOTE — Telephone Encounter (Signed)
I called [facility] about rx for 1.0 mg of Ozempic back in August; she stated rx was received but pt did not pick it up. And pt has been receiving 0.5 mg. [facility] ran 1.0 mg rx thru again and pt's insurance co will accept and able to fill the rx they had on file. I called the pt and asked her what dosage she has been taking; she stated 0.5 mg. Informed pt her doctor thought she had been taking the increase dose and  [facility] will get the 1.0 mg ready for her. And she needs to take the 1.0 mg and I will let her doctor know. Pt also requesting her lab results - please call. Thanks

## 2022-03-14 ENCOUNTER — Other Ambulatory Visit: Payer: Self-pay | Admitting: Internal Medicine

## 2022-03-14 DIAGNOSIS — L304 Erythema intertrigo: Secondary | ICD-10-CM

## 2022-03-14 DIAGNOSIS — E119 Type 2 diabetes mellitus without complications: Secondary | ICD-10-CM

## 2022-03-14 NOTE — Telephone Encounter (Signed)
Nystatin powder refilled  

## 2022-03-27 DIAGNOSIS — E1142 Type 2 diabetes mellitus with diabetic polyneuropathy: Secondary | ICD-10-CM | POA: Diagnosis not present

## 2022-03-29 DIAGNOSIS — H40013 Open angle with borderline findings, low risk, bilateral: Secondary | ICD-10-CM | POA: Diagnosis not present

## 2022-03-29 DIAGNOSIS — E119 Type 2 diabetes mellitus without complications: Secondary | ICD-10-CM | POA: Diagnosis not present

## 2022-03-29 DIAGNOSIS — Z961 Presence of intraocular lens: Secondary | ICD-10-CM | POA: Diagnosis not present

## 2022-04-25 ENCOUNTER — Other Ambulatory Visit: Payer: Self-pay | Admitting: Internal Medicine

## 2022-04-25 DIAGNOSIS — F32A Depression, unspecified: Secondary | ICD-10-CM

## 2022-04-26 ENCOUNTER — Other Ambulatory Visit: Payer: Self-pay

## 2022-04-26 DIAGNOSIS — E039 Hypothyroidism, unspecified: Secondary | ICD-10-CM

## 2022-04-27 ENCOUNTER — Other Ambulatory Visit: Payer: Self-pay

## 2022-04-27 DIAGNOSIS — E039 Hypothyroidism, unspecified: Secondary | ICD-10-CM

## 2022-04-27 MED ORDER — EUTHYROX 50 MCG PO TABS: 50.0000 ug | ORAL_TABLET | Freq: Every day | ORAL | 3 refills | Status: DC

## 2022-05-09 ENCOUNTER — Other Ambulatory Visit: Payer: Self-pay | Admitting: Internal Medicine

## 2022-05-09 DIAGNOSIS — Z1231 Encounter for screening mammogram for malignant neoplasm of breast: Secondary | ICD-10-CM

## 2022-06-12 ENCOUNTER — Telehealth: Payer: Self-pay | Admitting: *Deleted

## 2022-06-12 NOTE — Telephone Encounter (Signed)
Call from pt who stated she's out of sensors for her [place] 2. Stated she usaully get sensors from [facility] but she has not received any. She wants to know if her doctor can send a rx to [facility] [address]. Thanks

## 2022-06-13 ENCOUNTER — Other Ambulatory Visit: Payer: Self-pay | Admitting: Internal Medicine

## 2022-06-13 DIAGNOSIS — E1142 Type 2 diabetes mellitus with diabetic polyneuropathy: Secondary | ICD-10-CM

## 2022-06-13 DIAGNOSIS — E1121 Type 2 diabetes mellitus with diabetic nephropathy: Secondary | ICD-10-CM

## 2022-06-13 MED ORDER — FREESTYLE LIBRE 3 READER DEVI: 1.0000 | Freq: Once | 0 refills | Status: DC

## 2022-06-13 MED ORDER — FREESTYLE LIBRE 3 SENSOR MISC: 3 refills | Status: DC

## 2022-06-13 NOTE — Telephone Encounter (Signed)
Patient called she stated you sent in [facility] [place] 2 reader device she needs the [facility] [place] 3 reader device instead.

## 2022-06-13 NOTE — Telephone Encounter (Signed)
Patient said she already has the sensors to just needs the reader

## 2022-06-13 NOTE — Progress Notes (Addendum)
Message received that patient needs Freestyle Libre 2 Sensors sent to [facility] [address]. -Sensors sent to pharmacy  Addendum: Patient called in stating that she needed Freestyle libre 3 reader rather than 2.  -Freestyle 3 sensor and reader sent in.

## 2022-06-13 NOTE — Addendum Note (Signed)
Addended by: Edwyna Perfect on: 06/13/2022 04:48 PM   Modules accepted: Orders

## 2022-06-15 NOTE — Telephone Encounter (Signed)
Thanks Donna!

## 2022-06-15 NOTE — Telephone Encounter (Signed)
Call to pt: no answer. Left message for her to call triage nurse to clarify if she needs reader or sensors for the freestyle libre 2 or Freestyle libre 3. She needs the reader and sensors to match, therefore I advised her to be sure she looks at what she'd getting if she picks anything up from her pharmacy before she accepts it. Or to not pick anything up until her order is clarified.

## 2022-06-15 NOTE — Telephone Encounter (Signed)
Butch Penny can you f/u with this pt to see if she needs the sensor or reader?

## 2022-06-28 ENCOUNTER — Ambulatory Visit: Payer: Medicare Other

## 2022-07-04 ENCOUNTER — Other Ambulatory Visit: Payer: Self-pay | Admitting: Internal Medicine

## 2022-07-04 DIAGNOSIS — E785 Hyperlipidemia, unspecified: Secondary | ICD-10-CM

## 2022-07-12 ENCOUNTER — Other Ambulatory Visit: Payer: Self-pay | Admitting: Internal Medicine

## 2022-07-12 DIAGNOSIS — E1121 Type 2 diabetes mellitus with diabetic nephropathy: Secondary | ICD-10-CM

## 2022-07-21 ENCOUNTER — Other Ambulatory Visit: Payer: Self-pay | Admitting: Student

## 2022-07-21 DIAGNOSIS — F32A Depression, unspecified: Secondary | ICD-10-CM

## 2022-07-21 NOTE — Telephone Encounter (Signed)
Next appt scheduled 08/14/22 with Dr Elliot Gurney.

## 2022-07-26 ENCOUNTER — Other Ambulatory Visit: Payer: Self-pay | Admitting: Internal Medicine

## 2022-07-26 DIAGNOSIS — E119 Type 2 diabetes mellitus without complications: Secondary | ICD-10-CM

## 2022-08-14 ENCOUNTER — Encounter: Payer: 59 | Admitting: Student

## 2022-09-04 ENCOUNTER — Encounter: Payer: 59 | Admitting: Student

## 2022-09-20 ENCOUNTER — Other Ambulatory Visit: Payer: Self-pay | Admitting: Internal Medicine

## 2022-09-20 DIAGNOSIS — I10 Essential (primary) hypertension: Secondary | ICD-10-CM

## 2022-09-27 ENCOUNTER — Encounter: Payer: Self-pay | Admitting: *Deleted

## 2022-09-27 NOTE — Progress Notes (Signed)
[facility] Quality Team Note  Name: Jennifer Ingram Date of Birth: 09-27-1950 MRN: [id number] Date: 09/27/2022  [facility] Quality Team has reviewed this patient's chart, please see recommendations below:  AWV; Pt has open gap for AWV.  Looks like hx of no show. Colorectal Screening; Pt has open gap for colon screening.  Hx of deferring.   [facility] Quality Other; Pt has open gap for A1C.  Last one 01/2022.  None on file for 2024 yet.  Tried to call pt to arrange another AWV but had to leave a voice mail.

## 2022-09-28 ENCOUNTER — Other Ambulatory Visit: Payer: Self-pay

## 2022-09-28 DIAGNOSIS — E785 Hyperlipidemia, unspecified: Secondary | ICD-10-CM

## 2022-10-10 ENCOUNTER — Ambulatory Visit (INDEPENDENT_AMBULATORY_CARE_PROVIDER_SITE_OTHER): Payer: 59

## 2022-10-10 ENCOUNTER — Ambulatory Visit (INDEPENDENT_AMBULATORY_CARE_PROVIDER_SITE_OTHER): Payer: 59 | Admitting: Internal Medicine

## 2022-10-10 ENCOUNTER — Encounter: Payer: Self-pay | Admitting: Internal Medicine

## 2022-10-10 VITALS — BP 128/80 | HR 91 | Temp 97.7°F | Ht 59.0 in | Wt 147.7 lb

## 2022-10-10 VITALS — BP 123/80 | HR 91 | Temp 97.7°F | Ht 59.0 in | Wt 147.7 lb

## 2022-10-10 DIAGNOSIS — B351 Tinea unguium: Secondary | ICD-10-CM

## 2022-10-10 DIAGNOSIS — R011 Cardiac murmur, unspecified: Secondary | ICD-10-CM | POA: Diagnosis not present

## 2022-10-10 DIAGNOSIS — I129 Hypertensive chronic kidney disease with stage 1 through stage 4 chronic kidney disease, or unspecified chronic kidney disease: Secondary | ICD-10-CM

## 2022-10-10 DIAGNOSIS — R0989 Other specified symptoms and signs involving the circulatory and respiratory systems: Secondary | ICD-10-CM | POA: Diagnosis not present

## 2022-10-10 DIAGNOSIS — Z7984 Long term (current) use of oral hypoglycemic drugs: Secondary | ICD-10-CM

## 2022-10-10 DIAGNOSIS — Z0000 Encounter for general adult medical examination without abnormal findings: Secondary | ICD-10-CM

## 2022-10-10 DIAGNOSIS — M79674 Pain in right toe(s): Secondary | ICD-10-CM

## 2022-10-10 DIAGNOSIS — N1831 Chronic kidney disease, stage 3a: Secondary | ICD-10-CM

## 2022-10-10 DIAGNOSIS — Z794 Long term (current) use of insulin: Secondary | ICD-10-CM | POA: Diagnosis not present

## 2022-10-10 DIAGNOSIS — E1122 Type 2 diabetes mellitus with diabetic chronic kidney disease: Secondary | ICD-10-CM

## 2022-10-10 DIAGNOSIS — E1142 Type 2 diabetes mellitus with diabetic polyneuropathy: Secondary | ICD-10-CM | POA: Diagnosis not present

## 2022-10-10 DIAGNOSIS — E785 Hyperlipidemia, unspecified: Secondary | ICD-10-CM | POA: Diagnosis not present

## 2022-10-10 DIAGNOSIS — M79675 Pain in left toe(s): Secondary | ICD-10-CM | POA: Diagnosis not present

## 2022-10-10 DIAGNOSIS — E1121 Type 2 diabetes mellitus with diabetic nephropathy: Secondary | ICD-10-CM

## 2022-10-10 DIAGNOSIS — I10 Essential (primary) hypertension: Secondary | ICD-10-CM

## 2022-10-10 LAB — POCT GLYCOSYLATED HEMOGLOBIN (HGB A1C): Hemoglobin A1C: 7.1 % — AB (ref 4.0–5.6)

## 2022-10-10 LAB — GLUCOSE, CAPILLARY: Glucose-Capillary: 105 mg/dL — ABNORMAL HIGH (ref 70–99)

## 2022-10-10 MED ORDER — HUMULIN 70/30 KWIKPEN (70-30) 100 UNIT/ML ~~LOC~~ SUPN
PEN_INJECTOR | SUBCUTANEOUS | 11 refills | Status: DC
Start: 2022-10-10 — End: 2023-01-06

## 2022-10-10 NOTE — Progress Notes (Unsigned)
DM Meds: humulin 70/30 27 units BID, semaglutide 1 mg weekly,empagliflozin 10 mg qd P: Foot exam A1c ophtha   HTN

## 2022-10-10 NOTE — Progress Notes (Unsigned)
This is a Psychologist, occupational Note.  The care of the patient was discussed with Dr. Sloan Leiter and the assessment and plan was formulated with their assistance.  Please see their note for official documentation of the patient encounter.   Subjective:   Patient ID: Jennifer Ingram female   DOB: [birth year] 72 y.o.   MRN: [id number]  HPI: Jennifer Ingram is a 72 y.o. female with a PMH of DM2, HTN, HLD, hypothyroidism, and anxiety who presents for a follow up on her DM2 and HTN. She currently has a CGM and results were downloaded today. 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 She enjoys taking walks with her neighbors as well.  She relays that she is currently taking jardiance, humalin 70/30, semaglutide, paxil, euthryox, atenolol and rosuvastatin as prescribed and is tolerating them.   She estimates checking her blood sugar 3x a day, including in the morning fasting. She estimates her fasting glucose to be 110, 125 after lunch, 150 after dinner. She took her blood sugar this morning before eating a spoon of peanut butter for breakfast and it was 115. She denies polydipsia, polyphagia, or neuropathy. She endorses polyuria and states she wakes up 2x/night to urinate. She does not feel that this disrupts her sleep and she gets at least 7 hours of sleep a night.   Her bp is well controlled today, and she denies headaches.   For the details of today's visit, please refer to the assessment and plan.    Past Medical History:  Diagnosis Date   Arthritis    bilateral knees and back;   Depression    on meds, working well   Diabetes mellitus without complication (HCC) 05/31/2012   on meds   Heart palpitations    rapid   Hyperlipidemia    on meds   Hypertension    on meds   Current Outpatient Medications  Medication Sig Dispense Refill   Accu-Chek Softclix Lancets lancets Check blood sugar 3 times a day 100 each 11   atenolol (TENORMIN) 50 MG tablet Take 1 tablet by mouth twice daily  180 tablet 2   Continuous Blood Gluc Receiver (FREESTYLE LIBRE 3 READER) DEVI USE AS DIRECTED 1 each 0   Continuous Blood Gluc Sensor (FREESTYLE LIBRE 3 SENSOR) MISC Place 1 sensor on the skin every 14 days. Use to check glucose continuously. 6 each 3   empagliflozin (JARDIANCE) 10 MG TABS tablet TAKE 1 TABLET BY MOUTH ONCE DAILY BEFORE BREAKFAST 90 tablet 3   EUTHYROX 50 MCG tablet Take 1 tablet (50 mcg total) by mouth daily. 90 tablet 3   gabapentin (NEURONTIN) 300 MG capsule Take 1 capsule by mouth twice daily 180 capsule 3   glucose blood (ACCU-CHEK GUIDE) test strip Check blood sugar 3 times per day 100 each 12   insulin isophane & regular human KwikPen (HUMULIN 70/30 KWIKPEN) (70-30) 100 UNIT/ML KwikPen Inject 22 units 2 times daily 15 mL 11   Insulin Pen Needle (PEN NEEDLES) 31G X 5 MM MISC 1 each by Does not apply route daily. 90 each 3   PARoxetine (PAXIL) 40 MG tablet TAKE 1 TABLET BY MOUTH ONCE DAILY AFTER BREAKFAST 90 tablet 2   rosuvastatin (CRESTOR) 20 MG tablet Take 1 tablet by mouth once daily 90 tablet 0   Semaglutide, 1 MG/DOSE, 4 MG/3ML SOPN Inject 1 mg into the skin once a week. 3 mL 3   No current facility-administered medications for this visit.  Family History  Problem Relation Age of Onset   Cancer Mother        leukemia   Cancer Father        lung   Breast cancer Neg Hx    Colon polyps Neg Hx    Crohn's disease Neg Hx    Esophageal cancer Neg Hx    Stomach cancer Neg Hx    Rectal cancer Neg Hx    Social History   Socioeconomic History   Marital status: Single    Spouse name: Not on file   Number of children: 1   Years of education: 12   Highest education level: 12th grade  Occupational History   Not on file  Tobacco Use   Smoking status: Never   Smokeless tobacco: Never  Vaping Use   Vaping Use: Never used  Substance and Sexual Activity   Alcohol use: No   Drug use: No   Sexual activity: Yes  Other Topics Concern   Not on file  Social History  Narrative   Not on file   Social Determinants of Health   Financial Resource Strain: Not on file  Food Insecurity: No Food Insecurity (10/10/2022)   Hunger Vital Sign    Worried About Running Out of Food in the Last Year: Never true    Ran Out of Food in the Last Year: Never true  Transportation Needs: No Transportation Needs (10/10/2022)   PRAPARE - Administrator, Civil Service (Medical): No    Lack of Transportation (Non-Medical): No  Physical Activity: Sufficiently Active (10/10/2022)   Exercise Vital Sign    Days of Exercise per Week: 3 days    Minutes of Exercise per Session: 60 min  Stress: No Stress Concern Present (10/10/2022)   [employer] of Occupational Health - Occupational Stress Questionnaire    Feeling of Stress : Not at all  Social Connections: Moderately Integrated (10/10/2022)   Social Connection and Isolation Panel [NHANES]    Frequency of Communication with Friends and Family: More than three times a week    Frequency of Social Gatherings with Friends and Family: Once a week    Attends Religious Services: 1 to 4 times per year    Active Member of [employer] or Organizations: No    Attends Banker Meetings: 1 to 4 times per year    Marital Status: Widowed   Review of Systems: Pertinent items are noted in HPI. Objective:  Physical Exam: Vitals:   10/10/22 0939  BP: 123/80  Pulse: 91  Temp: 97.7 F (36.5 C)  TempSrc: Oral  SpO2: 98%  Weight: 147 lb 11.2 oz (67 kg)  Height: 4\' 11"  (1.499 m)    Constitutional: NAD, appears comfortable HEENT: Atraumatic, normocephalic. PERRL, anicteric sclera.  Neck: Supple, trachea midline.  Cardiovascular: RRR, 2/6 systolic murmur observed at aortic post. No rubs, or gallops.  Pulmonary/Chest: CTAB, no wheezes, rales, or rhonchi. No chest wall abnormalities.  Extremities: Warm and well perfused. Diminished peripheral pulses. 1+ DP bilaterally. No edema.  Psychiatric: Normal mood and  affect  Assessment & Plan:   Hypertension BP today is stable at 123/80 and is taking atenolol as prescribed. Will continue on atenolol and monitor.   Diabetes mellitus with polyneuropathy (HCC) A1C today is 7.1. Will continue on regimen of jardiance and semaglutide as prescribed. Will adjust humalin 70/30 to 22 units BID due to some low blood sugar readings from CGM data. Will get a BMP today to check kidney function.  On diabetic foot exam, some decreased sensation in 1st and 5th metatarsal.   Pain due to onychomycosis of toenails of both feet Patient has high risk of wound infection in feet due to diabetes and fungal infection on thickened toenails, which makes it difficult for her to trim her toenails. Will refer to podiatry.   Hyperlipidemia Patient to continue to take rosuvastatin.   Diminished pulses in lower extremity Diminished peripheral pulses observed on physical exam. DP 1+ bilaterally  Systolic murmur A: 2/6 systolic murmur observed at aortic post. Patient denies syncope, dizziness, leg swelling, orthopnea, or SOB.   P: [place] ordered to visualize aorta for possible stenosis.

## 2022-10-10 NOTE — Assessment & Plan Note (Signed)
Patient to continue to take rosuvastatin.

## 2022-10-10 NOTE — Assessment & Plan Note (Signed)
Diminished peripheral pulses observed on physical exam. DP 1+ bilaterally

## 2022-10-10 NOTE — Assessment & Plan Note (Signed)
A: 2/6 systolic murmur observed at aortic post. Patient denies syncope, dizziness, leg swelling, orthopnea, or SOB.   P: [place] ordered to visualize aorta for possible stenosis.

## 2022-10-10 NOTE — Progress Notes (Signed)
Subjective:   Jennifer Ingram is a 72 y.o. female who presents for an Initial Medicare Annual Wellness Visit. I connected with  Jennifer Ingram on 10/10/22 by a  Face-To-Face encounter  and verified that I am speaking with the correct person using two identifiers.  Patient Location: Other:  Office/Clinic  Provider Location: Office/Clinic  I discussed the limitations of evaluation and management by telemedicine. The patient expressed understanding and agreed to proceed.  Review of Systems    Defer to PCP       Objective:    Today's Vitals   10/10/22 1018  BP: 128/80  Pulse: 91  Temp: 97.7 F (36.5 C)  TempSrc: Oral  SpO2: 98%  Weight: 147 lb 11.2 oz (67 kg)  Height: 4\' 11"  (1.499 m)   Body mass index is 29.83 kg/m.     10/10/2022   10:19 AM 10/10/2022    9:43 AM 02/28/2022    2:15 PM 11/30/2021    2:38 PM 05/10/2021   11:08 AM 02/08/2021   10:03 AM 01/11/2021   11:54 AM  Advanced Directives  Does Patient Have a Medical Advance Directive? No No Yes No No No No  Type of Advance Directive   Living will      Would patient like information on creating a medical advance directive? No - Patient declined No - Patient declined  No - Patient declined No - Patient declined No - Patient declined No - Patient declined    Current Medications (verified) Outpatient Encounter Medications as of 10/10/2022  Medication Sig   Accu-Chek Softclix Lancets lancets Check blood sugar 3 times a day   atenolol (TENORMIN) 50 MG tablet Take 1 tablet by mouth twice daily   Continuous Blood Gluc Receiver (FREESTYLE LIBRE 3 READER) DEVI USE AS DIRECTED   Continuous Blood Gluc Sensor (FREESTYLE LIBRE 3 SENSOR) MISC Place 1 sensor on the skin every 14 days. Use to check glucose continuously.   empagliflozin (JARDIANCE) 10 MG TABS tablet TAKE 1 TABLET BY MOUTH ONCE DAILY BEFORE BREAKFAST   EUTHYROX 50 MCG tablet Take 1 tablet (50 mcg total) by mouth daily.   gabapentin (NEURONTIN) 300 MG capsule Take 1  capsule by mouth twice daily   glucose blood (ACCU-CHEK GUIDE) test strip Check blood sugar 3 times per day   Insulin Pen Needle (PEN NEEDLES) 31G X 5 MM MISC 1 each by Does not apply route daily.   PARoxetine (PAXIL) 40 MG tablet TAKE 1 TABLET BY MOUTH ONCE DAILY AFTER BREAKFAST   rosuvastatin (CRESTOR) 20 MG tablet Take 1 tablet by mouth once daily   Semaglutide, 1 MG/DOSE, 4 MG/3ML SOPN Inject 1 mg into the skin once a week.   [DISCONTINUED] EUTHYROX 50 MCG tablet Take 1 tablet (50 mcg total) by mouth daily.   [DISCONTINUED] insulin isophane & regular human KwikPen (HUMULIN 70/30 KWIKPEN) (70-30) 100 UNIT/ML KwikPen Inject 27 units 2 times daily   No facility-administered encounter medications on file as of 10/10/2022.    Allergies (verified) Patient has no known allergies.   History: Past Medical History:  Diagnosis Date   Arthritis    bilateral knees and back;   Depression    on meds, working well   Diabetes mellitus without complication (HCC) 05/31/2012   on meds   Heart palpitations    rapid   Hyperlipidemia    on meds   Hypertension    on meds   Past Surgical History:  Procedure Laterality Date   APPENDECTOMY  1968   CESAREAN SECTION  1980   CHOLECYSTECTOMY  1985   TONSILLECTOMY  1960   Family History  Problem Relation Age of Onset   Cancer Mother        leukemia   Cancer Father        lung   Breast cancer Neg Hx    Colon polyps Neg Hx    Crohn's disease Neg Hx    Esophageal cancer Neg Hx    Stomach cancer Neg Hx    Rectal cancer Neg Hx    Social History   Socioeconomic History   Marital status: Single    Spouse name: Not on file   Number of children: 1   Years of education: 12   Highest education level: 12th grade  Occupational History   Not on file  Tobacco Use   Smoking status: Never   Smokeless tobacco: Never  Vaping Use   Vaping Use: Never used  Substance and Sexual Activity   Alcohol use: No   Drug use: No   Sexual activity: Yes   Other Topics Concern   Not on file  Social History Narrative   Not on file   Social Determinants of Health   Financial Resource Strain: Not on file  Food Insecurity: No Food Insecurity (10/10/2022)   Hunger Vital Sign    Worried About Running Out of Food in the Last Year: Never true    Ran Out of Food in the Last Year: Never true  Transportation Needs: No Transportation Needs (10/10/2022)   PRAPARE - Administrator, Civil Service (Medical): No    Lack of Transportation (Non-Medical): No  Physical Activity: Sufficiently Active (10/10/2022)   Exercise Vital Sign    Days of Exercise per Week: 3 days    Minutes of Exercise per Session: 60 min  Stress: No Stress Concern Present (10/10/2022)   [employer] of Occupational Health - Occupational Stress Questionnaire    Feeling of Stress : Not at all  Social Connections: Moderately Integrated (10/10/2022)   Social Connection and Isolation Panel [NHANES]    Frequency of Communication with Friends and Family: More than three times a week    Frequency of Social Gatherings with Friends and Family: Once a week    Attends Religious Services: 1 to 4 times per year    Active Member of [employer] or Organizations: No    Attends Banker Meetings: 1 to 4 times per year    Marital Status: Widowed    Tobacco Counseling Counseling given: Not Answered   Clinical Intake:  Pre-visit preparation completed: Yes  Pain : No/denies pain     Nutritional Risks: None Diabetes: Yes CBG done?: Yes CBG resulted in Enter/ Edit results?: Yes Did pt. bring in CBG monitor from home?: Yes Glucose Meter Downloaded?: Yes  How often do you need to have someone help you when you read instructions, pamphlets, or other written materials from your doctor or pharmacy?: 1 - Never What is the last grade level you completed in school?: 12th grade  Diabetic?Nutrition Risk Assessment:  Has the patient had any N/V/D within the last 2 months?   No  Does the patient have any non-healing wounds?  No  Has the patient had any unintentional weight loss or weight gain?  No   Diabetes:  Is the patient diabetic?  Yes  If diabetic, was a CBG obtained today?  Yes  Did the patient bring in their glucometer from home?  Yes  Financial Strains and Diabetes Management:  Are you having any financial strains with the device, your supplies or your medication? No .  Does the patient want to be seen by Chronic Care Management for management of their diabetes?  No  Would the patient like to be referred to a Nutritionist or for Diabetic Management?  No   Diabetic Exams:  Diabetic Eye Exam: Overdue for diabetic eye exam. Pt has been advised about the importance in completing this exam. Patient advised to call and schedule an eye exam. Diabetic Foot Exam: Overdue, Pt has been advised about the importance in completing this exam. Pt is scheduled for diabetic foot exam on N/A.   Interpreter Needed?: No  Information entered by :: Kalena Mander,cma   Activities of Daily Living    10/10/2022   10:20 AM 10/10/2022    9:43 AM  In your present state of health, do you have any difficulty performing the following activities:  Hearing? 0 0  Vision? 0 0  Difficulty concentrating or making decisions? 0 0  Walking or climbing stairs? 0 0  Dressing or bathing? 0 0  Doing errands, shopping? 0 0    Patient Care Team: Masters, Florentina Addison, DO as PCP - General (Internal Medicine)  Indicate any recent Medical Services you may have received from other than [facility] providers in the past year (date may be approximate).     Assessment:   This is a routine wellness examination for [place].  Hearing/Vision screen No results found.  Dietary issues and exercise activities discussed:     Goals Addressed   None   Depression Screen    10/10/2022   10:19 AM 10/10/2022    9:43 AM 02/28/2022    2:15 PM 11/30/2021    2:39 PM 05/10/2021    9:53 AM 02/08/2021   10:03  AM 01/04/2021    9:26 AM  PHQ 2/9 Scores  PHQ - 2 Score 0 0 0 0 0 0 0  PHQ- 9 Score      0     Fall Risk    10/10/2022   10:19 AM 10/10/2022    9:43 AM 02/28/2022    2:15 PM 11/30/2021    2:37 PM 05/10/2021    9:52 AM  Fall Risk   Falls in the past year? 0 0 0 0 0  Number falls in past yr: 0 0 0 0 0  Injury with Fall? 0 0 0 0 0  Risk for fall due to : No Fall Risks No Fall Risks No Fall Risks No Fall Risks No Fall Risks  Follow up Falls evaluation completed;Falls prevention discussed Falls prevention discussed;Falls evaluation completed Falls evaluation completed;Falls prevention discussed Falls evaluation completed;Falls prevention discussed Falls evaluation completed    FALL RISK PREVENTION PERTAINING TO THE HOME:  Any stairs in or around the home? No  If so, are there any without handrails? No  Home free of loose throw rugs in walkways, pet beds, electrical cords, etc? Yes  Adequate lighting in your home to reduce risk of falls? Yes   ASSISTIVE DEVICES UTILIZED TO PREVENT FALLS:  Life alert? No  Use of a cane, walker or w/c? No  Grab bars in the bathroom? No  Shower chair or bench in shower? No  Elevated toilet seat or a handicapped toilet? No   TIMED UP AND GO:  Was the test performed? No .  Length of time to ambulate 10 feet: 0 sec.   Gait slow and steady without use of  assistive device  Cognitive Function:        10/10/2022   10:20 AM  6CIT Screen  What Year? 0 points  What month? 0 points  What time? 0 points  Count back from 20 0 points  Months in reverse 0 points  Repeat phrase 0 points  Total Score 0 points    Immunizations Immunization History  Administered Date(s) Administered   Tdap 01/22/2008    TDAP status: Due, Education has been provided regarding the importance of this vaccine. Advised may receive this vaccine at local pharmacy or Health Dept. Aware to provide a copy of the vaccination record if obtained from local pharmacy or Health Dept.  Verbalized acceptance and understanding.  Flu Vaccine status: Up to date  Pneumococcal vaccine status: Due, Education has been provided regarding the importance of this vaccine. Advised may receive this vaccine at local pharmacy or Health Dept. Aware to provide a copy of the vaccination record if obtained from local pharmacy or Health Dept. Verbalized acceptance and understanding.  Covid-19 vaccine status: Information provided on how to obtain vaccines.   Qualifies for Shingles Vaccine? No   Zostavax completed No   Shingrix Completed?: No.    Education has been provided regarding the importance of this vaccine. Patient has been advised to call insurance company to determine out of pocket expense if they have not yet received this vaccine. Advised may also receive vaccine at local pharmacy or Health Dept. Verbalized acceptance and understanding.  Screening Tests Health Maintenance  Topic Date Due   COVID-19 Vaccine (1) Never done   OPHTHALMOLOGY EXAM  Never done   Hepatitis C Screening  Never done   Colonoscopy  Never done   Zoster Vaccines- Shingrix (1 of 2) Never done   Pneumonia Vaccine 24+ Years old (1 of 1 - PCV) Never done   DTaP/Tdap/Td (2 - Td or Tdap) 01/21/2018   FOOT EXAM  02/14/2022   INFLUENZA VACCINE  11/30/2022   HEMOGLOBIN A1C  01/10/2023   Diabetic kidney evaluation - eGFR measurement  03/01/2023   Diabetic kidney evaluation - Urine ACR  03/01/2023   MAMMOGRAM  03/02/2023   Medicare Annual Wellness (AWV)  10/10/2023   DEXA SCAN  Completed   HPV VACCINES  Aged Out    Health Maintenance  Health Maintenance Due  Topic Date Due   COVID-19 Vaccine (1) Never done   OPHTHALMOLOGY EXAM  Never done   Hepatitis C Screening  Never done   Colonoscopy  Never done   Zoster Vaccines- Shingrix (1 of 2) Never done   Pneumonia Vaccine 70+ Years old (1 of 1 - PCV) Never done   DTaP/Tdap/Td (2 - Td or Tdap) 01/21/2018   FOOT EXAM  02/14/2022      Mammogram status: Ordered  05/09/2022. Pt provided with contact info and advised to call to schedule appt.    Lung Cancer Screening: (Low Dose CT Chest recommended if Age 2-80 years, 30 pack-year currently smoking OR have quit w/in 15years.) does not qualify.   Lung Cancer Screening Referral: N/A  Additional Screening:  Hepatitis C Screening: does not qualify; Completed N/A  Vision Screening: Recommended annual ophthalmology exams for early detection of glaucoma and other disorders of the eye. Is the patient up to date with their annual eye exam?  No  Who is the provider or what is the name of the office in which the patient attends annual eye exams? N/A If pt is not established with a provider, would they like to  be referred to a provider to establish care? Yes .   Dental Screening: Recommended annual dental exams for proper oral hygiene  Community Resource Referral / Chronic Care Management: CRR required this visit?  No   CCM required this visit?  No      Plan:     I have personally reviewed and noted the following in the patient's chart:   Medical and social history Use of alcohol, tobacco or illicit drugs  Current medications and supplements including opioid prescriptions. Patient is not currently taking opioid prescriptions. Functional ability and status Nutritional status Physical activity Advanced directives List of other physicians Hospitalizations, surgeries, and ER visits in previous 12 months Vitals Screenings to include cognitive, depression, and falls Referrals and appointments  In addition, I have reviewed and discussed with patient certain preventive protocols, quality metrics, and best practice recommendations. A written personalized care plan for preventive services as well as general preventive health recommendations were provided to patient.     Cala Bradford, [facility]   10/10/2022   Nurse Notes: Face-To-Face Visit  Ms. Jurkowski , Thank you for taking time to come for your  Medicare Wellness Visit. I appreciate your ongoing commitment to your health goals. Please review the following plan we discussed and let me know if I can assist you in the future.   These are the goals we discussed:  Goals   None     This is a list of the screening recommended for you and due dates:  Health Maintenance  Topic Date Due   COVID-19 Vaccine (1) Never done   Eye exam for diabetics  Never done   Hepatitis C Screening  Never done   Colon Cancer Screening  Never done   Zoster (Shingles) Vaccine (1 of 2) Never done   Pneumonia Vaccine (1 of 1 - PCV) Never done   DTaP/Tdap/Td vaccine (2 - Td or Tdap) 01/21/2018   Complete foot exam   02/14/2022   Flu Shot  11/30/2022   Hemoglobin A1C  01/10/2023   Yearly kidney function blood test for diabetes  03/01/2023   Yearly kidney health urinalysis for diabetes  03/01/2023   Mammogram  03/02/2023   Medicare Annual Wellness Visit  10/10/2023   DEXA scan (bone density measurement)  Completed   HPV Vaccine  Aged Out

## 2022-10-10 NOTE — Assessment & Plan Note (Signed)
BP today is stable at 123/80 and is taking atenolol as prescribed. Will continue on atenolol and monitor.

## 2022-10-10 NOTE — Assessment & Plan Note (Addendum)
On review of CGM data, she is having hypoglycemia into 50s several times weekly. Episodes are not at consistent times occurring around 12AM, 12PM, and 3PM. A1C today is 7.1, down from 8.2. Will continue on regimen of jardiance and semaglutide as prescribed. Will adjust humalin 70/30 to 22 units BID. Will get a BMP today to check kidney function. On diabetic foot exam, some decreased sensation in 1st and 5th metatarsal.

## 2022-10-10 NOTE — Assessment & Plan Note (Addendum)
Patient has high risk of wound infection in feet due to diabetes and fungal infection on thickened toenails, which makes it difficult for her to trim her toenails. Will refer to podiatry.

## 2022-10-10 NOTE — Patient Instructions (Addendum)
Thank you, [name] for allowing [place] to provide your care today.  Diabetes Your Hgb A1c was better at 7.1! Good work. I am decreasing insulin from 27 units to 22 units as you are having low blood sugars. Please follow-up in 4 weeks to review blood sugars readings. Be sure that you are eating consistently if you take insulin. Please go back to eye doctor for yearly screening with diabetes.  Heart murmur I have ordered an ultrasound of your heart to further evaluate. It is reassuring that you aren't having symptoms but would like to understand why the sound has changed.  Kidney disease I will call about blood work in a few days.  I have ordered the following labs for you:   Lab Orders         Glucose, capillary         BMP8+Anion Gap         POC Hbg A1C       Referrals ordered today:    Referral Orders         Ambulatory referral to Ophthalmology      I have ordered the following medication/changed the following medications:   Stop the following medications: Medications Discontinued During This Encounter  Medication Reason   nystatin (MYCOSTATIN/NYSTOP) powder    EUTHYROX 50 MCG tablet    insulin isophane & regular human KwikPen (HUMULIN 70/30 KWIKPEN) (70-30) 100 UNIT/ML KwikPen Reorder     Start the following medications: Meds ordered this encounter  Medications   insulin isophane & regular human KwikPen (HUMULIN 70/30 KWIKPEN) (70-30) 100 UNIT/ML KwikPen    Sig: Inject 22 units 2 times daily    Dispense:  15 mL    Refill:  11     Follow up: 1 month   We look forward to seeing you next time. Please call our clinic at [phone] if you have any questions or concerns. The best time to call is Monday-Friday from 9am-4pm, but there is someone available 24/7. If after hours or the weekend, call the main hospital number and ask for the Internal Medicine Resident On-Call. If you need medication refills, please notify your pharmacy one week in advance and they will  send [place] a request.   Thank you for trusting me with your care. Wishing you the best!   Rudene Christians, DO [facility]

## 2022-10-11 ENCOUNTER — Other Ambulatory Visit: Payer: Self-pay | Admitting: Internal Medicine

## 2022-10-11 DIAGNOSIS — E119 Type 2 diabetes mellitus without complications: Secondary | ICD-10-CM

## 2022-10-11 LAB — BMP8+ANION GAP
Anion Gap: 19 mmol/L — ABNORMAL HIGH (ref 10.0–18.0)
BUN/Creatinine Ratio: 20 (ref 12–28)
BUN: 31 mg/dL — ABNORMAL HIGH (ref 8–27)
CO2: 20 mmol/L (ref 20–29)
Calcium: 9.3 mg/dL (ref 8.7–10.3)
Chloride: 103 mmol/L (ref 96–106)
Creatinine, Ser: 1.58 mg/dL — ABNORMAL HIGH (ref 0.57–1.00)
Glucose: 104 mg/dL — ABNORMAL HIGH (ref 70–99)
Potassium: 4.4 mmol/L (ref 3.5–5.2)
Sodium: 142 mmol/L (ref 134–144)
eGFR: 35 mL/min/{1.73_m2} — ABNORMAL LOW (ref >59)

## 2022-10-11 NOTE — Assessment & Plan Note (Addendum)
History of CKD stage 3a. Last Gfr 7 months ago at 24.  P: Repeat BMP showed worsening of GFR to 35. Last microalbumin/creatinine ratio 10/23 was 10. She has not started new medications recently. I do not have a clear explanation for why GFR decreased by 20% in 6 months as diabetes and htn are under improved control. Will call patient to have come back next week to repeat BMP.  -repeat BMP for future visit next week -UA

## 2022-10-11 NOTE — Progress Notes (Deleted)
This is a Psychologist, occupational Note.  The care of the patient was discussed with Dr. Sloan Leiter and the assessment and plan was formulated with their assistance.  Please see their note for official documentation of the patient encounter.   Subjective:   Patient ID: Jennifer Ingram female   DOB: [birth year] 72 y.o.   MRN: [id number]  HPI: [name] is a 72 y.o. female with a PMH of DM2, HTN, HLD, hypothyroidism, and anxiety who presents for a follow up on her DM2 and HTN. She currently has a CGM and results were downloaded today. 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 She enjoys taking walks with her neighbors as well.  She relays that she is currently taking jardiance, humalin 70/30, semaglutide, paxil, euthryox, atenolol and rosuvastatin as prescribed and is tolerating them.   She estimates checking her blood sugar 3x a day, including in the morning fasting. She estimates her fasting glucose to be 110, 125 after lunch, 150 after dinner. She took her blood sugar this morning before eating a spoon of peanut butter for breakfast and it was 115. She denies polydipsia, polyphagia, or neuropathy. She endorses polyuria and states she wakes up 2x/night to urinate. She does not feel that this disrupts her sleep and she gets at least 7 hours of sleep a night.   Her bp is well controlled today, and she denies headaches.   For the details of today's visit, please refer to the assessment and plan.    Past Medical History:  Diagnosis Date   Arthritis    bilateral knees and back;   Depression    on meds, working well   Diabetes mellitus without complication (HCC) 05/31/2012   on meds   Heart palpitations    rapid   Hyperlipidemia    on meds   Hypertension    on meds   Current Outpatient Medications  Medication Sig Dispense Refill   Accu-Chek Softclix Lancets lancets Check blood sugar 3 times a day 100 each 11   atenolol (TENORMIN) 50 MG tablet Take 1 tablet by mouth twice daily  180 tablet 2   Continuous Blood Gluc Receiver (FREESTYLE LIBRE 3 READER) DEVI USE AS DIRECTED 1 each 0   Continuous Blood Gluc Sensor (FREESTYLE LIBRE 3 SENSOR) MISC Place 1 sensor on the skin every 14 days. Use to check glucose continuously. 6 each 3   empagliflozin (JARDIANCE) 10 MG TABS tablet TAKE 1 TABLET BY MOUTH ONCE DAILY BEFORE BREAKFAST 90 tablet 3   EUTHYROX 50 MCG tablet Take 1 tablet (50 mcg total) by mouth daily. 90 tablet 3   gabapentin (NEURONTIN) 300 MG capsule Take 1 capsule by mouth twice daily 180 capsule 3   glucose blood (ACCU-CHEK GUIDE) test strip Check blood sugar 3 times per day 100 each 12   insulin isophane & regular human KwikPen (HUMULIN 70/30 KWIKPEN) (70-30) 100 UNIT/ML KwikPen Inject 22 units 2 times daily 15 mL 11   Insulin Pen Needle (PEN NEEDLES) 31G X 5 MM MISC 1 each by Does not apply route daily. 90 each 3   PARoxetine (PAXIL) 40 MG tablet TAKE 1 TABLET BY MOUTH ONCE DAILY AFTER BREAKFAST 90 tablet 2   rosuvastatin (CRESTOR) 20 MG tablet Take 1 tablet by mouth once daily 90 tablet 0   Semaglutide, 1 MG/DOSE, 4 MG/3ML SOPN Inject 1 mg into the skin once a week. 3 mL 3   No current facility-administered medications for this visit.  Family History  Problem Relation Age of Onset   Cancer Mother        leukemia   Cancer Father        lung   Breast cancer Neg Hx    Colon polyps Neg Hx    Crohn's disease Neg Hx    Esophageal cancer Neg Hx    Stomach cancer Neg Hx    Rectal cancer Neg Hx    Social History   Socioeconomic History   Marital status: Single    Spouse name: Not on file   Number of children: 1   Years of education: 12   Highest education level: 12th grade  Occupational History   Not on file  Tobacco Use   Smoking status: Never   Smokeless tobacco: Never  Vaping Use   Vaping Use: Never used  Substance and Sexual Activity   Alcohol use: No   Drug use: No   Sexual activity: Yes  Other Topics Concern   Not on file  Social History  Narrative   Not on file   Social Determinants of Health   Financial Resource Strain: Not on file  Food Insecurity: No Food Insecurity (10/10/2022)   Hunger Vital Sign    Worried About Running Out of Food in the Last Year: Never true    Ran Out of Food in the Last Year: Never true  Transportation Needs: No Transportation Needs (10/10/2022)   PRAPARE - Administrator, Civil Service (Medical): No    Lack of Transportation (Non-Medical): No  Physical Activity: Sufficiently Active (10/10/2022)   Exercise Vital Sign    Days of Exercise per Week: 3 days    Minutes of Exercise per Session: 60 min  Stress: No Stress Concern Present (10/10/2022)   [employer] of Occupational Health - Occupational Stress Questionnaire    Feeling of Stress : Not at all  Social Connections: Moderately Integrated (10/10/2022)   Social Connection and Isolation Panel [NHANES]    Frequency of Communication with Friends and Family: More than three times a week    Frequency of Social Gatherings with Friends and Family: Once a week    Attends Religious Services: 1 to 4 times per year    Active Member of [employer] or Organizations: No    Attends Banker Meetings: 1 to 4 times per year    Marital Status: Widowed   Review of Systems: Pertinent items are noted in HPI. Objective:  Physical Exam: Vitals:   10/10/22 0939  BP: 123/80  Pulse: 91  Temp: 97.7 F (36.5 C)  TempSrc: Oral  SpO2: 98%  Weight: 147 lb 11.2 oz (67 kg)  Height: 4\' 11"  (1.499 m)    Constitutional: NAD, appears comfortable HEENT: Atraumatic, normocephalic. PERRL, anicteric sclera.  Neck: Supple, trachea midline.  Cardiovascular: RRR, 2/6 systolic murmur observed at aortic post. No rubs, or gallops.  Pulmonary/Chest: CTAB, no wheezes, rales, or rhonchi. No chest wall abnormalities.  Extremities: Warm and well perfused. Diminished peripheral pulses. 1+ DP bilaterally. No edema.  Psychiatric: Normal mood and  affect  Assessment & Plan:   Hypertension BP today is stable at 123/80 and is taking atenolol as prescribed. Will continue on atenolol and monitor.   Diabetes mellitus with polyneuropathy (HCC) On review of CGM data, she is having hypoglycemia into 50s several times weekly. Episodes are not at consistent times occurring around 12AM, 12PM, and 3PM. A1C today is 7.1, down from 8.2. Will continue on regimen of jardiance and semaglutide  as prescribed. Will adjust humalin 70/30 to 22 units BID. Will get a BMP today to check kidney function. On diabetic foot exam, some decreased sensation in 1st and 5th metatarsal.  Pain due to onychomycosis of toenails of both feet Patient has high risk of wound infection in feet due to diabetes and fungal infection on thickened toenails, which makes it difficult for her to trim her toenails. Will refer to podiatry.   Hyperlipidemia Patient to continue to take rosuvastatin.   Diminished pulses in lower extremity Diminished peripheral pulses observed on physical exam. DP 1+ bilaterally  Systolic murmur A: 2/6 systolic murmur observed at aortic post. Patient denies syncope, dizziness, leg swelling, orthopnea, or SOB.   P: [place] ordered to visualize aorta for possible stenosis.    Chronic kidney disease History of CKD stage 3a. Last Gfr 7 months ago at 26.  P: Repeat BMP showed worsening of GFR to 35. Last microalbumin/creatinine ratio 10/23 was 10. She has not started new medications recently. I do not have a clear explanation for why GFR decreased by 20% in 6 months as diabetes and htn are under improved control. Will call patient to have come back next week to repeat BMP.  -repeat BMP for future visit next week -UA

## 2022-10-11 NOTE — Assessment & Plan Note (Signed)
>>  ASSESSMENT AND PLAN FOR CHRONIC KIDNEY DISEASE WRITTEN ON 10/11/2022  6:41 AM BY Abrham Maslowski, DO  History of CKD stage 3a. Last Gfr 7 months ago at 19.  P: Repeat BMP showed worsening of GFR to 35. Last microalbumin/creatinine ratio 10/23 was 10. She has not started new medications recently. I do not have a clear explanation for why GFR decreased by 20% in 6 months as diabetes and htn are under improved control. Will call patient to have come back next week to repeat BMP.  -repeat BMP for future visit next week -UA

## 2022-10-12 ENCOUNTER — Telehealth: Payer: Self-pay

## 2022-10-12 NOTE — Telephone Encounter (Signed)
Patient called she is requesting a call back regarding her lab results. Please return patients call.

## 2022-10-14 NOTE — Progress Notes (Signed)
Internal Medicine Clinic Attending  Case discussed with the resident at the time of the visit.  We reviewed the resident's history and exam and pertinent patient test results.  I agree with the assessment, diagnosis, and plan of care documented in the resident's note.  

## 2022-10-16 ENCOUNTER — Encounter: Payer: Self-pay | Admitting: Internal Medicine

## 2022-10-19 NOTE — Addendum Note (Signed)
Addended by: Lucille Passy on: 10/19/2022 08:34 AM   Modules accepted: Orders

## 2022-10-25 ENCOUNTER — Other Ambulatory Visit: Payer: 59

## 2022-10-26 NOTE — Progress Notes (Signed)
Internal Medicine Clinic Attending  I reviewed the AWV findings.  I agree with the assessment, diagnosis, and plan of care documented in the AWV note.     

## 2022-11-13 ENCOUNTER — Ambulatory Visit (HOSPITAL_COMMUNITY): Payer: 59

## 2022-11-22 ENCOUNTER — Ambulatory Visit (HOSPITAL_COMMUNITY): Payer: 59

## 2022-11-29 ENCOUNTER — Ambulatory Visit: Payer: 59

## 2022-12-04 ENCOUNTER — Other Ambulatory Visit: Payer: Self-pay

## 2022-12-05 MED ORDER — PEN NEEDLES 31G X 5 MM MISC: 1.0000 | Freq: Every day | 3 refills | Status: AC

## 2022-12-06 ENCOUNTER — Ambulatory Visit: Payer: 59

## 2022-12-24 ENCOUNTER — Other Ambulatory Visit: Payer: Self-pay | Admitting: Internal Medicine

## 2022-12-24 DIAGNOSIS — E785 Hyperlipidemia, unspecified: Secondary | ICD-10-CM

## 2022-12-28 ENCOUNTER — Encounter (HOSPITAL_COMMUNITY): Payer: Self-pay

## 2022-12-28 ENCOUNTER — Emergency Department (HOSPITAL_COMMUNITY): Payer: 59

## 2022-12-28 ENCOUNTER — Emergency Department (HOSPITAL_COMMUNITY)
Admission: EM | Admit: 2022-12-28 | Discharge: 2022-12-28 | Disposition: A | Discharge status: Home / Self Care | Payer: 59 | Attending: Emergency Medicine | Admitting: Emergency Medicine

## 2022-12-28 ENCOUNTER — Other Ambulatory Visit: Payer: Self-pay

## 2022-12-28 DIAGNOSIS — D72829 Elevated white blood cell count, unspecified: Secondary | ICD-10-CM | POA: Diagnosis not present

## 2022-12-28 DIAGNOSIS — K429 Umbilical hernia without obstruction or gangrene: Secondary | ICD-10-CM | POA: Diagnosis not present

## 2022-12-28 DIAGNOSIS — K449 Diaphragmatic hernia without obstruction or gangrene: Secondary | ICD-10-CM | POA: Diagnosis not present

## 2022-12-28 DIAGNOSIS — R112 Nausea with vomiting, unspecified: Secondary | ICD-10-CM | POA: Insufficient documentation

## 2022-12-28 DIAGNOSIS — A419 Sepsis, unspecified organism: Secondary | ICD-10-CM | POA: Diagnosis not present

## 2022-12-28 DIAGNOSIS — R739 Hyperglycemia, unspecified: Secondary | ICD-10-CM

## 2022-12-28 DIAGNOSIS — Z1152 Encounter for screening for COVID-19: Secondary | ICD-10-CM | POA: Diagnosis not present

## 2022-12-28 DIAGNOSIS — R079 Chest pain, unspecified: Secondary | ICD-10-CM | POA: Diagnosis not present

## 2022-12-28 DIAGNOSIS — T679XXA Effect of heat and light, unspecified, initial encounter: Secondary | ICD-10-CM

## 2022-12-28 DIAGNOSIS — R42 Dizziness and giddiness: Secondary | ICD-10-CM | POA: Diagnosis not present

## 2022-12-28 DIAGNOSIS — I959 Hypotension, unspecified: Secondary | ICD-10-CM | POA: Diagnosis not present

## 2022-12-28 DIAGNOSIS — Z79899 Other long term (current) drug therapy: Secondary | ICD-10-CM | POA: Diagnosis not present

## 2022-12-28 DIAGNOSIS — E1165 Type 2 diabetes mellitus with hyperglycemia: Secondary | ICD-10-CM | POA: Diagnosis not present

## 2022-12-28 DIAGNOSIS — I10 Essential (primary) hypertension: Secondary | ICD-10-CM | POA: Diagnosis not present

## 2022-12-28 DIAGNOSIS — R630 Anorexia: Secondary | ICD-10-CM | POA: Insufficient documentation

## 2022-12-28 DIAGNOSIS — Z794 Long term (current) use of insulin: Secondary | ICD-10-CM | POA: Diagnosis not present

## 2022-12-28 DIAGNOSIS — R1084 Generalized abdominal pain: Secondary | ICD-10-CM | POA: Diagnosis not present

## 2022-12-28 DIAGNOSIS — Z7984 Long term (current) use of oral hypoglycemic drugs: Secondary | ICD-10-CM | POA: Insufficient documentation

## 2022-12-28 DIAGNOSIS — R933 Abnormal findings on diagnostic imaging of other parts of digestive tract: Secondary | ICD-10-CM | POA: Diagnosis not present

## 2022-12-28 DIAGNOSIS — R1031 Right lower quadrant pain: Secondary | ICD-10-CM | POA: Insufficient documentation

## 2022-12-28 LAB — CBC WITH DIFFERENTIAL/PLATELET
Abs Immature Granulocytes: 0.06 10*3/uL (ref 0.00–0.07)
Basophils Absolute: 0 10*3/uL (ref 0.0–0.1)
Basophils Relative: 0 %
Eosinophils Absolute: 0.2 10*3/uL (ref 0.0–0.5)
Eosinophils Relative: 1 %
HCT: 46.2 % — ABNORMAL HIGH (ref 36.0–46.0)
Hemoglobin: 15.3 g/dL — ABNORMAL HIGH (ref 12.0–15.0)
Immature Granulocytes: 0 %
Lymphocytes Relative: 14 %
Lymphs Abs: 1.9 10*3/uL (ref 0.7–4.0)
MCH: 30.8 pg (ref 26.0–34.0)
MCHC: 33.1 g/dL (ref 30.0–36.0)
MCV: 93.1 fL (ref 80.0–100.0)
Monocytes Absolute: 0.7 10*3/uL (ref 0.1–1.0)
Monocytes Relative: 5 %
Neutro Abs: 10.7 10*3/uL — ABNORMAL HIGH (ref 1.7–7.7)
Neutrophils Relative %: 80 %
Platelets: 234 10*3/uL (ref 150–400)
RBC: 4.96 MIL/uL (ref 3.87–5.11)
RDW: 12.2 % (ref 11.5–15.5)
WBC: 13.6 10*3/uL — ABNORMAL HIGH (ref 4.0–10.5)
nRBC: 0 % (ref 0.0–0.2)

## 2022-12-28 LAB — COMPREHENSIVE METABOLIC PANEL
ALT: 15 U/L (ref 0–44)
AST: 22 U/L (ref 15–41)
Albumin: 3.9 g/dL (ref 3.5–5.0)
Alkaline Phosphatase: 78 U/L (ref 38–126)
Anion gap: 15 (ref 5–15)
BUN: 27 mg/dL — ABNORMAL HIGH (ref 8–23)
CO2: 22 mmol/L (ref 22–32)
Calcium: 8.8 mg/dL — ABNORMAL LOW (ref 8.9–10.3)
Chloride: 107 mmol/L (ref 98–111)
Creatinine, Ser: 1.18 mg/dL — ABNORMAL HIGH (ref 0.44–1.00)
GFR, Estimated: 50 mL/min — ABNORMAL LOW (ref >60)
Glucose, Bld: 301 mg/dL — ABNORMAL HIGH (ref 70–99)
Potassium: 3.8 mmol/L (ref 3.5–5.1)
Sodium: 144 mmol/L (ref 135–145)
Total Bilirubin: 0.6 mg/dL (ref 0.3–1.2)
Total Protein: 7.6 g/dL (ref 6.5–8.1)

## 2022-12-28 LAB — URINALYSIS, ROUTINE W REFLEX MICROSCOPIC
Bacteria, UA: NONE SEEN
Bilirubin Urine: NEGATIVE
Glucose, UA: >=500 mg/dL — AB
Hgb urine dipstick: NEGATIVE
Ketones, ur: 20 mg/dL — AB
Leukocytes,Ua: NEGATIVE
Nitrite: NEGATIVE
Protein, ur: 30 mg/dL — AB
Specific Gravity, Urine: 1.026 (ref 1.005–1.030)
pH: 6 (ref 5.0–8.0)

## 2022-12-28 LAB — TROPONIN I (HIGH SENSITIVITY)
Troponin I (High Sensitivity): 3 ng/L (ref <18)
Troponin I (High Sensitivity): 4 ng/L (ref <18)

## 2022-12-28 LAB — CBG MONITORING, ED: Glucose-Capillary: 315 mg/dL — ABNORMAL HIGH (ref 70–99)

## 2022-12-28 LAB — RESP PANEL BY RT-PCR (RSV, FLU A&B, COVID)  RVPGX2
Influenza A by PCR: NEGATIVE
Influenza B by PCR: NEGATIVE
Resp Syncytial Virus by PCR: NEGATIVE
SARS Coronavirus 2 by RT PCR: NEGATIVE

## 2022-12-28 LAB — LIPASE, BLOOD: Lipase: 29 U/L (ref 11–51)

## 2022-12-28 MED ORDER — IOHEXOL 300 MG/ML  SOLN
80.0000 mL | Freq: Once | INTRAMUSCULAR | Status: AC | PRN
  Administered 2022-12-28: 80 mL via INTRAVENOUS

## 2022-12-28 MED ORDER — ONDANSETRON HCL 4 MG PO TABS: 4.0000 mg | ORAL_TABLET | Freq: Four times a day (QID) | ORAL | 0 refills | Status: DC

## 2022-12-28 MED ORDER — ONDANSETRON HCL 4 MG/2ML IJ SOLN
4.0000 mg | Freq: Once | INTRAMUSCULAR | Status: AC
  Administered 2022-12-28: 4 mg via INTRAVENOUS
  Filled 2022-12-28: qty 2

## 2022-12-28 MED ORDER — SODIUM CHLORIDE 0.9 % IV BOLUS
1000.0000 mL | Freq: Once | INTRAVENOUS | Status: AC
  Administered 2022-12-28: 1000 mL via INTRAVENOUS

## 2022-12-28 NOTE — ED Provider Notes (Addendum)
[place] EMERGENCY DEPARTMENT AT [facility] Provider Note   CSN: [id number] Arrival date & time: 12/28/22  1019     History  Chief Complaint  Patient presents with   Emesis    Jennifer Ingram is a 72 y.o. female.  Patient is a 72 year old female with past medical history of hypertension, hyperlipidemia, and diabetes presenting for complaints of nausea and vomiting. Patient admits to decreased appetite and nausea that started yesterday. Admits to an episode of nonbloody nonbillious emesis yesterday and this morning. Took insulin yesterday. States she ate only crackers. Did not eat or take insulin this morning. Denies abdominal pain, fever, or chills. Denies dysuria, increased frequency, or urgency. Denies chest pain or sob.   The history is provided by the patient.  Emesis Associated symptoms: no abdominal pain, no arthralgias, no chills, no cough, no fever and no sore throat        Home Medications Prior to Admission medications   Medication Sig Start Date End Date Taking? Authorizing Provider  Accu-Chek Softclix Lancets lancets Check blood sugar 3 times a day 01/06/21   Evlyn Kanner, MD  atenolol (TENORMIN) 50 MG tablet Take 1 tablet by mouth twice daily 09/20/22   Masters, Florentina Addison, DO  Continuous Blood Gluc Receiver (FREESTYLE LIBRE 3 READER) DEVI USE AS DIRECTED 07/13/22   Masters, Florentina Addison, DO  Continuous Blood Gluc Sensor (FREESTYLE LIBRE 3 SENSOR) MISC Place 1 sensor on the skin every 14 days. Use to check glucose continuously. 06/13/22   Masters, Katie, DO  empagliflozin (JARDIANCE) 10 MG TABS tablet TAKE 1 TABLET BY MOUTH ONCE DAILY BEFORE BREAKFAST 07/27/22   Masters, Katie, DO  EUTHYROX 50 MCG tablet Take 1 tablet (50 mcg total) by mouth daily. 04/27/22   Masters, Florentina Addison, DO  gabapentin (NEURONTIN) 300 MG capsule Take 1 capsule by mouth twice daily 10/13/22   Masters, Florentina Addison, DO  glucose blood (ACCU-CHEK GUIDE) test strip Check blood sugar 3 times per day 01/06/21    Evlyn Kanner, MD  insulin isophane & regular human KwikPen (HUMULIN 70/30 KWIKPEN) (70-30) 100 UNIT/ML KwikPen Inject 22 units 2 times daily 10/10/22   Masters, Florentina Addison, DO  Insulin Pen Needle (PEN NEEDLES) 31G X 5 MM MISC 1 each by Does not apply route daily. 12/05/22   Masters, Katie, DO  PARoxetine (PAXIL) 40 MG tablet TAKE 1 TABLET BY MOUTH ONCE DAILY AFTER BREAKFAST 07/23/22   Masters, Florentina Addison, DO  rosuvastatin (CRESTOR) 20 MG tablet Take 1 tablet by mouth once daily 12/28/22   Masters, Katie, DO  Semaglutide, 1 MG/DOSE, 4 MG/3ML SOPN Inject 1 mg into the skin once a week. 03/02/22   Masters, Katie, DO      Allergies    Patient has no known allergies.    Review of Systems   Review of Systems  Constitutional:  Positive for appetite change. Negative for chills and fever.  HENT:  Negative for ear pain and sore throat.   Eyes:  Negative for pain and visual disturbance.  Respiratory:  Negative for cough and shortness of breath.   Cardiovascular:  Negative for chest pain and palpitations.  Gastrointestinal:  Positive for nausea and vomiting. Negative for abdominal pain.  Genitourinary:  Negative for dysuria and hematuria.  Musculoskeletal:  Negative for arthralgias and back pain.  Skin:  Negative for color change and rash.  Neurological:  Negative for seizures and syncope.  All other systems reviewed and are negative.   Physical Exam Updated Vital Signs BP (!) 153/95  Pulse (!) 104   Temp 97.9 F (36.6 C) (Oral)   Resp 15   Ht 5' (1.524 m)   Wt 65.8 kg   SpO2 97%   BMI 28.32 kg/m  Physical Exam Vitals and nursing note reviewed.  Constitutional:      General: She is not in acute distress.    Appearance: She is well-developed.  HENT:     Head: Normocephalic and atraumatic.  Eyes:     Conjunctiva/sclera: Conjunctivae normal.  Cardiovascular:     Rate and Rhythm: Normal rate and regular rhythm.     Heart sounds: No murmur heard. Pulmonary:     Effort: Pulmonary effort is  normal. No respiratory distress.     Breath sounds: Normal breath sounds.  Abdominal:     Palpations: Abdomen is soft.     Tenderness: There is abdominal tenderness in the right lower quadrant. There is no guarding or rebound.  Musculoskeletal:        General: No swelling.     Cervical back: Neck supple.  Skin:    General: Skin is warm and dry.     Capillary Refill: Capillary refill takes less than 2 seconds.  Neurological:     Mental Status: She is alert.  Psychiatric:        Mood and Affect: Mood normal.     ED Results / Procedures / Treatments   Labs (all labs ordered are listed, but only abnormal results are displayed) Labs Reviewed  CBC WITH DIFFERENTIAL/PLATELET - Abnormal; Notable for the following components:      Result Value   WBC 13.6 (*)    Hemoglobin 15.3 (*)    HCT 46.2 (*)    Neutro Abs 10.7 (*)    All other components within normal limits  URINALYSIS, ROUTINE W REFLEX MICROSCOPIC - Abnormal; Notable for the following components:   Glucose, UA >=500 (*)    Ketones, ur 20 (*)    Protein, ur 30 (*)    All other components within normal limits  COMPREHENSIVE METABOLIC PANEL - Abnormal; Notable for the following components:   Glucose, Bld 301 (*)    BUN 27 (*)    Creatinine, Ser 1.18 (*)    Calcium 8.8 (*)    GFR, Estimated 50 (*)    All other components within normal limits  CBG MONITORING, ED - Abnormal; Notable for the following components:   Glucose-Capillary 315 (*)    All other components within normal limits  RESP PANEL BY RT-PCR (RSV, FLU A&B, COVID)  RVPGX2  LIPASE, BLOOD  TROPONIN I (HIGH SENSITIVITY)  TROPONIN I (HIGH SENSITIVITY)    EKG None  Radiology DG Chest Portable 1 View  Result Date: 12/28/2022 CLINICAL DATA:  sepsis.  Heat exposure.  Nausea.  Vomiting. EXAM: PORTABLE CHEST 1 VIEW COMPARISON:  11/06/2016. FINDINGS: Bilateral lung fields are clear. Bilateral lateral costophrenic angles are clear. Normal cardio-mediastinal  silhouette. No acute osseous abnormalities. The soft tissues are within normal limits. IMPRESSION: No active disease. Electronically Signed   By: Jules Schick M.D.   On: 12/28/2022 15:55   CT ABDOMEN PELVIS W CONTRAST  Result Date: 12/28/2022 CLINICAL DATA:  Right lower quadrant pain.  Nausea and vomiting EXAM: CT ABDOMEN AND PELVIS WITH CONTRAST TECHNIQUE: Multidetector CT imaging of the abdomen and pelvis was performed using the standard protocol following bolus administration of intravenous contrast. RADIATION DOSE REDUCTION: This exam was performed according to the departmental dose-optimization program which includes automated exposure control, adjustment of the mA  and/or kV according to patient size and/or use of iterative reconstruction technique. CONTRAST:  80mL OMNIPAQUE IOHEXOL 300 MG/ML  SOLN COMPARISON:  None Available. FINDINGS: Lower chest: Lung bases are clear. No pleural effusion moderate hiatal hernia. Coronary artery calcifications are seen. Calcifications as well in the area of the aortic valve Hepatobiliary: Fatty liver infiltration. No space-occupying liver lesion. Previous cholecystectomy. Patent portal vein. Pancreas: Unremarkable. No pancreatic ductal dilatation or surrounding inflammatory changes. Spleen: Normal in size without focal abnormality. Adrenals/Urinary Tract: Adrenal glands are unremarkable. Kidneys are normal, without renal calculi, focal lesion, or hydronephrosis. Bladder is unremarkable. Stomach/Bowel: Moderate colonic stool. There is some high density material within the lumen of the distal colon, nonspecific. Stomach and small bowel are nondilated. Cecum resides in the right midabdomen. Preserved ileocecal valve. The appendix is not well seen. No specific pericecal stranding or fluid. Of note there is a small nodular area of vascular type enhancement along a loop of jejunum seen on coronal series 8 image 63, axial image 28 of series 2 measuring 5 mm of uncertain  etiology and significance. A small vascular lesion is not excluded. Vascular/Lymphatic: Choose due Reproductive: Uterus and bilateral adnexa are unremarkable. Other: No free air or free fluid. Protuberance of the anterior abdominal wall with slight rectus muscle diastasis. Small fat containing umbilical hernia. Musculoskeletal: There is a focal area of deformity along the left transverse process L3. This could be the sequela previous trauma or fracture. There is slight adjacent soft tissue thickening. Please correlate with specific history. There is a differential IMPRESSION: Scattered colonic stool. No bowel obstruction, free air or free fluid. Poor visualization of the appendix but no pericecal stranding or fluid. Moderate hiatal hernia. Fatty liver infiltration.  Prior cholecystectomy. Lucency along the left transverse process of L3 with some adjacent soft tissue thickening. This could be the sequela of trauma but does has a differential. Please correlate with any clinical presentation/history. Otherwise further workup when appropriate such as bone scan. 5 mm nodular area of enhancement along a loop of jejunum. Possible small vascular lesion of doubtful significance. Please correlate with any specific clinical history or symptoms Electronically Signed   By: Karen Kays M.D.   On: 12/28/2022 13:17    Procedures Procedures    Medications Ordered in ED Medications  sodium chloride 0.9 % bolus 1,000 mL (0 mLs Intravenous Stopped 12/28/22 1227)  ondansetron (ZOFRAN) injection 4 mg (4 mg Intravenous Given 12/28/22 1055)  iohexol (OMNIPAQUE) 300 MG/ML solution 80 mL (80 mLs Intravenous Contrast Given 12/28/22 1217)    ED Course/ Medical Decision Making/ A&P                                 Medical Decision Making Amount and/or Complexity of Data Reviewed Labs: ordered. Radiology: ordered.  Risk Prescription drug management.   72 year old female with past medical history of hypertension,  hyperlipidemia, and diabetes presenting for complaints of nausea and vomiting.  Patient is alert and x 3, no acute distress, afebrile, stable vital signs.  Abdomen is soft with minimal tenderness in the right lower quadrant.  Diagnosis includes but is not limited to hyperglycemia, DKA, appendicitis, urinary tract infection, sepsis, etc.  Nausea/vomiting/decreased appetite: RLQ abdominal pain:  Labs concerning for some mild leukocytosis.  Otherwise stable liver profile, lipase, and renal function.  Stable electrolytes.  Stable EKG and troponins.  Chest x-ray demonstrates no pneumonia.  Abdominal CT imaging demonstrates no acute infectious etiology.  No acute intra-abdominal process.  No bowel obstruction.  UA demonstrates no UTI.   COVID-negative.  No sepsis at this time.  Patient's symptoms possibly secondary to her hyperglycemia.  She states she has not been taking her insulin because of poor appetite.  Patient given Zofran and had improvement of symptoms at this time.  Given saltine crackers and diet Cole at the bedside.  Able to tolerate p.o.  IV fluids was given.  No DKA currently.  No anion gap.  Safe for return home and to continue taking insulin regimen as prescribed.  Recommend close follow-up with PCP in the next 3 to 5 days if symptoms of decreased appetite and nausea do not improve.  Patient in no distress and overall condition improved here in the ED. Detailed discussions were had with the patient regarding current findings, and need for close f/u with PCP or on call doctor. The patient has been instructed to return immediately if the symptoms worsen in any way for re-evaluation. Patient verbalized understanding and is in agreement with current care plan. All questions answered prior to discharge.        Final Clinical Impression(s) / ED Diagnoses Final diagnoses:  Nausea and vomiting, unspecified vomiting type  RLQ abdominal pain  Decreased appetite    Rx / DC Orders ED  Discharge Orders     None         Franne Forts, DO 12/28/22 1627    Franne Forts, DO 01/14/23 [phone]

## 2022-12-28 NOTE — ED Triage Notes (Signed)
Pt BIB [facility] from home C/O heat exposure, nausea, emesis X 1. Pt reports A/C is out in house and she has been getting overheated.

## 2022-12-28 NOTE — ED Notes (Signed)
Pt ambulatory to [facility]

## 2022-12-28 NOTE — ED Notes (Signed)
Pt transported to CT via stretcher at this time.  

## 2022-12-28 NOTE — Discharge Instructions (Addendum)
Drink lots of water! Stay hydrated due to the heat. Zofran for nausea sent to pharmacy. Come back if symptoms worsen.

## 2023-01-04 ENCOUNTER — Encounter (HOSPITAL_COMMUNITY): Payer: Self-pay

## 2023-01-04 ENCOUNTER — Inpatient Hospital Stay (HOSPITAL_COMMUNITY)
Admission: EM | Admit: 2023-01-04 | Discharge: 2023-01-06 | DRG: 312 | Disposition: A | Discharge status: Home / Self Care | Payer: 59 | Attending: Internal Medicine | Admitting: Internal Medicine

## 2023-01-04 ENCOUNTER — Emergency Department (HOSPITAL_COMMUNITY): Payer: 59

## 2023-01-04 ENCOUNTER — Other Ambulatory Visit: Payer: Self-pay

## 2023-01-04 DIAGNOSIS — Z7985 Long-term (current) use of injectable non-insulin antidiabetic drugs: Secondary | ICD-10-CM

## 2023-01-04 DIAGNOSIS — E1122 Type 2 diabetes mellitus with diabetic chronic kidney disease: Secondary | ICD-10-CM | POA: Diagnosis present

## 2023-01-04 DIAGNOSIS — N179 Acute kidney failure, unspecified: Secondary | ICD-10-CM | POA: Diagnosis not present

## 2023-01-04 DIAGNOSIS — F32A Depression, unspecified: Secondary | ICD-10-CM | POA: Diagnosis present

## 2023-01-04 DIAGNOSIS — N1832 Chronic kidney disease, stage 3b: Secondary | ICD-10-CM

## 2023-01-04 DIAGNOSIS — M159 Polyosteoarthritis, unspecified: Secondary | ICD-10-CM | POA: Diagnosis present

## 2023-01-04 DIAGNOSIS — Z9049 Acquired absence of other specified parts of digestive tract: Secondary | ICD-10-CM | POA: Diagnosis not present

## 2023-01-04 DIAGNOSIS — Z7984 Long term (current) use of oral hypoglycemic drugs: Secondary | ICD-10-CM

## 2023-01-04 DIAGNOSIS — E11649 Type 2 diabetes mellitus with hypoglycemia without coma: Secondary | ICD-10-CM | POA: Diagnosis not present

## 2023-01-04 DIAGNOSIS — E785 Hyperlipidemia, unspecified: Secondary | ICD-10-CM | POA: Diagnosis not present

## 2023-01-04 DIAGNOSIS — E876 Hypokalemia: Secondary | ICD-10-CM

## 2023-01-04 DIAGNOSIS — R2681 Unsteadiness on feet: Secondary | ICD-10-CM | POA: Diagnosis not present

## 2023-01-04 DIAGNOSIS — Z801 Family history of malignant neoplasm of trachea, bronchus and lung: Secondary | ICD-10-CM

## 2023-01-04 DIAGNOSIS — Z806 Family history of leukemia: Secondary | ICD-10-CM | POA: Diagnosis not present

## 2023-01-04 DIAGNOSIS — Z79899 Other long term (current) drug therapy: Secondary | ICD-10-CM | POA: Diagnosis not present

## 2023-01-04 DIAGNOSIS — I951 Orthostatic hypotension: Secondary | ICD-10-CM | POA: Diagnosis not present

## 2023-01-04 DIAGNOSIS — I10 Essential (primary) hypertension: Secondary | ICD-10-CM | POA: Diagnosis present

## 2023-01-04 DIAGNOSIS — I444 Left anterior fascicular block: Secondary | ICD-10-CM | POA: Diagnosis present

## 2023-01-04 DIAGNOSIS — R42 Dizziness and giddiness: Secondary | ICD-10-CM | POA: Diagnosis not present

## 2023-01-04 DIAGNOSIS — E118 Type 2 diabetes mellitus with unspecified complications: Secondary | ICD-10-CM

## 2023-01-04 DIAGNOSIS — E860 Dehydration: Secondary | ICD-10-CM | POA: Diagnosis not present

## 2023-01-04 DIAGNOSIS — I6782 Cerebral ischemia: Secondary | ICD-10-CM | POA: Diagnosis not present

## 2023-01-04 DIAGNOSIS — R638 Other symptoms and signs concerning food and fluid intake: Secondary | ICD-10-CM

## 2023-01-04 DIAGNOSIS — I129 Hypertensive chronic kidney disease with stage 1 through stage 4 chronic kidney disease, or unspecified chronic kidney disease: Secondary | ICD-10-CM | POA: Diagnosis present

## 2023-01-04 DIAGNOSIS — R110 Nausea: Secondary | ICD-10-CM | POA: Diagnosis not present

## 2023-01-04 DIAGNOSIS — Z743 Need for continuous supervision: Secondary | ICD-10-CM | POA: Diagnosis not present

## 2023-01-04 DIAGNOSIS — D72829 Elevated white blood cell count, unspecified: Secondary | ICD-10-CM

## 2023-01-04 DIAGNOSIS — E1121 Type 2 diabetes mellitus with diabetic nephropathy: Secondary | ICD-10-CM

## 2023-01-04 DIAGNOSIS — Z794 Long term (current) use of insulin: Secondary | ICD-10-CM

## 2023-01-04 DIAGNOSIS — R112 Nausea with vomiting, unspecified: Secondary | ICD-10-CM | POA: Diagnosis not present

## 2023-01-04 DIAGNOSIS — E119 Type 2 diabetes mellitus without complications: Secondary | ICD-10-CM

## 2023-01-04 DIAGNOSIS — Z79890 Hormone replacement therapy (postmenopausal): Secondary | ICD-10-CM

## 2023-01-04 DIAGNOSIS — E039 Hypothyroidism, unspecified: Secondary | ICD-10-CM | POA: Diagnosis not present

## 2023-01-04 DIAGNOSIS — E1165 Type 2 diabetes mellitus with hyperglycemia: Secondary | ICD-10-CM

## 2023-01-04 DIAGNOSIS — R55 Syncope and collapse: Secondary | ICD-10-CM | POA: Diagnosis not present

## 2023-01-04 DIAGNOSIS — G9389 Other specified disorders of brain: Secondary | ICD-10-CM | POA: Diagnosis not present

## 2023-01-04 LAB — CBC WITH DIFFERENTIAL/PLATELET
Abs Immature Granulocytes: 0.05 10*3/uL (ref 0.00–0.07)
Basophils Absolute: 0.1 10*3/uL (ref 0.0–0.1)
Basophils Relative: 0 %
Eosinophils Absolute: 0.5 10*3/uL (ref 0.0–0.5)
Eosinophils Relative: 4 %
HCT: 45 % (ref 36.0–46.0)
Hemoglobin: 14.7 g/dL (ref 12.0–15.0)
Immature Granulocytes: 0 %
Lymphocytes Relative: 25 %
Lymphs Abs: 3.1 10*3/uL (ref 0.7–4.0)
MCH: 31 pg (ref 26.0–34.0)
MCHC: 32.7 g/dL (ref 30.0–36.0)
MCV: 94.9 fL (ref 80.0–100.0)
Monocytes Absolute: 1.1 10*3/uL — ABNORMAL HIGH (ref 0.1–1.0)
Monocytes Relative: 9 %
Neutro Abs: 7.5 10*3/uL (ref 1.7–7.7)
Neutrophils Relative %: 62 %
Platelets: 225 10*3/uL (ref 150–400)
RBC: 4.74 MIL/uL (ref 3.87–5.11)
RDW: 12.2 % (ref 11.5–15.5)
WBC: 12.4 10*3/uL — ABNORMAL HIGH (ref 4.0–10.5)
nRBC: 0 % (ref 0.0–0.2)

## 2023-01-04 LAB — TROPONIN I (HIGH SENSITIVITY)
Troponin I (High Sensitivity): 5 ng/L (ref <18)
Troponin I (High Sensitivity): 5 ng/L (ref <18)

## 2023-01-04 LAB — COMPREHENSIVE METABOLIC PANEL
ALT: 14 U/L (ref 0–44)
AST: 20 U/L (ref 15–41)
Albumin: 3.8 g/dL (ref 3.5–5.0)
Alkaline Phosphatase: 68 U/L (ref 38–126)
Anion gap: 11 (ref 5–15)
BUN: 45 mg/dL — ABNORMAL HIGH (ref 8–23)
CO2: 28 mmol/L (ref 22–32)
Calcium: 9.2 mg/dL (ref 8.9–10.3)
Chloride: 102 mmol/L (ref 98–111)
Creatinine, Ser: 1.74 mg/dL — ABNORMAL HIGH (ref 0.44–1.00)
GFR, Estimated: 31 mL/min — ABNORMAL LOW (ref >60)
Glucose, Bld: 69 mg/dL — ABNORMAL LOW (ref 70–99)
Potassium: 3 mmol/L — ABNORMAL LOW (ref 3.5–5.1)
Sodium: 141 mmol/L (ref 135–145)
Total Bilirubin: 0.3 mg/dL (ref 0.3–1.2)
Total Protein: 7.3 g/dL (ref 6.5–8.1)

## 2023-01-04 LAB — URINALYSIS, ROUTINE W REFLEX MICROSCOPIC
Bilirubin Urine: NEGATIVE
Glucose, UA: >=500 mg/dL — AB
Hgb urine dipstick: NEGATIVE
Ketones, ur: NEGATIVE mg/dL
Nitrite: NEGATIVE
Protein, ur: NEGATIVE mg/dL
Specific Gravity, Urine: 1.01 (ref 1.005–1.030)
pH: 5 (ref 5.0–8.0)

## 2023-01-04 LAB — VITAMIN B12: Vitamin B-12: 868 pg/mL (ref 180–914)

## 2023-01-04 LAB — TSH: TSH: 3.76 u[IU]/mL (ref 0.350–4.500)

## 2023-01-04 LAB — GLUCOSE, CAPILLARY: Glucose-Capillary: 102 mg/dL — ABNORMAL HIGH (ref 70–99)

## 2023-01-04 LAB — CBG MONITORING, ED
Glucose-Capillary: 80 mg/dL (ref 70–99)
Glucose-Capillary: 82 mg/dL (ref 70–99)
Glucose-Capillary: 82 mg/dL (ref 70–99)

## 2023-01-04 LAB — MAGNESIUM: Magnesium: 2 mg/dL (ref 1.7–2.4)

## 2023-01-04 MED ORDER — GABAPENTIN 300 MG PO CAPS
300.0000 mg | ORAL_CAPSULE | Freq: Two times a day (BID) | ORAL | Status: DC
  Administered 2023-01-04 – 2023-01-06 (×4): 300 mg via ORAL
  Filled 2023-01-04 (×4): qty 1

## 2023-01-04 MED ORDER — ENOXAPARIN SODIUM 30 MG/0.3ML IJ SOSY
30.0000 mg | PREFILLED_SYRINGE | INTRAMUSCULAR | Status: DC
  Administered 2023-01-04: 30 mg via SUBCUTANEOUS
  Filled 2023-01-04: qty 0.3

## 2023-01-04 MED ORDER — PAROXETINE HCL 20 MG PO TABS
40.0000 mg | ORAL_TABLET | Freq: Every day | ORAL | Status: DC
  Administered 2023-01-04 – 2023-01-06 (×3): 40 mg via ORAL
  Filled 2023-01-04 (×3): qty 2

## 2023-01-04 MED ORDER — ONDANSETRON HCL 4 MG/2ML IJ SOLN: 4.0000 mg | Freq: Four times a day (QID) | INTRAMUSCULAR | Status: DC | PRN

## 2023-01-04 MED ORDER — SODIUM CHLORIDE 0.9 % IV SOLN: INTRAVENOUS | Status: DC

## 2023-01-04 MED ORDER — SODIUM CHLORIDE 0.9% FLUSH
3.0000 mL | Freq: Two times a day (BID) | INTRAVENOUS | Status: DC
  Administered 2023-01-04 – 2023-01-06 (×3): 3 mL via INTRAVENOUS

## 2023-01-04 MED ORDER — ONDANSETRON HCL 4 MG/2ML IJ SOLN
4.0000 mg | Freq: Once | INTRAMUSCULAR | Status: AC
  Administered 2023-01-04: 4 mg via INTRAVENOUS
  Filled 2023-01-04: qty 2

## 2023-01-04 MED ORDER — LACTATED RINGERS IV BOLUS
1000.0000 mL | Freq: Once | INTRAVENOUS | Status: AC
  Administered 2023-01-04: 1000 mL via INTRAVENOUS

## 2023-01-04 MED ORDER — POTASSIUM CHLORIDE CRYS ER 20 MEQ PO TBCR
40.0000 meq | EXTENDED_RELEASE_TABLET | Freq: Once | ORAL | Status: AC
  Administered 2023-01-04: 40 meq via ORAL
  Filled 2023-01-04: qty 2

## 2023-01-04 MED ORDER — MELATONIN 5 MG PO TABS
5.0000 mg | ORAL_TABLET | Freq: Every evening | ORAL | Status: DC | PRN
  Administered 2023-01-04 – 2023-01-05 (×2): 5 mg via ORAL
  Filled 2023-01-04 (×2): qty 1

## 2023-01-04 MED ORDER — ACETAMINOPHEN 650 MG RE SUPP: 650.0000 mg | Freq: Four times a day (QID) | RECTAL | Status: DC | PRN

## 2023-01-04 MED ORDER — INSULIN ASPART 100 UNIT/ML IJ SOLN
0.0000 [IU] | Freq: Three times a day (TID) | INTRAMUSCULAR | Status: DC
  Administered 2023-01-05 (×2): 2 [IU] via SUBCUTANEOUS
  Administered 2023-01-05: 1 [IU] via SUBCUTANEOUS
  Administered 2023-01-06 (×2): 2 [IU] via SUBCUTANEOUS

## 2023-01-04 MED ORDER — ONDANSETRON HCL 4 MG PO TABS: 4.0000 mg | ORAL_TABLET | Freq: Four times a day (QID) | ORAL | Status: DC | PRN

## 2023-01-04 MED ORDER — ONDANSETRON 4 MG PO TBDP: 4.0000 mg | ORAL_TABLET | Freq: Three times a day (TID) | ORAL | 0 refills | Status: DC | PRN

## 2023-01-04 MED ORDER — ACETAMINOPHEN 325 MG PO TABS: 650.0000 mg | ORAL_TABLET | Freq: Four times a day (QID) | ORAL | Status: DC | PRN

## 2023-01-04 MED ORDER — LEVOTHYROXINE SODIUM 50 MCG PO TABS
50.0000 ug | ORAL_TABLET | Freq: Every day | ORAL | Status: DC
  Administered 2023-01-05 – 2023-01-06 (×2): 50 ug via ORAL
  Filled 2023-01-04 (×2): qty 1

## 2023-01-04 MED ORDER — ROSUVASTATIN CALCIUM 20 MG PO TABS
20.0000 mg | ORAL_TABLET | Freq: Every day | ORAL | Status: DC
  Administered 2023-01-05 – 2023-01-06 (×2): 20 mg via ORAL
  Filled 2023-01-04 (×2): qty 1

## 2023-01-04 NOTE — Assessment & Plan Note (Signed)
Baseline creatinine appears to be around 1.1-1.4 Hx of poor PO intake, orthostatic hypotension and now creatinine of 1.74 Likely pre pre renal due to aforementioned issues UA with no hgb Received 2L IVF in Ed, continue gentle IVF Hold BP medication, especially jardiance Strict I/O Avoid nephrotoxic drugs Trend

## 2023-01-04 NOTE — ED Triage Notes (Signed)
EMS reports from home, called out for N/V last week with falls. Today had syncopal episode with fall with no head strike, obvious injuries or blood thinners. Denies N/V/D since Tuesday, but continues with poor appetite.  BP 113/78 HR 80 RR 18 Sp02  CBG 104  20ga RAC .

## 2023-01-04 NOTE — ED Notes (Signed)
Pt does not have a pacemaker present

## 2023-01-04 NOTE — Assessment & Plan Note (Signed)
Hold atenolol for now Compression hose Trend

## 2023-01-04 NOTE — ED Notes (Signed)
ED TO INPATIENT HANDOFF REPORT  ED Nurse Name and Phone #:  Jeanmarie Hubert EMT-P  S Name/Age/Gender Jennifer Ingram 72 y.o. female Room/Bed: WA23/WA23  Code Status   Code Status: Full Code  Home/SNF/Other Home Patient oriented to: self, place, time, and situation Is this baseline? Yes   Triage Complete: Triage complete  Chief Complaint Syncope and collapse [R55]  Triage Note EMS reports from home, called out for N/V last week with falls. Today had syncopal episode with fall with no head strike, obvious injuries or blood thinners. Denies N/V/D since Tuesday, but continues with poor appetite.  BP 113/78 HR 80 RR 18 Sp02  CBG 104  20ga RAC .   Allergies No Known Allergies  Level of Care/Admitting Diagnosis ED Disposition     ED Disposition  Admit   Condition  --   Comment  Hospital Area: [facility] [[id number]]  Level of Care: Telemetry [5]  Admit to tele based on following criteria: Eval of Syncope  May place patient in observation at [facility] or [facility] if equivalent level of care is available:: Yes  Covid Evaluation: Asymptomatic - no recent exposure (last 10 days) testing not required  Diagnosis: Syncope and collapse [780.2.ICD-9-CM]  Admitting Physician: Orland Mustard [[id number]]  Attending Physician: Orland Mustard [[id number]]          B Medical/Surgery History Past Medical History:  Diagnosis Date   Arthritis    bilateral knees and back;   Depression    on meds, working well   Diabetes mellitus without complication (HCC) 05/31/2012   on meds   Heart palpitations    rapid   Hyperlipidemia    on meds   Hypertension    on meds   Past Surgical History:  Procedure Laterality Date   APPENDECTOMY  1968   CESAREAN SECTION  1980   CHOLECYSTECTOMY  1985   TONSILLECTOMY  1960     A IV Location/Drains/Wounds Patient Lines/Drains/Airways Status     Active Line/Drains/Airways     Name Placement date Placement time Site  Days   Peripheral IV 01/04/23 20 G Anterior;Proximal;Right Forearm 01/04/23  1259  Forearm  less than 1            Intake/Output Last 24 hours No intake or output data in the 24 hours ending 01/04/23 1811  Labs/Imaging Results for orders placed or performed during the hospital encounter of 01/04/23 (from the past 48 hour(s))  CBC WITH DIFFERENTIAL     Status: Abnormal   Collection Time: 01/04/23  1:14 PM  Result Value Ref Range   WBC 12.4 (H) 4.0 - 10.5 K/uL   RBC 4.74 3.87 - 5.11 MIL/uL   Hemoglobin 14.7 12.0 - 15.0 g/dL   HCT 96.2 95.2 - 84.1 %   MCV 94.9 80.0 - 100.0 fL   MCH 31.0 26.0 - 34.0 pg   MCHC 32.7 30.0 - 36.0 g/dL   RDW 32.4 40.1 - 02.7 %   Platelets 225 150 - 400 K/uL   nRBC 0.0 0.0 - 0.2 %   Neutrophils Relative % 62 %   Neutro Abs 7.5 1.7 - 7.7 K/uL   Lymphocytes Relative 25 %   Lymphs Abs 3.1 0.7 - 4.0 K/uL   Monocytes Relative 9 %   Monocytes Absolute 1.1 (H) 0.1 - 1.0 K/uL   Eosinophils Relative 4 %   Eosinophils Absolute 0.5 0.0 - 0.5 K/uL   Basophils Relative 0 %   Basophils Absolute 0.1 0.0 - 0.1  K/uL   Immature Granulocytes 0 %   Abs Immature Granulocytes 0.05 0.00 - 0.07 K/uL    Comment: Performed at [facility], [address] [place], [place] [address]  Comprehensive metabolic panel     Status: Abnormal   Collection Time: 01/04/23  1:14 PM  Result Value Ref Range   Sodium 141 135 - 145 mmol/L   Potassium 3.0 (L) 3.5 - 5.1 mmol/L   Chloride 102 98 - 111 mmol/L   CO2 28 22 - 32 mmol/L   Glucose, Bld 69 (L) 70 - 99 mg/dL    Comment: Glucose reference range applies only to samples taken after fasting for at least 8 hours.   BUN 45 (H) 8 - 23 mg/dL   Creatinine, Ser 1.91 (H) 0.44 - 1.00 mg/dL   Calcium 9.2 8.9 - 47.8 mg/dL   Total Protein 7.3 6.5 - 8.1 g/dL   Albumin 3.8 3.5 - 5.0 g/dL   AST 20 15 - 41 U/L   ALT 14 0 - 44 U/L   Alkaline Phosphatase 68 38 - 126 U/L   Total Bilirubin 0.3 0.3 - 1.2 mg/dL   GFR, Estimated  31 (L) >60 mL/min    Comment: (NOTE) Calculated using the CKD-EPI Creatinine Equation (2021)    Anion gap 11 5 - 15    Comment: Performed at [facility], [address], [place], [place] [address]  Urinalysis, Routine w reflex microscopic -Urine, Clean Catch     Status: Abnormal   Collection Time: 01/04/23  1:14 PM  Result Value Ref Range   Color, Urine YELLOW YELLOW   APPearance HAZY (A) CLEAR   Specific Gravity, Urine 1.010 1.005 - 1.030   pH 5.0 5.0 - 8.0   Glucose, UA >=500 (A) NEGATIVE mg/dL   Hgb urine dipstick NEGATIVE NEGATIVE   Bilirubin Urine NEGATIVE NEGATIVE   Ketones, ur NEGATIVE NEGATIVE mg/dL   Protein, ur NEGATIVE NEGATIVE mg/dL   Nitrite NEGATIVE NEGATIVE   Leukocytes,Ua MODERATE (A) NEGATIVE   RBC / HPF 0-5 0 - 5 RBC/hpf   WBC, UA 11-20 0 - 5 WBC/hpf   Bacteria, UA RARE (A) NONE SEEN   Squamous Epithelial / HPF 6-10 0 - 5 /HPF   Hyaline Casts, UA PRESENT     Comment: Performed at [facility], [address], [place], [place] [address]  Troponin I (High Sensitivity)     Status: None   Collection Time: 01/04/23  1:14 PM  Result Value Ref Range   Troponin I (High Sensitivity) 5 <18 ng/L    Comment: (NOTE) Elevated high sensitivity troponin I (hsTnI) values and significant  changes across serial measurements may suggest ACS but many other  chronic and acute conditions are known to elevate hsTnI results.  Refer to the "Links" section for chest pain algorithms and additional  guidance. Performed at [facility], [address] [place], [place] [address]   CBG monitoring, ED     Status: None   Collection Time: 01/04/23  1:31 PM  Result Value Ref Range   Glucose-Capillary 80 70 - 99 mg/dL    Comment: Glucose reference range applies only to samples taken after fasting for at least 8 hours.  Troponin I (High Sensitivity)     Status: None   Collection Time: 01/04/23  3:33 PM  Result Value Ref Range    Troponin I (High Sensitivity) 5 <18 ng/L    Comment: (NOTE) Elevated high sensitivity troponin I (hsTnI)  values and significant  changes across serial measurements may suggest ACS but many other  chronic and acute conditions are known to elevate hsTnI results.  Refer to the "Links" section for chest pain algorithms and additional  guidance. Performed at [facility], [address] [place], [place] [address]   POC CBG, ED     Status: None   Collection Time: 01/04/23  3:38 PM  Result Value Ref Range   Glucose-Capillary 82 70 - 99 mg/dL    Comment: Glucose reference range applies only to samples taken after fasting for at least 8 hours.  POC CBG, ED     Status: None   Collection Time: 01/04/23  4:14 PM  Result Value Ref Range   Glucose-Capillary 82 70 - 99 mg/dL    Comment: Glucose reference range applies only to samples taken after fasting for at least 8 hours.   CT Head Wo Contrast  Result Date: 01/04/2023 CLINICAL DATA:  Syncopal episode. EXAM: CT HEAD WITHOUT CONTRAST TECHNIQUE: Contiguous axial images were obtained from the base of the skull through the vertex without intravenous contrast. RADIATION DOSE REDUCTION: This exam was performed according to the departmental dose-optimization program which includes automated exposure control, adjustment of the mA and/or kV according to patient size and/or use of iterative reconstruction technique. COMPARISON:  None Available. FINDINGS: Brain: There is mild cerebral atrophy with widening of the extra-axial spaces and ventricular dilatation. There are areas of decreased attenuation within the white matter tracts of the supratentorial brain, consistent with microvascular disease changes. Vascular: No hyperdense vessel or unexpected calcification. Skull: Normal. Negative for fracture or focal lesion. Sinuses/Orbits: No acute finding. Other: None. IMPRESSION: 1. Generalized cerebral atrophy with chronic white matter small vessel  ischemic changes. 2. No acute intracranial abnormality. Electronically Signed   By: Aram Candela M.D.   On: 01/04/2023 15:47    Pending Labs Unresulted Labs (From admission, onward)     Start     Ordered   01/05/23 0500  Basic metabolic panel  Tomorrow morning,   R        01/04/23 1808   01/05/23 0500  CBC  Tomorrow morning,   R        01/04/23 1808   01/04/23 1808  TSH  Once,   R        01/04/23 1808   01/04/23 1806  HIV Antibody (routine testing w rflx)  (HIV Antibody (Routine testing w reflex) panel)  Once,   R        01/04/23 1808   01/04/23 1751  Magnesium  Once,   STAT        01/04/23 1751            Vitals/Pain Today's Vitals   01/04/23 1515 01/04/23 1530 01/04/23 1545 01/04/23 1630  BP:  95/68  (!) 103/57  Pulse: 73 75  70  Resp: 12 17 15 14   Temp:      TempSrc:      SpO2: 100% 99%  94%  PainSc:        Isolation Precautions No active isolations  Medications Medications  sodium chloride flush (NS) 0.9 % injection 3 mL (has no administration in time range)  enoxaparin (LOVENOX) injection 30 mg (has no administration in time range)  0.9 %  sodium chloride infusion (has no administration in time range)  acetaminophen (TYLENOL) tablet 650 mg (has no administration in time range)    Or  acetaminophen (TYLENOL) suppository 650 mg (has no administration  in time range)  ondansetron (ZOFRAN) tablet 4 mg (has no administration in time range)    Or  ondansetron (ZOFRAN) injection 4 mg (has no administration in time range)  lactated ringers bolus 1,000 mL (0 mLs Intravenous Stopped 01/04/23 1535)  ondansetron (ZOFRAN) injection 4 mg (4 mg Intravenous Given 01/04/23 1332)  potassium chloride SA (KLOR-CON M) CR tablet 40 mEq (40 mEq Oral Given 01/04/23 1417)  lactated ringers bolus 1,000 mL (1,000 mLs Intravenous New Bag/Given 01/04/23 1718)    Mobility walks     Focused Assessments See Chart   R Recommendations: See Admitting Provider Note  Report given to:  Marcene Corning RN

## 2023-01-04 NOTE — ED Provider Notes (Signed)
[place] EMERGENCY DEPARTMENT AT [facility] Provider Note   CSN: [id number] Arrival date & time: 01/04/23  1248     History  Chief Complaint  Patient presents with   Loss of Consciousness    Jennifer Ingram is a 72 y.o. female with PMH as listed below who presents with syncope. Patient BIBEMS from home, called out for N/V last week with a fall at home. She became dizzy and did not strike her head or lose consciousness. She states that since that time she has not really been able to walk due to significant dizziness and unsteadiness on her feet that occurs when she stands and walks around.  She does not feel that dizziness that she states he relies down.  She denies any loss of consciousness or other falls or syncopal episodes.  She does not take any blood thinners.  She denies any headache or neck pain, any pain anywhere from the other fall.  She also has had still some residual nausea and significantly decreased p.o. intake and poor appetite since that time.  Denies any fever/chills, chest pain, shortness of breath, abdominal pain, urinary symptoms, diarrhea constipation, hematochezia/melena.  She denies any back pain or weakness in her legs, numbness tingling, saddle anesthesia.  EMS vitals:  BP 113/78 HR 80 RR 18 Sp02  CBG 104   Past Medical History:  Diagnosis Date   Arthritis    bilateral knees and back;   Depression    on meds, working well   Diabetes mellitus without complication (HCC) 05/31/2012   on meds   Heart palpitations    rapid   Hyperlipidemia    on meds   Hypertension    on meds       Home Medications Prior to Admission medications   Medication Sig Start Date End Date Taking? Authorizing Provider  Accu-Chek Softclix Lancets lancets Check blood sugar 3 times a day 01/06/21   Evlyn Kanner, MD  atenolol (TENORMIN) 50 MG tablet Take 1 tablet by mouth twice daily 09/20/22   Masters, Florentina Addison, DO  Continuous Blood Gluc Receiver (FREESTYLE LIBRE 3  READER) DEVI USE AS DIRECTED 07/13/22   Masters, Florentina Addison, DO  Continuous Blood Gluc Sensor (FREESTYLE LIBRE 3 SENSOR) MISC Place 1 sensor on the skin every 14 days. Use to check glucose continuously. 06/13/22   Masters, Katie, DO  empagliflozin (JARDIANCE) 10 MG TABS tablet TAKE 1 TABLET BY MOUTH ONCE DAILY BEFORE BREAKFAST 07/27/22   Masters, Katie, DO  EUTHYROX 50 MCG tablet Take 1 tablet (50 mcg total) by mouth daily. 04/27/22   Masters, Florentina Addison, DO  gabapentin (NEURONTIN) 300 MG capsule Take 1 capsule by mouth twice daily 10/13/22   Masters, Florentina Addison, DO  glucose blood (ACCU-CHEK GUIDE) test strip Check blood sugar 3 times per day 01/06/21   Evlyn Kanner, MD  insulin isophane & regular human KwikPen (HUMULIN 70/30 KWIKPEN) (70-30) 100 UNIT/ML KwikPen Inject 22 units 2 times daily 10/10/22   Masters, Florentina Addison, DO  Insulin Pen Needle (PEN NEEDLES) 31G X 5 MM MISC 1 each by Does not apply route daily. 12/05/22   Masters, Katie, DO  ondansetron (ZOFRAN) 4 MG tablet Take 1 tablet (4 mg total) by mouth every 6 (six) hours. 12/28/22   Edwin Dada P, DO  PARoxetine (PAXIL) 40 MG tablet TAKE 1 TABLET BY MOUTH ONCE DAILY AFTER BREAKFAST 07/23/22   Masters, Florentina Addison, DO  rosuvastatin (CRESTOR) 20 MG tablet Take 1 tablet by mouth once daily 12/28/22   Masters, Florentina Addison,  DO  Semaglutide, 1 MG/DOSE, 4 MG/3ML SOPN Inject 1 mg into the skin once a week. 03/02/22   Masters, Katie, DO      Allergies    Patient has no known allergies.    Review of Systems   Review of Systems A 10 point review of systems was performed and is negative unless otherwise reported in HPI.  Physical Exam Updated Vital Signs BP 114/74 (BP Location: Left Arm)   Pulse 73   Temp 97.6 F (36.4 C) (Oral)   Resp 12   SpO2 97%  Physical Exam General: Normal appearing female, lying in bed.  HEENT: NCAT, PERRLA, EOMI, no nystagmus, Sclera anicteric, MMM, trachea midline. Tongue protrudes midline.  Cardiology: RRR, no murmurs/rubs/gallops. BL radial and DP  pulses equal bilaterally.  Resp: Normal respiratory rate and effort. CTAB, no wheezes, rhonchi, crackles.  Abd: Soft, non-tender, non-distended. No rebound tenderness or guarding.  GU: Deferred. MSK: No peripheral edema or signs of trauma. Extremities without deformity or TTP. No cyanosis or clubbing. Skin: warm, dry. No rashes or lesions. Back: No C-spine TTP Neuro: A&Ox4, CNs II-XII grossly intact. 5/5 strength upper and lower extremities. Sensation grossly intact. Normal speech. Normal repetition.  Psych: Normal mood and affect.   ED Results / Procedures / Treatments   Labs (all labs ordered are listed, but only abnormal results are displayed) Labs Reviewed  CBC WITH DIFFERENTIAL/PLATELET - Abnormal; Notable for the following components:      Result Value   WBC 12.4 (*)    Monocytes Absolute 1.1 (*)    All other components within normal limits  COMPREHENSIVE METABOLIC PANEL - Abnormal; Notable for the following components:   Potassium 3.0 (*)    Glucose, Bld 69 (*)    BUN 45 (*)    Creatinine, Ser 1.74 (*)    GFR, Estimated 31 (*)    All other components within normal limits  URINALYSIS, ROUTINE W REFLEX MICROSCOPIC  CBG MONITORING, ED  TROPONIN I (HIGH SENSITIVITY)  TROPONIN I (HIGH SENSITIVITY)    EKG EKG Interpretation Date/Time:  Thursday January 04 2023 13:19:25 EDT Ventricular Rate:  77 PR Interval:  134 QRS Duration:  106 QT Interval:  416 QTC Calculation: 471 R Axis:   -89  Text Interpretation: Sinus rhythm Left anterior fascicular block Anterolateral infarct, old Similar to prior EKG Confirmed by Vivi Barrack [phone]) on 01/04/2023 1:41:49 PM  Radiology No results found.  Procedures Procedures    Medications Ordered in ED Medications  lactated ringers bolus 1,000 mL (1,000 mLs Intravenous New Bag/Given 01/04/23 1332)  ondansetron (ZOFRAN) injection 4 mg (4 mg Intravenous Given 01/04/23 1332)  potassium chloride SA (KLOR-CON M) CR tablet 40 mEq (40 mEq  Oral Given 01/04/23 1417)    ED Course/ Medical Decision Making/ A&P                          Medical Decision Making Amount and/or Complexity of Data Reviewed Labs: ordered. Decision-making details documented in ED Course. Radiology: ordered. ECG/medicine tests: ordered.  Risk Prescription drug management.    This patient presents to the ED for concern of dizziness, inability to walk d/t dizziness, nausea; this involves an extensive number of treatment options, and is a complaint that carries with it a high risk of complications and morbidity.  I considered the following differential and admission for this acute, potentially life threatening condition.   MDM:    W/ report of N/V and decreased PO intake,  consider orthostatic dizziness top of differential. Orthostatic vitals signs are grossly positive with BP dropping to 74 mmHg with standing.  She does not have any abdominal pain or tenderness palpation to indicate an SBO as the cause of her nausea.  Must consider electrolyte derangements, dehydration, renal injury as result of her decreased p.o. intake, give fluids.  She has not been eating and consider hypoglycemia.  She did not hit her head and has no headache or neck pain to indicate ICH, but with report of persistent dizziness and fall will get CT head to rule out ICH or hydrocephalus.  She does not have any chest pain but given initial report of syncope I have ordered a troponin and will consider it for weakness dizziness and nausea for atypical ACS.  Reassuringly EKG does not demonstrate any signs of ischemia.  Patient reports that she has "not been able to walk" and states that her son was worried about a potential stroke.  She does not have any vertigo, no dizziness that she lays in 6, she has no focal neurodeficits and her NIH stroke scale is 0.  I have overall very low concern for posterior circulation CVA and believe her symptoms are much more likely due to decreased p.o. intake  dehydration and orthostasis.  She denies any fevers or chills, cough, urinary symptoms to indicate an infection as a cause of her symptoms. She has no report of GIB or other bleeding to indicate anemia and her Hgb is stable.   Clinical Course as of 01/04/23 1500  Thu Jan 04, 2023  1341 WBC(!): 12.4 Persistent leukocytosis c/w prior baseline [HN]  1341 Hemoglobin: 14.7 No anemia [HN]  1341 Glucose-Capillary: 80 wnl [HN]  1344 Positive orthostatic vitals, BP dropped to 74 systolic w/ standing. Will receive fluids. [HN]  1404 Troponin I (High Sensitivity): 5 neg [HN]  1405 Potassium(!): 3.0 Mild hypokalemia, will replete [HN]  1405 Creatinine(!): 1.74 BL 1.2-1.5, mildly elevated Cr and BUN, likely prerenal  [HN]  1405 Glucose(!): 69 Mild hypoglycemia, will give juice [HN]    Clinical Course User Index [HN] Loetta Rough, MD    Labs: I Ordered, and personally interpreted labs.  The pertinent results include:  those listed above  Imaging Studies ordered: I ordered imaging studies including CTH I independently visualized and interpreted imaging. I agree with the radiologist interpretation  Additional history obtained from chart review.    Cardiac Monitoring: The patient was maintained on a cardiac monitor.  I personally viewed and interpreted the cardiac monitored which showed an underlying rhythm of: NSR  Social Determinants of Health: Lives independently  Disposition:  Patient is signed out to the oncoming ED physician Dr. Estell Harpin who is made aware of her history, presentation, exam, workup, and plan. Plan is to obtain remainder of labs and give IVF, get CTH, reassess patient and PO challenge. Will need antiemetics to go home if discharged and encourage oral intake w/ PCP f/u.    Co morbidities that complicate the patient evaluation  Past Medical History:  Diagnosis Date   Arthritis    bilateral knees and back;   Depression    on meds, working well   Diabetes mellitus  without complication (HCC) 05/31/2012   on meds   Heart palpitations    rapid   Hyperlipidemia    on meds   Hypertension    on meds     Medicines Meds ordered this encounter  Medications   lactated ringers bolus 1,000 mL   ondansetron (  ZOFRAN) injection 4 mg   potassium chloride SA (KLOR-CON M) CR tablet 40 mEq    I have reviewed the patients home medicines and have made adjustments as needed  Problem List / ED Course: Problem List Items Addressed This Visit   None Visit Diagnoses     Orthostatic dizziness    -  Primary   Decreased oral intake       Nausea       Hypokalemia                       This note was created using dictation software, which may contain spelling or grammatical errors.    Loetta Rough, MD 01/04/23 1501

## 2023-01-04 NOTE — Assessment & Plan Note (Signed)
Check magnesium Repleted in Ed Trend

## 2023-01-04 NOTE — ED Notes (Addendum)
Checked for orthostatic changes.  Sitting: 97/68  Standing: 74/42  Pt was laid back on bed with head laid down.

## 2023-01-04 NOTE — Assessment & Plan Note (Signed)
>>  ASSESSMENT AND PLAN FOR UNCONTROLLED TYPE 2 DIABETES MELLITUS WITH HYPERGLYCEMIA, WITH LONG-TERM CURRENT USE OF INSULIN  (HCC) WRITTEN ON 01/04/2023 10:26 PM BY WADDELL RAKE, MD  June A1C is 7.1  Blood sugar low on arrival with poor PO intake Hold 70/30 Sensitive SSI with accuchecks qac/hs  Holding jardiance

## 2023-01-04 NOTE — Assessment & Plan Note (Addendum)
72 year old female presenting for 5-6 day history of dizziness upon standing, weakness with history of one syncopal episode seen in ED on 8/29 found to have orthostatic hypotension  -obs to telemetry -orthostatic vitals impressively positive. Repeat daily  -stop jardiance. Hold atenolol, TED hose  -continue IVF  -CT head negative with no neuro deficits. Dizziness only with standing  -troponin wnl x 2  -has had poor PO intake  -check TSH/B12 -PT to eval

## 2023-01-04 NOTE — Assessment & Plan Note (Signed)
Appears to be chronic dating back to at least 9 years ago Baseline 13-14, stable  F/u with oncology outpatient if not worked up in past

## 2023-01-04 NOTE — Assessment & Plan Note (Signed)
Continue paxil 40mg  daily

## 2023-01-04 NOTE — Assessment & Plan Note (Signed)
Continue crestor 

## 2023-01-04 NOTE — ED Notes (Signed)
Pt given OJ to help with blood sugar.

## 2023-01-04 NOTE — Assessment & Plan Note (Signed)
Check Tsh Continue home synthroid

## 2023-01-04 NOTE — Assessment & Plan Note (Signed)
Jennifer Ingram A1C is 7.1  Blood sugar low on arrival with poor PO intake Hold 70/30 Sensitive SSI with accuchecks qac/hs  Holding jardiance

## 2023-01-04 NOTE — ED Notes (Signed)
Patient transported to CT 

## 2023-01-04 NOTE — H&P (Signed)
History and Physical    Patient: Jennifer Ingram:[id number] DOB: 06-26-1950 DOA: 01/04/2023 DOS: the patient was seen and examined on 01/04/2023 PCP: Rudene Christians, DO  Patient coming from: Home - lives with her son. Ambulates independently    Chief Complaint: dizziness and weakness   HPI: Jennifer Ingram is a 72 y.o. female with medical history significant of T2DM, HLD, HTN, hypothyroidism, depression who presented to ED with complaints of dizziness and syncope.  She was seen on 8/29 in ED for N/V and syncope and fall. She states she has not been feeling well. She states her N/V started on 8/28 and she had two episodes. After seen at Ed on 8/29 she has had no more vomiting. She states since that time she wasn't able to walk as well as she normally does. She just felt weak and has dizziness with standing. She has no dizziness with head turning. She denies any headache, denies hitting head when she fell on 8/29 or vision changes. Denies any other focal deficits, just global weakness. She also states she has not been eating and drinking as well as she normally does. She is on jardiance for her diabetes  as well as atenolol for her blood pressure. No recent medication change.    Denies any fever/chills, vision changes/headaches, chest pain or palpitations, shortness of breath or cough, abdominal pain, N/V/D, dysuria or leg swelling.    She does not smoke or drink alcohol.   ER Course:  vitals: afebrile, bp: 118/80, HR: 78, RR: 16, oxygen: 100%RA Orthostatic vitals: lying: 100/77/HR: 75>81/57,78> 71/30, 80 Pertinent labs: wbc: 12.4, potassium: 3.0, BUN: 45, creatinine: 1.74, troponin wnl x 2,  CT head: generalized cerebral atrophy with chronic white matter and small vessel ischemic changes. No acute finding In ED: given 2L IVF bolus, potassium. TRH asked to admit.   Review of Systems: As mentioned in the history of present illness. All other systems reviewed and are negative. Past Medical  History:  Diagnosis Date   Arthritis    bilateral knees and back;   Depression    on meds, working well   Diabetes mellitus without complication (HCC) 05/31/2012   on meds   Heart palpitations    rapid   Hyperlipidemia    on meds   Hypertension    on meds   Past Surgical History:  Procedure Laterality Date   APPENDECTOMY  1968   CESAREAN SECTION  1980   CHOLECYSTECTOMY  1985   TONSILLECTOMY  1960   Social History:  reports that she has never smoked. She has never used smokeless tobacco. She reports that she does not drink alcohol and does not use drugs.  No Known Allergies  Family History  Problem Relation Age of Onset   Cancer Mother        leukemia   Cancer Father        lung   Breast cancer Neg Hx    Colon polyps Neg Hx    Crohn's disease Neg Hx    Esophageal cancer Neg Hx    Stomach cancer Neg Hx    Rectal cancer Neg Hx     Prior to Admission medications   Medication Sig Start Date End Date Taking? Authorizing Provider  ondansetron (ZOFRAN-ODT) 4 MG disintegrating tablet Take 1 tablet (4 mg total) by mouth every 8 (eight) hours as needed. 01/04/23  Yes Loetta Rough, MD  Accu-Chek Softclix Lancets lancets Check blood sugar 3 times a day 01/06/21   Evlyn Kanner,  MD  atenolol (TENORMIN) 50 MG tablet Take 1 tablet by mouth twice daily 09/20/22   Masters, Florentina Addison, DO  Continuous Blood Gluc Receiver (FREESTYLE LIBRE 3 READER) DEVI USE AS DIRECTED 07/13/22   Masters, Florentina Addison, DO  Continuous Blood Gluc Sensor (FREESTYLE LIBRE 3 SENSOR) MISC Place 1 sensor on the skin every 14 days. Use to check glucose continuously. 06/13/22   Masters, Katie, DO  empagliflozin (JARDIANCE) 10 MG TABS tablet TAKE 1 TABLET BY MOUTH ONCE DAILY BEFORE BREAKFAST 07/27/22   Masters, Katie, DO  EUTHYROX 50 MCG tablet Take 1 tablet (50 mcg total) by mouth daily. 04/27/22   Masters, Florentina Addison, DO  gabapentin (NEURONTIN) 300 MG capsule Take 1 capsule by mouth twice daily 10/13/22   Masters, Florentina Addison, DO   glucose blood (ACCU-CHEK GUIDE) test strip Check blood sugar 3 times per day 01/06/21   Evlyn Kanner, MD  insulin isophane & regular human KwikPen (HUMULIN 70/30 KWIKPEN) (70-30) 100 UNIT/ML KwikPen Inject 22 units 2 times daily 10/10/22   Masters, Florentina Addison, DO  Insulin Pen Needle (PEN NEEDLES) 31G X 5 MM MISC 1 each by Does not apply route daily. 12/05/22   Masters, Katie, DO  ondansetron (ZOFRAN) 4 MG tablet Take 1 tablet (4 mg total) by mouth every 6 (six) hours. 12/28/22   Edwin Dada P, DO  PARoxetine (PAXIL) 40 MG tablet TAKE 1 TABLET BY MOUTH ONCE DAILY AFTER BREAKFAST 07/23/22   Masters, Florentina Addison, DO  rosuvastatin (CRESTOR) 20 MG tablet Take 1 tablet by mouth once daily 12/28/22   Masters, Katie, DO  Semaglutide, 1 MG/DOSE, 4 MG/3ML SOPN Inject 1 mg into the skin once a week. 03/02/22   Masters, Florentina Addison, DO    Physical Exam: Vitals:   01/04/23 1630 01/04/23 1815 01/04/23 1909 01/04/23 1947  BP: (!) 103/57 124/89  101/77  Pulse: 70 72  80  Resp: 14 17  16   Temp:   97.6 F (36.4 C) 97.8 F (36.6 C)  TempSrc:   Oral Oral  SpO2: 94% 96%  100%  Weight:    62.2 kg  Height:    5' (1.524 m)   General:  Appears calm and comfortable and is in NAD Eyes:  PERRL, EOMI, normal lids, iris ENT:  grossly normal hearing, lips & tongue, mmm; poor dentition Neck:  no LAD, masses or thyromegaly; no carotid bruits Cardiovascular:  RRR, no m/r/g. No LE edema.  Respiratory:   CTA bilaterally with no wheezes/rales/rhonchi.  Normal respiratory effort. Abdomen:  soft, NT, ND, NABS Back:   normal alignment, no CVAT Skin:  no rash or induration seen on limited exam Musculoskeletal:  grossly normal tone BUE/BLE, good ROM, no bony abnormality Lower extremity:  No LE edema.  Limited foot exam with no ulcerations.  2+ distal pulses. Psychiatric:  grossly normal mood and affect, speech fluent and appropriate, AOx3 Neurologic:  CN 2-12 grossly intact, moves all extremities in coordinated fashion, sensation  intact   Radiological Exams on Admission: Independently reviewed - see discussion in A/P where applicable  CT Head Wo Contrast  Result Date: 01/04/2023 CLINICAL DATA:  Syncopal episode. EXAM: CT HEAD WITHOUT CONTRAST TECHNIQUE: Contiguous axial images were obtained from the base of the skull through the vertex without intravenous contrast. RADIATION DOSE REDUCTION: This exam was performed according to the departmental dose-optimization program which includes automated exposure control, adjustment of the mA and/or kV according to patient size and/or use of iterative reconstruction technique. COMPARISON:  None Available. FINDINGS: Brain: There is mild cerebral atrophy with  widening of the extra-axial spaces and ventricular dilatation. There are areas of decreased attenuation within the white matter tracts of the supratentorial brain, consistent with microvascular disease changes. Vascular: No hyperdense vessel or unexpected calcification. Skull: Normal. Negative for fracture or focal lesion. Sinuses/Orbits: No acute finding. Other: None. IMPRESSION: 1. Generalized cerebral atrophy with chronic white matter small vessel ischemic changes. 2. No acute intracranial abnormality. Electronically Signed   By: Aram Candela M.D.   On: 01/04/2023 15:47    EKG: Independently reviewed.  NSR with rate 77; nonspecific ST changes with no evidence of acute ischemia. LAFB    Labs on Admission: I have personally reviewed the available labs and imaging studies at the time of the admission.  Pertinent labs:   wbc: 12.4,  potassium: 3.0,  BUN: 45,  creatinine: 1.74,  troponin wnl x 2  Assessment and Plan: Principal Problem:   Orthostatic hypotension Active Problems:   Acute renal failure superimposed on stage 3b chronic kidney disease (HCC)   Leukocytosis   Hypokalemia   Hypertension   Type 2 diabetes mellitus with complication, with long-term current use of insulin (HCC)   Hyperlipidemia   Depression    Hypothyroid    Assessment and Plan: * Orthostatic hypotension 72 year old female presenting for 5-6 day history of dizziness upon standing, weakness with history of one syncopal episode seen in ED on 8/29 found to have orthostatic hypotension  -obs to telemetry -orthostatic vitals impressively positive. Repeat daily  -stop jardiance. Hold atenolol, TED hose  -continue IVF  -CT head negative with no neuro deficits. Dizziness only with standing  -troponin wnl x 2  -has had poor PO intake  -check TSH/B12 -PT to eval   Acute renal failure superimposed on stage 3b chronic kidney disease (HCC) Baseline creatinine appears to be around 1.1-1.4 Hx of poor PO intake, orthostatic hypotension and now creatinine of 1.74 Likely pre pre renal due to aforementioned issues UA with no hgb Received 2L IVF in Ed, continue gentle IVF Hold BP medication, especially jardiance Strict I/O Avoid nephrotoxic drugs Trend   Leukocytosis Appears to be chronic dating back to at least 9 years ago Baseline 13-14, stable  F/u with oncology outpatient if not worked up in past   Hypokalemia Check magnesium Repleted in Ed Trend   Type 2 diabetes mellitus with complication, with long-term current use of insulin (HCC) June A1C is 7.1  Blood sugar low on arrival with poor PO intake Hold 70/30 Sensitive SSI with accuchecks qac/hs  Holding jardiance    Hypertension Hold atenolol for now Compression hose Trend   Hyperlipidemia Continue crestor   Depression Continue paxil 40mg  daily   Hypothyroid Check Tsh Continue home synthroid     Advance Care Planning:   Code Status: Full Code   Consults: PT   DVT Prophylaxis: lovenox   Family Communication: updated her son by phone   Severity of Illness: The appropriate patient status for this patient is OBSERVATION. Observation status is judged to be reasonable and necessary in order to provide the required intensity of service to ensure the  patient's safety. The patient's presenting symptoms, physical exam findings, and initial radiographic and laboratory data in the context of their medical condition is felt to place them at decreased risk for further clinical deterioration. Furthermore, it is anticipated that the patient will be medically stable for discharge from the hospital within 2 midnights of admission.   Author: Orland Mustard, MD 01/04/2023 10:27 PM  For on call review [email or web address].

## 2023-01-05 DIAGNOSIS — M159 Polyosteoarthritis, unspecified: Secondary | ICD-10-CM | POA: Diagnosis present

## 2023-01-05 DIAGNOSIS — E039 Hypothyroidism, unspecified: Secondary | ICD-10-CM | POA: Diagnosis present

## 2023-01-05 DIAGNOSIS — R2681 Unsteadiness on feet: Secondary | ICD-10-CM | POA: Diagnosis present

## 2023-01-05 DIAGNOSIS — Z7984 Long term (current) use of oral hypoglycemic drugs: Secondary | ICD-10-CM | POA: Diagnosis not present

## 2023-01-05 DIAGNOSIS — Z801 Family history of malignant neoplasm of trachea, bronchus and lung: Secondary | ICD-10-CM | POA: Diagnosis not present

## 2023-01-05 DIAGNOSIS — E11649 Type 2 diabetes mellitus with hypoglycemia without coma: Secondary | ICD-10-CM | POA: Diagnosis present

## 2023-01-05 DIAGNOSIS — Z806 Family history of leukemia: Secondary | ICD-10-CM | POA: Diagnosis not present

## 2023-01-05 DIAGNOSIS — E876 Hypokalemia: Secondary | ICD-10-CM | POA: Diagnosis present

## 2023-01-05 DIAGNOSIS — N1832 Chronic kidney disease, stage 3b: Secondary | ICD-10-CM | POA: Diagnosis present

## 2023-01-05 DIAGNOSIS — I444 Left anterior fascicular block: Secondary | ICD-10-CM | POA: Diagnosis present

## 2023-01-05 DIAGNOSIS — Z79899 Other long term (current) drug therapy: Secondary | ICD-10-CM | POA: Diagnosis not present

## 2023-01-05 DIAGNOSIS — R55 Syncope and collapse: Secondary | ICD-10-CM | POA: Diagnosis present

## 2023-01-05 DIAGNOSIS — Z79890 Hormone replacement therapy (postmenopausal): Secondary | ICD-10-CM | POA: Diagnosis not present

## 2023-01-05 DIAGNOSIS — I951 Orthostatic hypotension: Secondary | ICD-10-CM | POA: Diagnosis not present

## 2023-01-05 DIAGNOSIS — Z794 Long term (current) use of insulin: Secondary | ICD-10-CM | POA: Diagnosis not present

## 2023-01-05 DIAGNOSIS — E1122 Type 2 diabetes mellitus with diabetic chronic kidney disease: Secondary | ICD-10-CM | POA: Diagnosis present

## 2023-01-05 DIAGNOSIS — F32A Depression, unspecified: Secondary | ICD-10-CM | POA: Diagnosis present

## 2023-01-05 DIAGNOSIS — Z9049 Acquired absence of other specified parts of digestive tract: Secondary | ICD-10-CM | POA: Diagnosis not present

## 2023-01-05 DIAGNOSIS — E860 Dehydration: Secondary | ICD-10-CM | POA: Diagnosis present

## 2023-01-05 DIAGNOSIS — D72829 Elevated white blood cell count, unspecified: Secondary | ICD-10-CM | POA: Diagnosis present

## 2023-01-05 DIAGNOSIS — I129 Hypertensive chronic kidney disease with stage 1 through stage 4 chronic kidney disease, or unspecified chronic kidney disease: Secondary | ICD-10-CM | POA: Diagnosis present

## 2023-01-05 DIAGNOSIS — E785 Hyperlipidemia, unspecified: Secondary | ICD-10-CM | POA: Diagnosis present

## 2023-01-05 DIAGNOSIS — Z7985 Long-term (current) use of injectable non-insulin antidiabetic drugs: Secondary | ICD-10-CM | POA: Diagnosis not present

## 2023-01-05 DIAGNOSIS — N179 Acute kidney failure, unspecified: Secondary | ICD-10-CM | POA: Diagnosis present

## 2023-01-05 LAB — CBC
HCT: 43.1 % (ref 36.0–46.0)
Hemoglobin: 14 g/dL (ref 12.0–15.0)
MCH: 31.2 pg (ref 26.0–34.0)
MCHC: 32.5 g/dL (ref 30.0–36.0)
MCV: 96 fL (ref 80.0–100.0)
Platelets: 200 10*3/uL (ref 150–400)
RBC: 4.49 MIL/uL (ref 3.87–5.11)
RDW: 12.3 % (ref 11.5–15.5)
WBC: 10.2 10*3/uL (ref 4.0–10.5)
nRBC: 0 % (ref 0.0–0.2)

## 2023-01-05 LAB — HIV ANTIBODY (ROUTINE TESTING W REFLEX): HIV Screen 4th Generation wRfx: NONREACTIVE

## 2023-01-05 LAB — BASIC METABOLIC PANEL
Anion gap: 8 (ref 5–15)
BUN: 35 mg/dL — ABNORMAL HIGH (ref 8–23)
CO2: 27 mmol/L (ref 22–32)
Calcium: 8.5 mg/dL — ABNORMAL LOW (ref 8.9–10.3)
Chloride: 106 mmol/L (ref 98–111)
Creatinine, Ser: 1.34 mg/dL — ABNORMAL HIGH (ref 0.44–1.00)
GFR, Estimated: 43 mL/min — ABNORMAL LOW (ref >60)
Glucose, Bld: 112 mg/dL — ABNORMAL HIGH (ref 70–99)
Potassium: 3.5 mmol/L (ref 3.5–5.1)
Sodium: 141 mmol/L (ref 135–145)

## 2023-01-05 LAB — GLUCOSE, CAPILLARY
Glucose-Capillary: 129 mg/dL — ABNORMAL HIGH (ref 70–99)
Glucose-Capillary: 176 mg/dL — ABNORMAL HIGH (ref 70–99)
Glucose-Capillary: 195 mg/dL — ABNORMAL HIGH (ref 70–99)
Glucose-Capillary: 178 mg/dL — ABNORMAL HIGH (ref 70–99)

## 2023-01-05 MED ORDER — ENOXAPARIN SODIUM 40 MG/0.4ML IJ SOSY
40.0000 mg | PREFILLED_SYRINGE | INTRAMUSCULAR | Status: DC
  Administered 2023-01-05: 40 mg via SUBCUTANEOUS
  Filled 2023-01-05: qty 0.4

## 2023-01-05 NOTE — Progress Notes (Signed)
PROGRESS NOTE    Jennifer Ingram  ZOX:[id number] DOB: 01-Apr-1951 DOA: 01/04/2023 PCP: Rudene Christians, DO   Brief Narrative: 72 year old female with history of type 2 diabetes hypertension hyperlipidemia hypothyroidism and depression admitted with syncope and dizziness.  Patient reports she is dizzy when standing up with decreased p.o. intake had associated nausea vomiting a week ago.  She reports less appetite does not feel like eating anything feels nauseous may have lost some weight.  She came to the ER end of last month with nausea vomiting fall and syncope.  She was discharged home from the ER.  Her last episode of vomiting was on 12/28/2022.  She denies vertigo visual changes headaches hitting her head local weakness. She was orthostatic on admission and she remains orthostatic.   Assessment & Plan:   Principal Problem:   Orthostatic hypotension Active Problems:   Acute renal failure superimposed on stage 3b chronic kidney disease (HCC)   Leukocytosis   Hypokalemia   Hypertension   Type 2 diabetes mellitus with complication, with long-term current use of insulin (HCC)   Hyperlipidemia   Depression   Hypothyroid   #1 orthostatic hypotension admitted with dizziness was found to be clearly orthostatic.  Her p.o. intake has been poor 4 weeks prior to admission.  Continue to hold atenolol, continue normal saline with TED hose.   CT of the head no acute findings noted on admission.  Cardiac enzymes normal.  Physical therapy evaluation pending.  #2 AKI on CKD stage IIIb due to hypotension improving with IV fluids continue to hold Jardiance and atenolol. Creatinine 1.34 from 1.74  #3  Type 2 diabetes on insulin her A1c was 7.1 in June 2024. CBG (last 3)  Recent Labs    01/04/23 2300 01/05/23 0750 01/05/23 1207  GLUCAP 102* 129* 176*   continue SSI  home 70/30 on hold   #4 Hypertension now low and orthostatic Hold atenolol   # 5 Hyperlipidemia Continue crestor    #6  Depression Continue paxil 40mg  daily    #7 Hypothyroid Continue synthroid Tsh normal   #8 Leukocytosis resolved no evidence of infection.  Estimated body mass index is 27.21 kg/m as calculated from the following:   Height as of this encounter: 5' (1.524 m).   Weight as of this encounter: 63.2 kg.  DVT prophylaxis: lovenox Code Status: full Family Communication:none Disposition Plan:  Status is: Inpatient Consultants:  None Procedures: None  Antimicrobials:  None Subjective: Denies chest pain sob nausea vomiting Still lightheaded  Objective: Vitals:   01/05/23 0355 01/05/23 0717 01/05/23 0820 01/05/23 1249  BP:   120/63 139/74  Pulse: 76  75 75  Resp:  20 16 16   Temp: 97.7 F (36.5 C)  98 F (36.7 C) 97.7 F (36.5 C)  TempSrc: Oral   Oral  SpO2:   100% 98%  Weight: 63.2 kg     Height:        Intake/Output Summary (Last 24 hours) at 01/05/2023 1324 Last data filed at 01/05/2023 1031 Gross per 24 hour  Intake 1288.14 ml  Output 350 ml  Net 938.14 ml   Filed Weights   01/04/23 1947 01/05/23 0355  Weight: 62.2 kg 63.2 kg    Examination:  General exam: Appears  in nad  Respiratory system: Clear to auscultation. Respiratory effort normal. Cardiovascular system: S1 & S2 heard, RRR. No JVD, murmurs, rubs, gallops or clicks. No pedal edema. Gastrointestinal system: Abdomen is nondistended, soft and nontender. No organomegaly or masses felt. Normal  bowel sounds heard. Central nervous system: Alert and oriented. No focal neurological deficits. Extremities: no edema   Data Reviewed: I have personally reviewed following labs and imaging studies  CBC: Recent Labs  Lab 01/04/23 1314 01/05/23 0501  WBC 12.4* 10.2  NEUTROABS 7.5  --   HGB 14.7 14.0  HCT 45.0 43.1  MCV 94.9 96.0  PLT 225 200   Basic Metabolic Panel: Recent Labs  Lab 01/04/23 1314 01/04/23 1751 01/05/23 0501  NA 141  --  141  K 3.0*  --  3.5  CL 102  --  106  CO2 28  --  27  GLUCOSE 69*   --  112*  BUN 45*  --  35*  CREATININE 1.74*  --  1.34*  CALCIUM 9.2  --  8.5*  MG  --  2.0  --    GFR: Estimated Creatinine Clearance: 32.4 mL/min (A) (by C-G formula based on SCr of 1.34 mg/dL (H)). Liver Function Tests: Recent Labs  Lab 01/04/23 1314  AST 20  ALT 14  ALKPHOS 68  BILITOT 0.3  PROT 7.3  ALBUMIN 3.8   No results for input(s): "LIPASE", "AMYLASE" in the last 168 hours. No results for input(s): "AMMONIA" in the last 168 hours. Coagulation Profile: No results for input(s): "INR", "PROTIME" in the last 168 hours. Cardiac Enzymes: No results for input(s): "CKTOTAL", "CKMB", "CKMBINDEX", "TROPONINI" in the last 168 hours. BNP (last 3 results) No results for input(s): "PROBNP" in the last 8760 hours. HbA1C: No results for input(s): "HGBA1C" in the last 72 hours. CBG: Recent Labs  Lab 01/04/23 1538 01/04/23 1614 01/04/23 2300 01/05/23 0750 01/05/23 1207  GLUCAP 82 82 102* 129* 176*   Lipid Profile: No results for input(s): "CHOL", "HDL", "LDLCALC", "TRIG", "CHOLHDL", "LDLDIRECT" in the last 72 hours. Thyroid Function Tests: Recent Labs    01/04/23 1938  TSH 3.760   Anemia Panel: Recent Labs    01/04/23 1938  VITAMINB12 868   Sepsis Labs: No results for input(s): "PROCALCITON", "LATICACIDVEN" in the last 168 hours.  Recent Results (from the past 240 hour(s))  Resp panel by RT-PCR (RSV, Flu A&B, Covid) Anterior Nasal Swab     Status: None   Collection Time: 12/28/22  2:11 PM   Specimen: Anterior Nasal Swab  Result Value Ref Range Status   SARS Coronavirus 2 by RT PCR NEGATIVE NEGATIVE Final    Comment: (NOTE) SARS-CoV-2 target nucleic acids are NOT DETECTED.  The SARS-CoV-2 RNA is generally detectable in upper respiratory specimens during the acute phase of infection. The lowest concentration of SARS-CoV-2 viral copies this assay can detect is 138 copies/mL. A negative result does not preclude SARS-Cov-2 infection and should not be used as  the sole basis for treatment or other patient management decisions. A negative result may occur with  improper specimen collection/handling, submission of specimen other than nasopharyngeal swab, presence of viral mutation(s) within the areas targeted by this assay, and inadequate number of viral copies(<138 copies/mL). A negative result must be combined with clinical observations, patient history, and epidemiological information. The expected result is Negative.  Fact Sheet for Patients:  BloggerCourse.com  Fact Sheet for Healthcare Providers:  SeriousBroker.it  This test is no t yet approved or cleared by the [place] FDA and  has been authorized for detection and/or diagnosis of SARS-CoV-2 by FDA under an Emergency Use Authorization (EUA). This EUA will remain  in effect (meaning this test can be used) for the duration of the COVID-19 declaration under  Section 564(b)(1) of the Act, 21 U.S.C.section 360bbb-3(b)(1), unless the authorization is terminated  or revoked sooner.       Influenza A by PCR NEGATIVE NEGATIVE Final   Influenza B by PCR NEGATIVE NEGATIVE Final    Comment: (NOTE) The Xpert Xpress SARS-CoV-2/FLU/RSV plus assay is intended as an aid in the diagnosis of influenza from Nasopharyngeal swab specimens and should not be used as a sole basis for treatment. Nasal washings and aspirates are unacceptable for Xpert Xpress SARS-CoV-2/FLU/RSV testing.  Fact Sheet for Patients: BloggerCourse.com  Fact Sheet for Healthcare Providers: SeriousBroker.it  This test is not yet approved or cleared by the [place] FDA and has been authorized for detection and/or diagnosis of SARS-CoV-2 by FDA under an Emergency Use Authorization (EUA). This EUA will remain in effect (meaning this test can be used) for the duration of the COVID-19 declaration under Section 564(b)(1) of  the Act, 21 U.S.C. section 360bbb-3(b)(1), unless the authorization is terminated or revoked.     Resp Syncytial Virus by PCR NEGATIVE NEGATIVE Final    Comment: (NOTE) Fact Sheet for Patients: BloggerCourse.com  Fact Sheet for Healthcare Providers: SeriousBroker.it  This test is not yet approved or cleared by the [place] FDA and has been authorized for detection and/or diagnosis of SARS-CoV-2 by FDA under an Emergency Use Authorization (EUA). This EUA will remain in effect (meaning this test can be used) for the duration of the COVID-19 declaration under Section 564(b)(1) of the Act, 21 U.S.C. section 360bbb-3(b)(1), unless the authorization is terminated or revoked.  Performed at [facility], [address] [place], [place] [address]          Radiology Studies: CT Head Wo Contrast  Result Date: 01/04/2023 CLINICAL DATA:  Syncopal episode. EXAM: CT HEAD WITHOUT CONTRAST TECHNIQUE: Contiguous axial images were obtained from the base of the skull through the vertex without intravenous contrast. RADIATION DOSE REDUCTION: This exam was performed according to the departmental dose-optimization program which includes automated exposure control, adjustment of the mA and/or kV according to patient size and/or use of iterative reconstruction technique. COMPARISON:  None Available. FINDINGS: Brain: There is mild cerebral atrophy with widening of the extra-axial spaces and ventricular dilatation. There are areas of decreased attenuation within the white matter tracts of the supratentorial brain, consistent with microvascular disease changes. Vascular: No hyperdense vessel or unexpected calcification. Skull: Normal. Negative for fracture or focal lesion. Sinuses/Orbits: No acute finding. Other: None. IMPRESSION: 1. Generalized cerebral atrophy with chronic white matter small vessel ischemic changes. 2. No acute  intracranial abnormality. Electronically Signed   By: Aram Candela M.D.   On: 01/04/2023 15:47        Scheduled Meds:  enoxaparin (LOVENOX) injection  40 mg Subcutaneous Q24H   gabapentin  300 mg Oral BID   insulin aspart  0-9 Units Subcutaneous TID WC   levothyroxine  50 mcg Oral Q0600   PARoxetine  40 mg Oral Daily   rosuvastatin  20 mg Oral Daily   sodium chloride flush  3 mL Intravenous Q12H   Continuous Infusions:  sodium chloride 100 mL/hr at 01/05/23 0515     LOS: 0 days    Time spent: 39 min  Alwyn Ren, MD  01/05/2023, 1:24 PM

## 2023-01-05 NOTE — Evaluation (Signed)
Physical Therapy Evaluation Patient Details Name: Jennifer Ingram MRN: [id number] DOB: October 29, 1950 Today's Date: 01/05/2023  History of Present Illness  Jennifer Ingram is a 72 y.o. female who presented to ED with complaints of dizziness and syncope; admitted with orthostatic hypotension.  PMH: T2DM, HLD, HTN, hypothyroidism, depression  Clinical Impression  Pt admitted with above diagnosis. Pt from home, ind without AD at baseline, reports most recent fall is the only fall. Pt reports "a little" dizziness with positional changes, BP noted below. Pt able to take a few steps at bedside without AD, reaching for furniture, noted positive orthostatic hypotension so further ambulation deferred. Anticipate no f/u at discharge; will continue to assess DME needs. Pt currently with functional limitations due to the deficits listed below (see PT Problem List). Pt will benefit from acute skilled PT to increase their independence and safety with mobility to allow discharge.    BP and HR during eval: Supine139/74 HR 75 Sitting on BSC 111/71 HR 78 Standing 3 min 93/46 HR 92 Return to supine 135/76 HR 76         If plan is discharge home, recommend the following: Assistance with cooking/housework;Assist for transportation   Can travel by private vehicle        Equipment Recommendations Other (comment) (possibly RW pending gait)  Recommendations for Other Services       Functional Status Assessment Patient has had a recent decline in their functional status and demonstrates the ability to make significant improvements in function in a reasonable and predictable amount of time.     Precautions / Restrictions Precautions Precautions: Fall;Other (comment) Precaution Comments: orthostatic hypotension Restrictions Weight Bearing Restrictions: No      Mobility  Bed Mobility Overal bed mobility: Modified Independent                  Transfers Overall transfer level: Needs  assistance Equipment used: None Transfers: Sit to/from Stand, Bed to chair/wheelchair/BSC Sit to Stand: Supervision   Step pivot transfers: Supervision       General transfer comment: BUE assisting to power up to standing, supv to pivot to [facility] at bedside, pt able to take steps at bedside without LOB, reaching for furniture with BUE, reports "a little" dizziness    Ambulation/Gait               General Gait Details: not attempted due to positive orthostatic hypotension wtih dizziness in standing  Stairs            Wheelchair Mobility     Tilt Bed    Modified Rankin (Stroke Patients Only)       Balance Overall balance assessment: Needs assistance Sitting-balance support: Feet supported Sitting balance-Leahy Scale: Good     Standing balance support: During functional activity Standing balance-Leahy Scale: Fair Standing balance comment: static and limited steps at bedside without AD and CGA                             Pertinent Vitals/Pain      Home Living Family/patient expects to be discharged to:: Private residence Living Arrangements: Children Available Help at Discharge: Family;Available PRN/intermittently Type of Home: Mobile home Home Access: Stairs to enter Entrance Stairs-Rails: Right;Left Entrance Stairs-Number of Steps: 4   Home Layout: One level Home Equipment: Cane - single point      Prior Function Prior Level of Function : Independent/Modified Independent  Mobility Comments: pt reports ind with community amb, reports 1 fall (this admission) ADLs Comments: pt reports ind with ADLs/IADLs     Extremity/Trunk Assessment   Upper Extremity Assessment Upper Extremity Assessment: Overall WFL for tasks assessed    Lower Extremity Assessment Lower Extremity Assessment: Overall WFL for tasks assessed (pt reports peripheral neuropathy in bil feet)    Cervical / Trunk Assessment Cervical / Trunk Assessment:  Normal  Communication   Communication Communication: No apparent difficulties  Cognition Arousal: Alert Behavior During Therapy: WFL for tasks assessed/performed Overall Cognitive Status: Within Functional Limits for tasks assessed                                          General Comments      Exercises General Exercises - Lower Extremity Ankle Circles/Pumps: AROM, Both, 10 reps, Supine Quad Sets: AROM, Strengthening, Both, 10 reps, Supine Heel Slides: AROM, Both, 10 reps, Supine Straight Leg Raises: AROM, Strengthening, Both, 10 reps, Supine   Assessment/Plan    PT Assessment Patient needs continued PT services  PT Problem List Decreased activity tolerance;Cardiopulmonary status limiting activity;Decreased knowledge of use of DME       PT Treatment Interventions DME instruction;Gait training;Stair training;Functional mobility training;Therapeutic activities;Therapeutic exercise;Patient/family education    PT Goals (Current goals can be found in the Care Plan section)  Acute Rehab PT Goals Patient Stated Goal: return home with son support PT Goal Formulation: With patient Time For Goal Achievement: 01/19/23 Potential to Achieve Goals: Good    Frequency Min 1X/week     Co-evaluation               AM-PAC PT "6 Clicks" Mobility  Outcome Measure Help needed turning from your back to your side while in a flat bed without using bedrails?: None Help needed moving from lying on your back to sitting on the side of a flat bed without using bedrails?: None Help needed moving to and from a bed to a chair (including a wheelchair)?: A Little Help needed standing up from a chair using your arms (e.g., wheelchair or bedside chair)?: A Little Help needed to walk in hospital room?: A Little Help needed climbing 3-5 steps with a railing? : A Little 6 Click Score: 20    End of Session   Activity Tolerance: Patient tolerated treatment well Patient left: in  bed;with call bell/phone within reach;with bed alarm set Nurse Communication: Mobility status;Other (comment) (BP) PT Visit Diagnosis: Other abnormalities of gait and mobility (R26.89);Difficulty in walking, not elsewhere classified (R26.2)    Time: 1610-9604 PT Time Calculation (min) (ACUTE ONLY): 31 min   Charges:   PT Evaluation $PT Eval Low Complexity: 1 Low PT Treatments $Therapeutic Exercise: 8-22 mins PT General Charges $$ ACUTE PT VISIT: 1 Visit         Tori Asif Much[place] PT, DPT 01/05/23, 1:26 PM

## 2023-01-05 NOTE — Plan of Care (Signed)

## 2023-01-05 NOTE — Plan of Care (Signed)
  Problem: Education: Goal: Knowledge of General Education information will improve Description: Including pain rating scale, medication(s)/side effects and non-pharmacologic comfort measures Outcome: Progressing   Problem: Fluid Volume: Goal: Ability to maintain a balanced intake and output will improve Outcome: Progressing

## 2023-01-06 ENCOUNTER — Inpatient Hospital Stay (HOSPITAL_COMMUNITY): Payer: 59

## 2023-01-06 DIAGNOSIS — I951 Orthostatic hypotension: Secondary | ICD-10-CM | POA: Diagnosis not present

## 2023-01-06 LAB — GLUCOSE, CAPILLARY
Glucose-Capillary: 176 mg/dL — ABNORMAL HIGH (ref 70–99)
Glucose-Capillary: 153 mg/dL — ABNORMAL HIGH (ref 70–99)

## 2023-01-06 MED ORDER — HUMULIN 70/30 KWIKPEN (70-30) 100 UNIT/ML ~~LOC~~ SUPN: PEN_INJECTOR | SUBCUTANEOUS | 11 refills | Status: DC

## 2023-01-06 MED ORDER — ONDANSETRON HCL 4 MG PO TABS: 4.0000 mg | ORAL_TABLET | Freq: Four times a day (QID) | ORAL | 0 refills | Status: DC | PRN

## 2023-01-06 MED ORDER — GABAPENTIN 300 MG PO CAPS: 300.0000 mg | ORAL_CAPSULE | Freq: Every day | ORAL | 0 refills | Status: DC

## 2023-01-06 MED ORDER — ALUM & MAG HYDROXIDE-SIMETH 200-200-20 MG/5ML PO SUSP
30.0000 mL | ORAL | Status: DC | PRN
  Administered 2023-01-06: 30 mL via ORAL
  Filled 2023-01-06: qty 30

## 2023-01-06 NOTE — Plan of Care (Signed)
  Problem: Education: Goal: Knowledge of condition and prescribed therapy will improve Outcome: Progressing   Problem: Nutrition: Goal: Adequate nutrition will be maintained Outcome: Progressing   Problem: Coping: Goal: Level of anxiety will decrease Outcome: Progressing   Problem: Safety: Goal: Ability to remain free from injury will improve Outcome: Progressing

## 2023-01-06 NOTE — Discharge Summary (Signed)
Physician Discharge Summary  KAYLANIE WAUGH ZOX:[id number] DOB: 1950-10-03 DOA: 01/04/2023  PCP: Rudene Christians, DO  Admit date: 01/04/2023 Discharge date: 01/06/2023  Admitted From: HOME Disposition:  HOME  Recommendations for Outpatient Follow-up:  Follow up with PCP in 1-2 weeks Please obtain BMP/CBC in one week PCP please note upon discharge we stopped Tenormin semaglutide and Jardiance.  Decrease the dose of 70/30 insulin.  Tenormin may need to be restarted with as an outpatient at some point.  Home Health:NONE Equipment/Devices:none Discharge Condition:stable CODE STATUS:full Diet recommendation: cardiac Brief/Interim Summary: 72 year old female with history of type 2 diabetes hypertension hyperlipidemia hypothyroidism and depression admitted with syncope and dizziness.  Patient reports she is dizzy when standing up with decreased p.o. intake had associated nausea vomiting a week ago.  She reports less appetite does not feel like eating anything feels nauseous may have lost some weight.  She came to the ER end of last month with nausea vomiting fall and syncope.  She was discharged home from the ER.  Her last episode of vomiting was on 12/28/2022.  She denies vertigo visual changes headaches hitting her head local weakness. She was orthostatic on admission  Discharge Diagnoses:  Principal Problem:   Orthostatic hypotension Active Problems:   Acute renal failure superimposed on stage 3b chronic kidney disease (HCC)   Leukocytosis   Hypokalemia   Hypertension   Type 2 diabetes mellitus with complication, with long-term current use of insulin (HCC)   Hyperlipidemia   Depression   Hypothyroid   Unsteady gait    #1 orthostatic hypotension admitted with dizziness was found to be clearly orthostatic.  Her p.o. intake has been poor 4 weeks prior to admission.  She was treated with IV fluids.  On the day of discharge she ambulated in the hallway without feeling dizzy.  She had TED hoses on.   At the time of discharge atenolol was stopped.   CT of the head no acute findings noted on admission.  Cardiac enzymes normal.    #2 AKI on CKD stage IIIb due to hypotension resolved with IV fluids.     #3  Type 2 diabetes on insulin her A1c was 7.1 in June 2024.  Her p.o. intake was poor.  She was treated with SSI.  Her home 70/30 insulin semaglutide and Jardiance were on hold during her hospital stay.  Patient's p.o. intake has been so poor at home.  And her CKD has progressed to the point that we needed to cut back on her insulin.  I have discharged her on decreased dose of 7030 10 units in the morning and 6 units at night.  I have stopped Jardiance and semaglutide on the time of discharge.  This is probably contributing to her nausea lack of appetite and weight loss.  CBG (last 3)  Recent Labs (last 2 labs)       Recent Labs    01/04/23 2300 01/05/23 0750 01/05/23 1207  GLUCAP 102* 129* 176*     continue SSI  home 70/30 on hold    #4 Hypertension -atenolol stopped.   # 5 Hyperlipidemia Continue crestor    #6 Depression Continue paxil 40mg  daily    #7 Hypothyroid Continue synthroid Tsh normal    #8 Leukocytosis resolved no evidence of infection.   Estimated body mass index is 26.95 kg/m as calculated from the following:   Height as of this encounter: 5' (1.524 m).   Weight as of this encounter: 62.6 kg.  Discharge Instructions  Discharge Instructions     Diet - low sodium heart healthy   Complete by: As directed    Increase activity slowly   Complete by: As directed       Allergies as of 01/06/2023   No Known Allergies      Medication List     STOP taking these medications    atenolol 50 MG tablet Commonly known as: TENORMIN   Jardiance 10 MG Tabs tablet Generic drug: empagliflozin   Semaglutide (1 MG/DOSE) 4 MG/3ML Sopn       TAKE these medications    Accu-Chek Guide test strip Generic drug: glucose blood Check blood sugar 3 times per day    Accu-Chek Softclix Lancets lancets Check blood sugar 3 times a day   Euthyrox 50 MCG tablet Generic drug: levothyroxine Take 1 tablet (50 mcg total) by mouth daily.   FreeStyle Libre 3 Reader Hardie Pulley USE AS DIRECTED   FreeStyle Libre 3 Sensor Misc Place 1 sensor on the skin every 14 days. Use to check glucose continuously.   gabapentin 300 MG capsule Commonly known as: NEURONTIN Take 1 capsule (300 mg total) by mouth at bedtime. What changed: when to take this   HumuLIN 70/30 KwikPen (70-30) 100 UNIT/ML KwikPen Generic drug: insulin isophane & regular human KwikPen Inject 10 units in the morning and 6 units at night What changed: additional instructions   ondansetron 4 MG tablet Commonly known as: ZOFRAN Take 1 tablet (4 mg total) by mouth every 6 (six) hours as needed for nausea. What changed:  when to take this reasons to take this   PARoxetine 40 MG tablet Commonly known as: PAXIL TAKE 1 TABLET BY MOUTH ONCE DAILY AFTER BREAKFAST   Pen Needles 31G X 5 MM Misc 1 each by Does not apply route daily.   rosuvastatin 20 MG tablet Commonly known as: CRESTOR Take 1 tablet by mouth once daily        No Known Allergies  Consultations: none   Procedures/Studies: CT Head Wo Contrast  Result Date: 01/04/2023 CLINICAL DATA:  Syncopal episode. EXAM: CT HEAD WITHOUT CONTRAST TECHNIQUE: Contiguous axial images were obtained from the base of the skull through the vertex without intravenous contrast. RADIATION DOSE REDUCTION: This exam was performed according to the departmental dose-optimization program which includes automated exposure control, adjustment of the mA and/or kV according to patient size and/or use of iterative reconstruction technique. COMPARISON:  None Available. FINDINGS: Brain: There is mild cerebral atrophy with widening of the extra-axial spaces and ventricular dilatation. There are areas of decreased attenuation within the white matter tracts of the  supratentorial brain, consistent with microvascular disease changes. Vascular: No hyperdense vessel or unexpected calcification. Skull: Normal. Negative for fracture or focal lesion. Sinuses/Orbits: No acute finding. Other: None. IMPRESSION: 1. Generalized cerebral atrophy with chronic white matter small vessel ischemic changes. 2. No acute intracranial abnormality. Electronically Signed   By: Aram Candela M.D.   On: 01/04/2023 15:47   DG Chest Portable 1 View  Result Date: 12/28/2022 CLINICAL DATA:  sepsis.  Heat exposure.  Nausea.  Vomiting. EXAM: PORTABLE CHEST 1 VIEW COMPARISON:  11/06/2016. FINDINGS: Bilateral lung fields are clear. Bilateral lateral costophrenic angles are clear. Normal cardio-mediastinal silhouette. No acute osseous abnormalities. The soft tissues are within normal limits. IMPRESSION: No active disease. Electronically Signed   By: Jules Schick M.D.   On: 12/28/2022 15:55   CT ABDOMEN PELVIS W CONTRAST  Result Date: 12/28/2022 CLINICAL DATA:  Right lower quadrant pain.  Nausea and vomiting EXAM: CT ABDOMEN AND PELVIS WITH CONTRAST TECHNIQUE: Multidetector CT imaging of the abdomen and pelvis was performed using the standard protocol following bolus administration of intravenous contrast. RADIATION DOSE REDUCTION: This exam was performed according to the departmental dose-optimization program which includes automated exposure control, adjustment of the mA and/or kV according to patient size and/or use of iterative reconstruction technique. CONTRAST:  80mL OMNIPAQUE IOHEXOL 300 MG/ML  SOLN COMPARISON:  None Available. FINDINGS: Lower chest: Lung bases are clear. No pleural effusion moderate hiatal hernia. Coronary artery calcifications are seen. Calcifications as well in the area of the aortic valve Hepatobiliary: Fatty liver infiltration. No space-occupying liver lesion. Previous cholecystectomy. Patent portal vein. Pancreas: Unremarkable. No pancreatic ductal dilatation or  surrounding inflammatory changes. Spleen: Normal in size without focal abnormality. Adrenals/Urinary Tract: Adrenal glands are unremarkable. Kidneys are normal, without renal calculi, focal lesion, or hydronephrosis. Bladder is unremarkable. Stomach/Bowel: Moderate colonic stool. There is some high density material within the lumen of the distal colon, nonspecific. Stomach and small bowel are nondilated. Cecum resides in the right midabdomen. Preserved ileocecal valve. The appendix is not well seen. No specific pericecal stranding or fluid. Of note there is a small nodular area of vascular type enhancement along a loop of jejunum seen on coronal series 8 image 63, axial image 28 of series 2 measuring 5 mm of uncertain etiology and significance. A small vascular lesion is not excluded. Vascular/Lymphatic: Choose due Reproductive: Uterus and bilateral adnexa are unremarkable. Other: No free air or free fluid. Protuberance of the anterior abdominal wall with slight rectus muscle diastasis. Small fat containing umbilical hernia. Musculoskeletal: There is a focal area of deformity along the left transverse process L3. This could be the sequela previous trauma or fracture. There is slight adjacent soft tissue thickening. Please correlate with specific history. There is a differential IMPRESSION: Scattered colonic stool. No bowel obstruction, free air or free fluid. Poor visualization of the appendix but no pericecal stranding or fluid. Moderate hiatal hernia. Fatty liver infiltration.  Prior cholecystectomy. Lucency along the left transverse process of L3 with some adjacent soft tissue thickening. This could be the sequela of trauma but does has a differential. Please correlate with any clinical presentation/history. Otherwise further workup when appropriate such as bone scan. 5 mm nodular area of enhancement along a loop of jejunum. Possible small vascular lesion of doubtful significance. Please correlate with any  specific clinical history or symptoms Electronically Signed   By: Karen Kays M.D.   On: 12/28/2022 13:17   (Echo, Carotid, EGD, Colonoscopy, ERCP)    Subjective: Anxious to go home Encouraged her to eat small frequent meals  Discharge Exam: Vitals:   01/06/23 0534 01/06/23 1350  BP: (!) 146/97 132/78  Pulse: 86 85  Resp: 16 17  Temp: (!) 97.4 F (36.3 C) 98 F (36.7 C)  SpO2: 96% 97%   Vitals:   01/05/23 2202 01/06/23 0500 01/06/23 0534 01/06/23 1350  BP: 134/72  (!) 146/97 132/78  Pulse: 78  86 85  Resp:   16 17  Temp: 98 F (36.7 C)  (!) 97.4 F (36.3 C) 98 F (36.7 C)  TempSrc: Oral  Oral Oral  SpO2: 98%  96% 97%  Weight:  62.6 kg    Height:        General: Pt is alert, awake, not in acute distress Cardiovascular: RRR, S1/S2 +, no rubs, no gallops Respiratory: CTA bilaterally, no wheezing, no rhonchi Abdominal: Soft, NT, ND, bowel sounds +  Extremities: no edema, no cyanosis    The results of significant diagnostics from this hospitalization (including imaging, microbiology, ancillary and laboratory) are listed below for reference.     Microbiology: Recent Results (from the past 240 hour(s))  Resp panel by RT-PCR (RSV, Flu A&B, Covid) Anterior Nasal Swab     Status: None   Collection Time: 12/28/22  2:11 PM   Specimen: Anterior Nasal Swab  Result Value Ref Range Status   SARS Coronavirus 2 by RT PCR NEGATIVE NEGATIVE Final    Comment: (NOTE) SARS-CoV-2 target nucleic acids are NOT DETECTED.  The SARS-CoV-2 RNA is generally detectable in upper respiratory specimens during the acute phase of infection. The lowest concentration of SARS-CoV-2 viral copies this assay can detect is 138 copies/mL. A negative result does not preclude SARS-Cov-2 infection and should not be used as the sole basis for treatment or other patient management decisions. A negative result may occur with  improper specimen collection/handling, submission of specimen other than  nasopharyngeal swab, presence of viral mutation(s) within the areas targeted by this assay, and inadequate number of viral copies(<138 copies/mL). A negative result must be combined with clinical observations, patient history, and epidemiological information. The expected result is Negative.  Fact Sheet for Patients:  BloggerCourse.com  Fact Sheet for Healthcare Providers:  SeriousBroker.it  This test is no t yet approved or cleared by the [place] FDA and  has been authorized for detection and/or diagnosis of SARS-CoV-2 by FDA under an Emergency Use Authorization (EUA). This EUA will remain  in effect (meaning this test can be used) for the duration of the COVID-19 declaration under Section 564(b)(1) of the Act, 21 U.S.C.section 360bbb-3(b)(1), unless the authorization is terminated  or revoked sooner.       Influenza A by PCR NEGATIVE NEGATIVE Final   Influenza B by PCR NEGATIVE NEGATIVE Final    Comment: (NOTE) The Xpert Xpress SARS-CoV-2/FLU/RSV plus assay is intended as an aid in the diagnosis of influenza from Nasopharyngeal swab specimens and should not be used as a sole basis for treatment. Nasal washings and aspirates are unacceptable for Xpert Xpress SARS-CoV-2/FLU/RSV testing.  Fact Sheet for Patients: BloggerCourse.com  Fact Sheet for Healthcare Providers: SeriousBroker.it  This test is not yet approved or cleared by the [place] FDA and has been authorized for detection and/or diagnosis of SARS-CoV-2 by FDA under an Emergency Use Authorization (EUA). This EUA will remain in effect (meaning this test can be used) for the duration of the COVID-19 declaration under Section 564(b)(1) of the Act, 21 U.S.C. section 360bbb-3(b)(1), unless the authorization is terminated or revoked.     Resp Syncytial Virus by PCR NEGATIVE NEGATIVE Final    Comment:  (NOTE) Fact Sheet for Patients: BloggerCourse.com  Fact Sheet for Healthcare Providers: SeriousBroker.it  This test is not yet approved or cleared by the [place] FDA and has been authorized for detection and/or diagnosis of SARS-CoV-2 by FDA under an Emergency Use Authorization (EUA). This EUA will remain in effect (meaning this test can be used) for the duration of the COVID-19 declaration under Section 564(b)(1) of the Act, 21 U.S.C. section 360bbb-3(b)(1), unless the authorization is terminated or revoked.  Performed at [facility], [address] [place], [place] [address]      Labs: BNP (last 3 results) No results for input(s): "BNP" in the last 8760 hours. Basic Metabolic Panel: Recent Labs  Lab 01/04/23 1314 01/04/23 1751 01/05/23 0501  NA 141  --  141  K 3.0*  --  3.5  CL 102  --  106  CO2 28  --  27  GLUCOSE 69*  --  112*  BUN 45*  --  35*  CREATININE 1.74*  --  1.34*  CALCIUM 9.2  --  8.5*  MG  --  2.0  --    Liver Function Tests: Recent Labs  Lab 01/04/23 1314  AST 20  ALT 14  ALKPHOS 68  BILITOT 0.3  PROT 7.3  ALBUMIN 3.8   No results for input(s): "LIPASE", "AMYLASE" in the last 168 hours. No results for input(s): "AMMONIA" in the last 168 hours. CBC: Recent Labs  Lab 01/04/23 1314 01/05/23 0501  WBC 12.4* 10.2  NEUTROABS 7.5  --   HGB 14.7 14.0  HCT 45.0 43.1  MCV 94.9 96.0  PLT 225 200   Cardiac Enzymes: No results for input(s): "CKTOTAL", "CKMB", "CKMBINDEX", "TROPONINI" in the last 168 hours. BNP: Invalid input(s): "POCBNP" CBG: Recent Labs  Lab 01/05/23 1207 01/05/23 1702 01/05/23 2147 01/06/23 0758 01/06/23 1131  GLUCAP 176* 195* 178* 176* 153*   D-Dimer No results for input(s): "DDIMER" in the last 72 hours. Hgb A1c No results for input(s): "HGBA1C" in the last 72 hours. Lipid Profile No results for input(s): "CHOL", "HDL", "LDLCALC",  "TRIG", "CHOLHDL", "LDLDIRECT" in the last 72 hours. Thyroid function studies Recent Labs    01/04/23 1938  TSH 3.760   Anemia work up Recent Labs    01/04/23 1938  VITAMINB12 868   Urinalysis    Component Value Date/Time   COLORURINE YELLOW 01/04/2023 1314   APPEARANCEUR HAZY (A) 01/04/2023 1314   LABSPEC 1.010 01/04/2023 1314   PHURINE 5.0 01/04/2023 1314   GLUCOSEU >=500 (A) 01/04/2023 1314   HGBUR NEGATIVE 01/04/2023 1314   BILIRUBINUR NEGATIVE 01/04/2023 1314   KETONESUR NEGATIVE 01/04/2023 1314   PROTEINUR NEGATIVE 01/04/2023 1314   UROBILINOGEN 0.2 11/05/2013 1104   NITRITE NEGATIVE 01/04/2023 1314   LEUKOCYTESUR MODERATE (A) 01/04/2023 1314   Sepsis Labs Recent Labs  Lab 01/04/23 1314 01/05/23 0501  WBC 12.4* 10.2   Microbiology Recent Results (from the past 240 hour(s))  Resp panel by RT-PCR (RSV, Flu A&B, Covid) Anterior Nasal Swab     Status: None   Collection Time: 12/28/22  2:11 PM   Specimen: Anterior Nasal Swab  Result Value Ref Range Status   SARS Coronavirus 2 by RT PCR NEGATIVE NEGATIVE Final    Comment: (NOTE) SARS-CoV-2 target nucleic acids are NOT DETECTED.  The SARS-CoV-2 RNA is generally detectable in upper respiratory specimens during the acute phase of infection. The lowest concentration of SARS-CoV-2 viral copies this assay can detect is 138 copies/mL. A negative result does not preclude SARS-Cov-2 infection and should not be used as the sole basis for treatment or other patient management decisions. A negative result may occur with  improper specimen collection/handling, submission of specimen other than nasopharyngeal swab, presence of viral mutation(s) within the areas targeted by this assay, and inadequate number of viral copies(<138 copies/mL). A negative result must be combined with clinical observations, patient history, and epidemiological information. The expected result is Negative.  Fact Sheet for Patients:   BloggerCourse.com  Fact Sheet for Healthcare Providers:  SeriousBroker.it  This test is no t yet approved or cleared by the [place] FDA and  has been authorized for detection and/or diagnosis of SARS-CoV-2 by FDA under an Emergency Use Authorization (EUA). This EUA will remain  in effect (meaning this test can be  used) for the duration of the COVID-19 declaration under Section 564(b)(1) of the Act, 21 U.S.C.section 360bbb-3(b)(1), unless the authorization is terminated  or revoked sooner.       Influenza A by PCR NEGATIVE NEGATIVE Final   Influenza B by PCR NEGATIVE NEGATIVE Final    Comment: (NOTE) The Xpert Xpress SARS-CoV-2/FLU/RSV plus assay is intended as an aid in the diagnosis of influenza from Nasopharyngeal swab specimens and should not be used as a sole basis for treatment. Nasal washings and aspirates are unacceptable for Xpert Xpress SARS-CoV-2/FLU/RSV testing.  Fact Sheet for Patients: BloggerCourse.com  Fact Sheet for Healthcare Providers: SeriousBroker.it  This test is not yet approved or cleared by the [place] FDA and has been authorized for detection and/or diagnosis of SARS-CoV-2 by FDA under an Emergency Use Authorization (EUA). This EUA will remain in effect (meaning this test can be used) for the duration of the COVID-19 declaration under Section 564(b)(1) of the Act, 21 U.S.C. section 360bbb-3(b)(1), unless the authorization is terminated or revoked.     Resp Syncytial Virus by PCR NEGATIVE NEGATIVE Final    Comment: (NOTE) Fact Sheet for Patients: BloggerCourse.com  Fact Sheet for Healthcare Providers: SeriousBroker.it  This test is not yet approved or cleared by the [place] FDA and has been authorized for detection and/or diagnosis of SARS-CoV-2 by FDA under an Emergency Use  Authorization (EUA). This EUA will remain in effect (meaning this test can be used) for the duration of the COVID-19 declaration under Section 564(b)(1) of the Act, 21 U.S.C. section 360bbb-3(b)(1), unless the authorization is terminated or revoked.  Performed at [facility], [address] [place], [place] [address]      Time coordinating discharge: 39 minutes  SIGNED: Alwyn Ren, MD  [facility] 01/06/2023, 4:18 PM

## 2023-01-06 NOTE — TOC Initial Note (Signed)
Transition of Care [facility]) - Initial/Assessment Note    Patient Details  Name: Jennifer Ingram MRN: [id number] Date of Birth: 09-11-1950  Transition of [facility]) CM/SW Contact:    Adrian Prows, RN Phone Number: 01/06/2023, 1:26 PM  Clinical Narrative:                 Sherron Monday w/ pt in room; pt says she is from home and plans to return at d/c; she lives w/ son/POC Bertram Gala; pt says she has transportation; she denies IPV, food/housing insecurity and difficulty paying utilities; pt says she has reading glasses, cane, walker, and grab bars in shower; pt says she does not have HH services or home oxygen; no TOC needs.  Expected Discharge Plan: Home/Self Care Barriers to Discharge: No Barriers Identified   Patient Goals and CMS Choice Patient states their goals for this hospitalization and ongoing recovery are:: home          Expected Discharge Plan and Services   Discharge Planning Services: CM Consult   Living arrangements for the past 2 months: Mobile Home Expected Discharge Date: 01/06/23               DME Arranged: N/A DME Agency: NA       HH Arranged: NA HH Agency: NA        Prior Living Arrangements/Services Living arrangements for the past 2 months: Mobile Home Lives with:: Adult Children Patient language and need for interpreter reviewed:: Yes Do you feel safe going back to the place where you live?: Yes      Need for Family Participation in Patient Care: Yes (Comment) Care giver support system in place?: Yes (comment) Current home services: DME (cane, walker) Criminal Activity/Legal Involvement Pertinent to Current Situation/Hospitalization: No - Comment as needed  Activities of Daily Living Home Assistive Devices/Equipment: None ADL Screening (condition at time of admission) Patient's cognitive ability adequate to safely complete daily activities?: Yes Is the patient deaf or have difficulty hearing?: No Does the patient have difficulty seeing,  even when wearing glasses/contacts?: No Does the patient have difficulty concentrating, remembering, or making decisions?: No Patient able to express need for assistance with ADLs?: Yes Does the patient have difficulty dressing or bathing?: Yes Independently performs ADLs?: Yes (appropriate for developmental age) Does the patient have difficulty walking or climbing stairs?: Yes Weakness of Legs: Both Weakness of Arms/Hands: None  Permission Sought/Granted Permission sought to share information with : Case Manager Permission granted to share information with : Yes, Verbal Permission Granted  Share Information with NAME: Case Manager     Permission granted to share info w Relationship: Nalda Fiallos (son) [phone]     Emotional Assessment Appearance:: Appears stated age Attitude/Demeanor/Rapport: Gracious Affect (typically observed): Accepting Orientation: : Oriented to Self, Oriented to Place, Oriented to  Time, Oriented to Situation Alcohol / Substance Use: Not Applicable Psych Involvement: No (comment)  Admission diagnosis:  Syncope and collapse [R55] Hypokalemia [E87.6] Nausea [R11.0] Decreased oral intake [R63.8] Orthostatic dizziness [R42] Unsteady gait [R26.81] Patient Active Problem List   Diagnosis Date Noted   Unsteady gait 01/05/2023   Orthostatic hypotension 01/04/2023   Hypokalemia 01/04/2023   Acute renal failure superimposed on stage 3b chronic kidney disease (HCC) 01/04/2023   Leukocytosis 01/04/2023   Type 2 diabetes mellitus with complication, with long-term current use of insulin (HCC) 01/04/2023   Systolic murmur 10/10/2022   Diminished pulses in lower extremity 11/30/2021   Pain due to onychomycosis of toenails of both feet  02/14/2021   Chronic kidney disease 12/20/2020   Depression 12/17/2020   Diabetes mellitus with polyneuropathy (HCC) 12/17/2020   Heart palpitations 12/17/2020   Hypothyroid 12/17/2020   Hypertension 12/17/2020    Hyperlipidemia 12/17/2020   Healthcare maintenance 12/17/2020   Intertrigo 12/17/2020   PCP:  Rudene Christians, DO Pharmacy:   [facility] [address], [place] - [address] [place] [address] Phone: [phone] Fax: [phone]     Social Determinants of Health (SDOH) Social History: SDOH Screenings   Food Insecurity: No Food Insecurity (01/06/2023)  Housing: Low Risk  (01/06/2023)  Transportation Needs: No Transportation Needs (01/06/2023)  Utilities: Not At Risk (01/06/2023)  Alcohol Screen: Low Risk  (10/10/2022)  Depression (PHQ2-9): Low Risk  (10/10/2022)  Physical Activity: Sufficiently Active (10/10/2022)  Social Connections: Moderately Integrated (10/10/2022)  Stress: No Stress Concern Present (10/10/2022)  Tobacco Use: Low Risk  (01/04/2023)   SDOH Interventions: Food Insecurity Interventions: Intervention Not Indicated, Inpatient TOC Housing Interventions: Intervention Not Indicated, Inpatient TOC Transportation Interventions: Intervention Not Indicated, Inpatient TOC Utilities Interventions: Intervention Not Indicated, Inpatient TOC   Readmission Risk Interventions    01/06/2023    1:24 PM  Readmission Risk Prevention Plan  Transportation Screening Complete  PCP or Specialist Appt within 5-7 Days Complete  Home Care Screening Complete  Medication Review (RN CM) Complete

## 2023-01-06 NOTE — Plan of Care (Signed)
  Problem: Education: Goal: Knowledge of condition and prescribed therapy will improve Outcome: Completed/Met   Problem: Cardiac: Goal: Will achieve and/or maintain adequate cardiac output Outcome: Completed/Met   Problem: Physical Regulation: Goal: Complications related to the disease process, condition or treatment will be avoided or minimized Outcome: Completed/Met   Problem: Education: Goal: Knowledge of General Education information will improve Description: Including pain rating scale, medication(s)/side effects and non-pharmacologic comfort measures Outcome: Completed/Met   Problem: Health Behavior/Discharge Planning: Goal: Ability to manage health-related needs will improve Outcome: Completed/Met   Problem: Clinical Measurements: Goal: Ability to maintain clinical measurements within normal limits will improve Outcome: Completed/Met Goal: Will remain free from infection Outcome: Completed/Met Goal: Diagnostic test results will improve Outcome: Completed/Met Goal: Respiratory complications will improve Outcome: Completed/Met Goal: Cardiovascular complication will be avoided Outcome: Completed/Met   Problem: Activity: Goal: Risk for activity intolerance will decrease Outcome: Completed/Met   Problem: Nutrition: Goal: Adequate nutrition will be maintained Outcome: Completed/Met   Problem: Coping: Goal: Level of anxiety will decrease Outcome: Completed/Met   Problem: Elimination: Goal: Will not experience complications related to bowel motility Outcome: Completed/Met Goal: Will not experience complications related to urinary retention Outcome: Completed/Met   Problem: Pain Managment: Goal: General experience of comfort will improve Outcome: Completed/Met   Problem: Safety: Goal: Ability to remain free from injury will improve Outcome: Completed/Met   Problem: Skin Integrity: Goal: Risk for impaired skin integrity will decrease Outcome: Completed/Met    Problem: Education: Goal: Ability to describe self-care measures that may prevent or decrease complications (Diabetes Survival Skills Education) will improve Outcome: Completed/Met Goal: Individualized Educational Video(s) Outcome: Completed/Met   Problem: Coping: Goal: Ability to adjust to condition or change in health will improve Outcome: Completed/Met   Problem: Fluid Volume: Goal: Ability to maintain a balanced intake and output will improve Outcome: Completed/Met   Problem: Health Behavior/Discharge Planning: Goal: Ability to identify and utilize available resources and services will improve Outcome: Completed/Met Goal: Ability to manage health-related needs will improve Outcome: Completed/Met   Problem: Metabolic: Goal: Ability to maintain appropriate glucose levels will improve Outcome: Completed/Met   Problem: Nutritional: Goal: Maintenance of adequate nutrition will improve Outcome: Completed/Met Goal: Progress toward achieving an optimal weight will improve Outcome: Completed/Met   Problem: Skin Integrity: Goal: Risk for impaired skin integrity will decrease Outcome: Completed/Met   Problem: Tissue Perfusion: Goal: Adequacy of tissue perfusion will improve Outcome: Completed/Met

## 2023-01-06 NOTE — Progress Notes (Signed)
Mobility Specialist - Progress Note   01/06/23 1012  Orthostatic Lying   BP- Lying 133/73  Pulse- Lying 88  Orthostatic Sitting  BP- Sitting 124/80  Pulse- Sitting 93  Orthostatic Standing at 0 minutes  BP- Standing at 0 minutes 107/68  Pulse- Standing at 0 minutes 94  Orthostatic Standing at 3 minutes  BP- Standing at 3 minutes 117/66  Pulse- Standing at 3 minutes 93  Mobility  Activity Ambulated with assistance in hallway  Level of Assistance Standby assist, set-up cues, supervision of patient - no hands on  Assistive Device Front wheel walker  Distance Ambulated (ft) 200 ft  Range of Motion/Exercises Active  Activity Response Tolerated well  Mobility Referral Yes  $Mobility charge 1 Mobility  Mobility Specialist Start Time (ACUTE ONLY) 0950  Mobility Specialist Stop Time (ACUTE ONLY) 1012  Mobility Specialist Time Calculation (min) (ACUTE ONLY) 22 min   Pt was found in bed and agreeable to ambulate after checking orthostatic vitals. No complaints with session. At EOS returned to bed with all needs met. Call bell in reach.  Billey Chang Mobility Specialist

## 2023-01-09 ENCOUNTER — Telehealth: Payer: Self-pay

## 2023-01-09 NOTE — Transitions of Care (Post Inpatient/ED Visit) (Signed)
   01/09/2023  Name: Jennifer Ingram MRN: [id number] DOB: 05/09/1950  Today's TOC FU Call Status: Today's TOC FU Call Status:: Unsuccessful Call (1st Attempt) Unsuccessful Call (1st Attempt) Date: 01/09/23  Attempted to reach the patient regarding the most recent Inpatient/ED visit.  Follow Up Plan: Additional outreach attempts will be made to reach the patient to complete the Transitions of Care (Post Inpatient/ED visit) call.   Jodelle Gross RN, BSN, CCM [facility] RN Care Coordinator/ Transitions of Care Direct Dial: [phone]  Fax: [phone]

## 2023-01-10 ENCOUNTER — Telehealth: Payer: Self-pay

## 2023-01-10 NOTE — Transitions of Care (Post Inpatient/ED Visit) (Signed)
   01/10/2023  Name: [name] MRN: [id number] DOB: [birth year]  Today's TOC FU Call Status: Today's TOC FU Call Status:: Unsuccessful Call (2nd Attempt) Unsuccessful Call (2nd Attempt) Date: 01/10/23  Attempted to reach the patient regarding the most recent Inpatient/ED visit.  Follow Up Plan: Additional outreach attempts will be made to reach the patient to complete the Transitions of Care (Post Inpatient/ED visit) call.   Jodelle Gross RN, BSN, CCM [facility] RN Care Coordinator/ Transitions of Care Direct Dial: [phone]  Fax: [phone]

## 2023-01-11 ENCOUNTER — Telehealth: Payer: Self-pay

## 2023-01-11 NOTE — Transitions of Care (Post Inpatient/ED Visit) (Signed)
   01/11/2023  Name: Jennifer Ingram MRN: [id number] DOB: 20-Oct-1950  Today's TOC FU Call Status: Today's TOC FU Call Status:: Unsuccessful Call (3rd Attempt) Unsuccessful Call (3rd Attempt) Date: 01/11/23  Attempted to reach the patient regarding the most recent Inpatient/ED visit.  Follow Up Plan: No further outreach attempts will be made at this time. We have been unable to contact the patient.  Jodelle Gross RN, BSN, CCM [facility] RN Care Coordinator/ Transitions of Care Direct Dial: [phone]  Fax: [phone]

## 2023-01-16 ENCOUNTER — Encounter: Payer: 59 | Admitting: Student

## 2023-01-23 ENCOUNTER — Telehealth: Payer: Self-pay | Admitting: *Deleted

## 2023-01-23 NOTE — Telephone Encounter (Signed)
Unable to contact patient -vm full / lvm with brother for Jennifer Ingram to call the clinic regarding getting her scheduled for her mammogram. Previous appointment 11-29-22 dnk.

## 2023-02-08 ENCOUNTER — Encounter: Payer: 59 | Admitting: Student

## 2023-04-21 IMAGING — MG MM DIGITAL DIAGNOSTIC UNILAT*R* W/ TOMO W/ CAD
4 series · 4 of 12 positions shown · non-contrast
Comparison: Previous exam(s).
COMPARISON: Previous exam(s).

Addendum:
CLINICAL DATA: The patient was called back for a possible right
breast mass versus focal asymmetry.

EXAM:
DIGITAL DIAGNOSTIC UNILATERAL RIGHT MAMMOGRAM WITH TOMOSYNTHESIS AND
CAD
TECHNIQUE: Right digital diagnostic mammography and breast tomosynthesis was
performed. The images were evaluated with computer-aided detection.

[R MLO synth-2D]
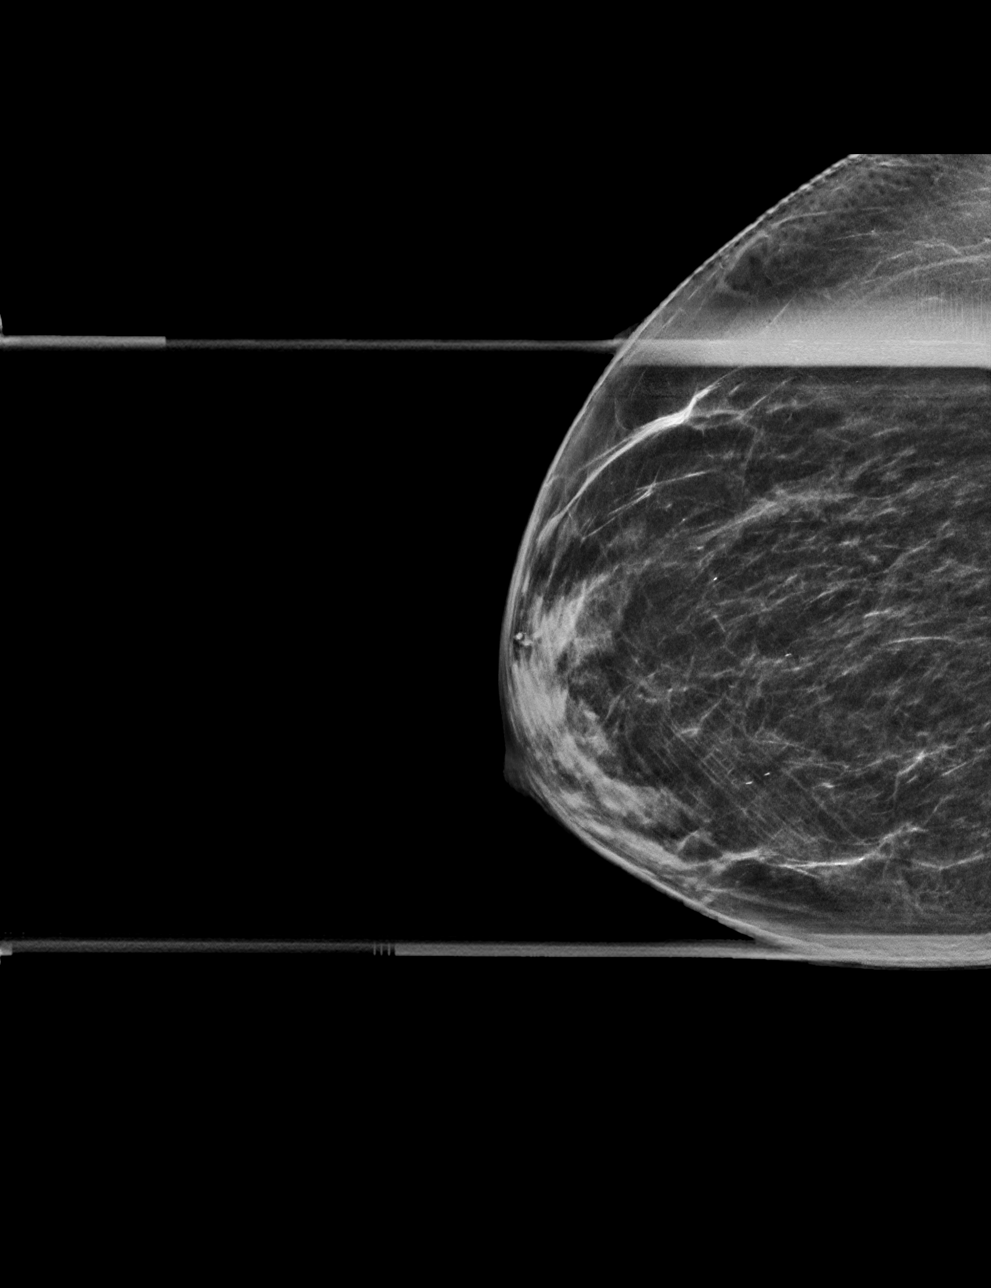

[R CC synth-2D]
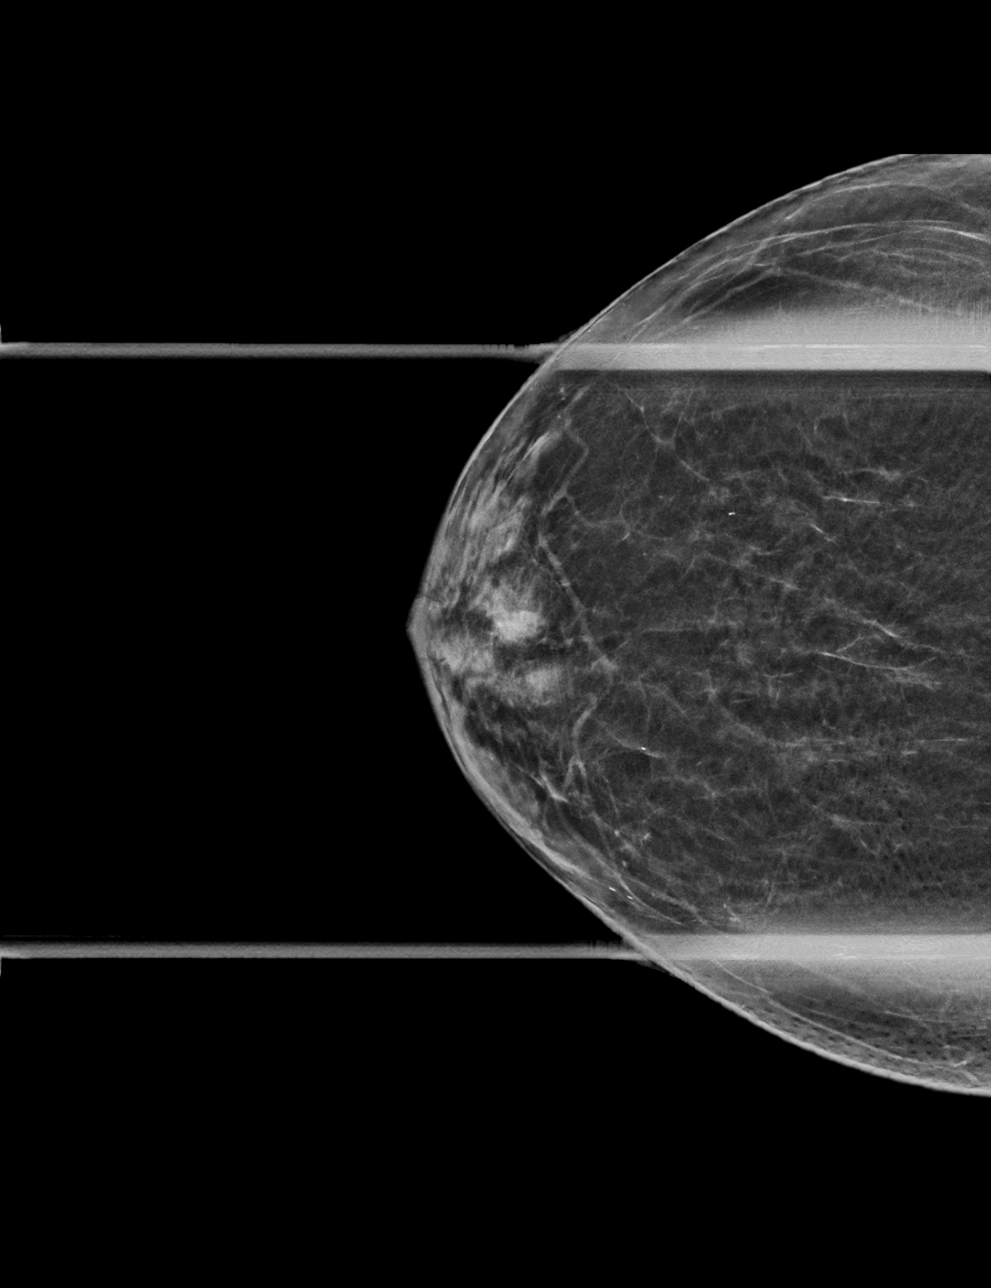

[R CC tomo · tomo slice 24/47.0]
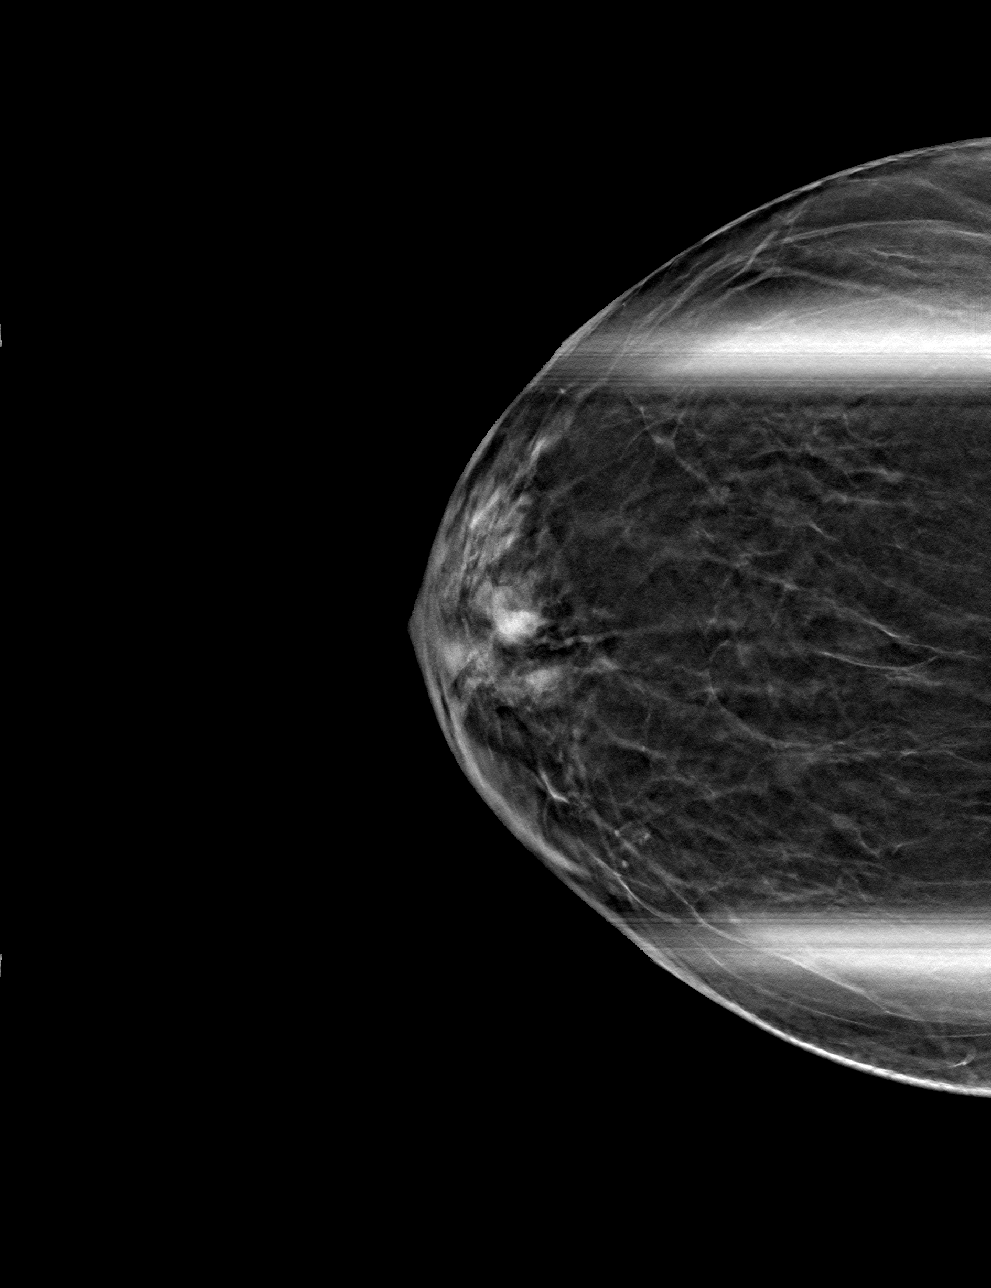

[R MLO tomo · tomo slice 26/51.0]
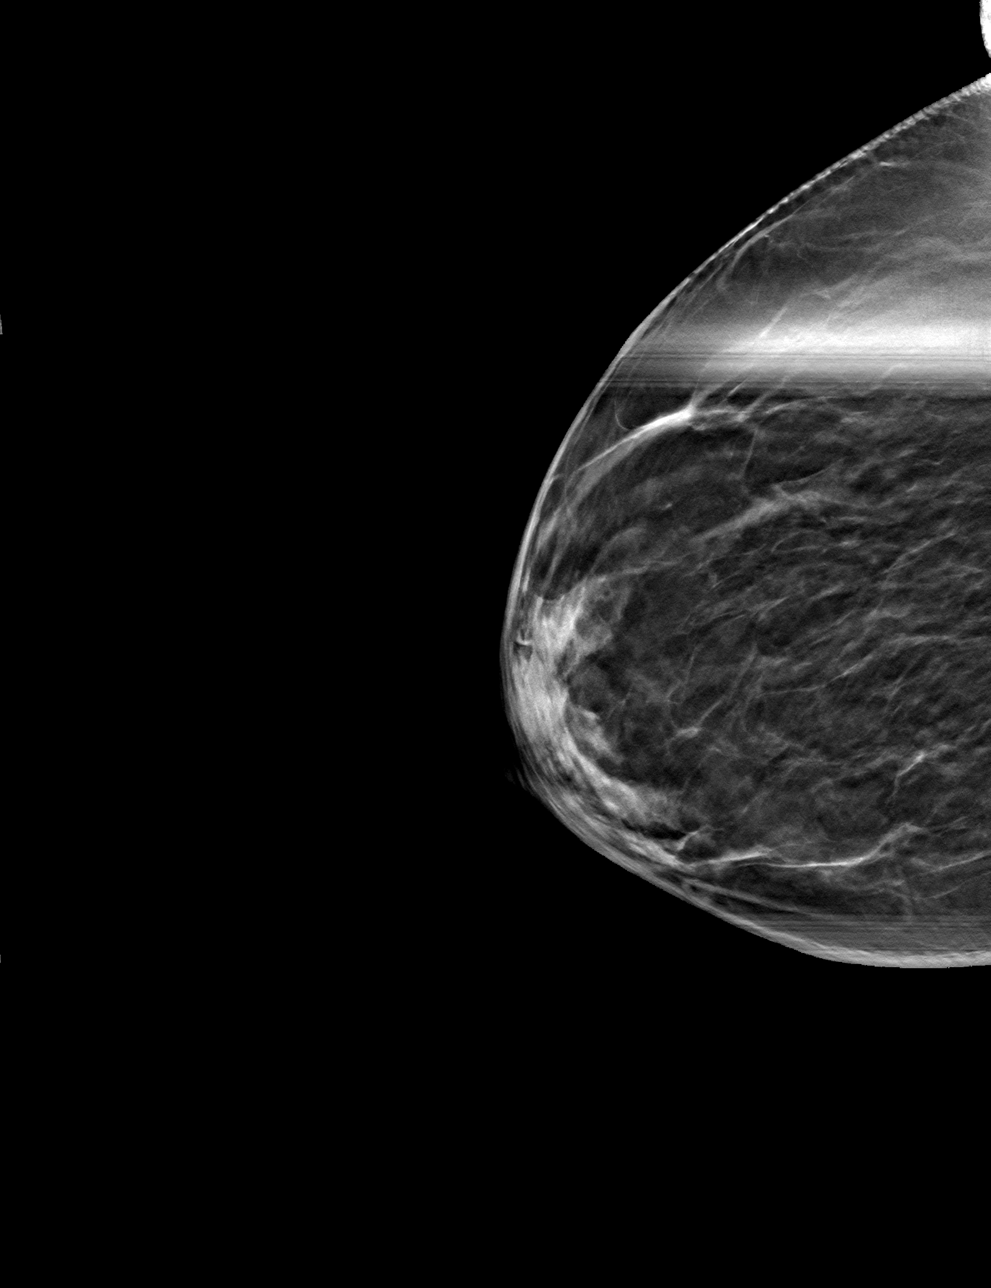

[4 of 12 positions shown; findings below may reference images not displayed]

ACR Breast Density Category b: There are scattered areas of
fibroglandular density.
FINDINGS: The possible right breast mass versus focal asymmetry improves on
today's imaging, likely an island of dense glandular tissue.

On physical exam, no suspicious lumps are identified.

Targeted ultrasound is performed, showing island of dense glandular
tissue in the retroareolar region explaining the mammographic
findings. No evidence of malignancy.
IMPRESSION: No mammographic or sonographic evidence of malignancy.

RECOMMENDATION:
Annual screening mammography.

I have discussed the findings and recommendations with the patient.
If applicable, a reminder letter will be sent to the patient
regarding the next appointment.

BI-RADS CATEGORY  2: Benign.

ADDENDUM:
A right breast ultrasound was also performed.

*** End of Addendum ***
ACR Breast Density Category b: There are scattered areas of
fibroglandular density.
FINDINGS: The possible right breast mass versus focal asymmetry improves on
today's imaging, likely an island of dense glandular tissue.

On physical exam, no suspicious lumps are identified.

Targeted ultrasound is performed, showing island of dense glandular
tissue in the retroareolar region explaining the mammographic
findings. No evidence of malignancy.
IMPRESSION: No mammographic or sonographic evidence of malignancy.

RECOMMENDATION:
Annual screening mammography.

I have discussed the findings and recommendations with the patient.
If applicable, a reminder letter will be sent to the patient
regarding the next appointment.

BI-RADS CATEGORY  2: Benign.

## 2023-04-21 IMAGING — US US BREAST*R* LIMITED INC AXILLA
1 series · 8 of 8 positions shown · non-contrast
Comparison: Previous exam(s).
COMPARISON: Previous exam(s).

Addendum:
CLINICAL DATA: The patient was called back for a possible right
breast mass versus focal asymmetry.

EXAM:
DIGITAL DIAGNOSTIC UNILATERAL RIGHT MAMMOGRAM WITH TOMOSYNTHESIS AND
CAD
TECHNIQUE: Right digital diagnostic mammography and breast tomosynthesis was
performed. The images were evaluated with computer-aided detection.

[Series 1: us breast*right* limited inc axilla · 0.06mm/px · 8 of 8 slices shown]
[im 1/8]
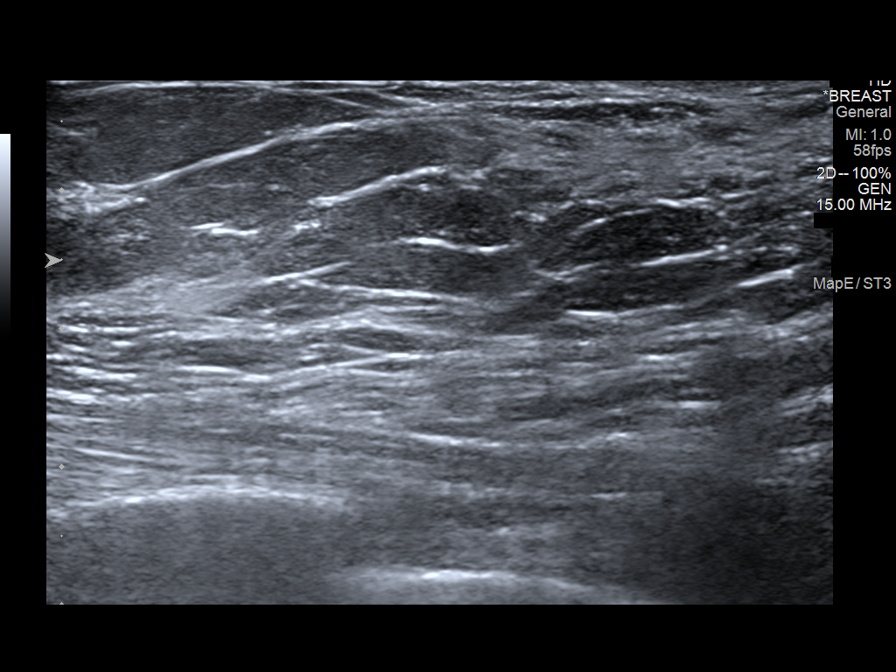
[im 2/8]
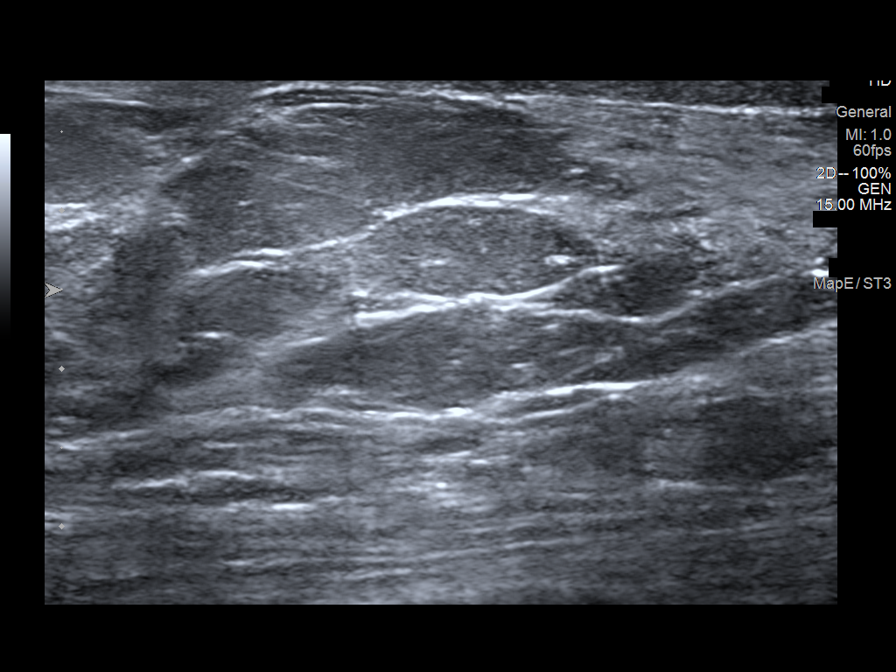
[im 3/8]
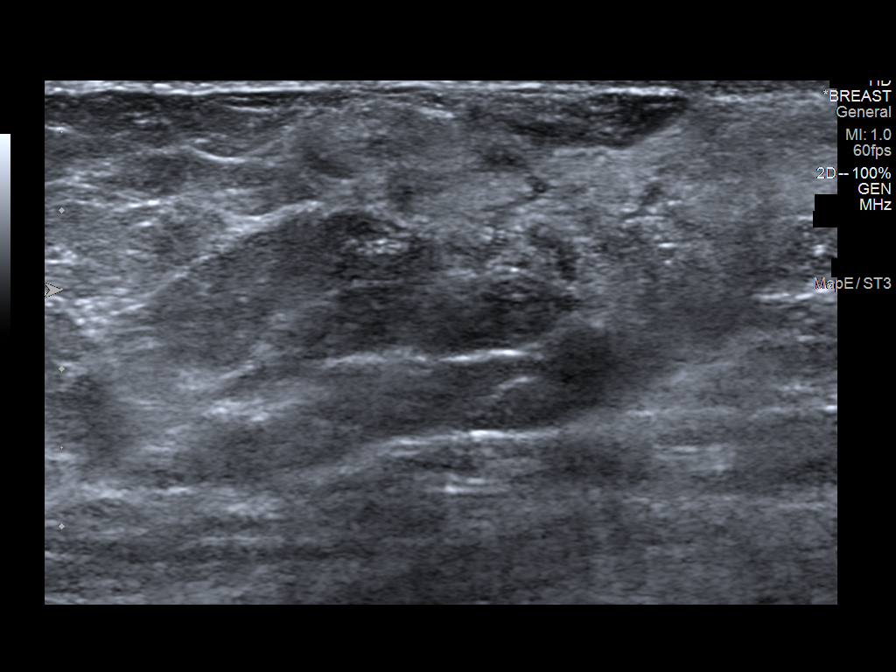
[im 4/8]
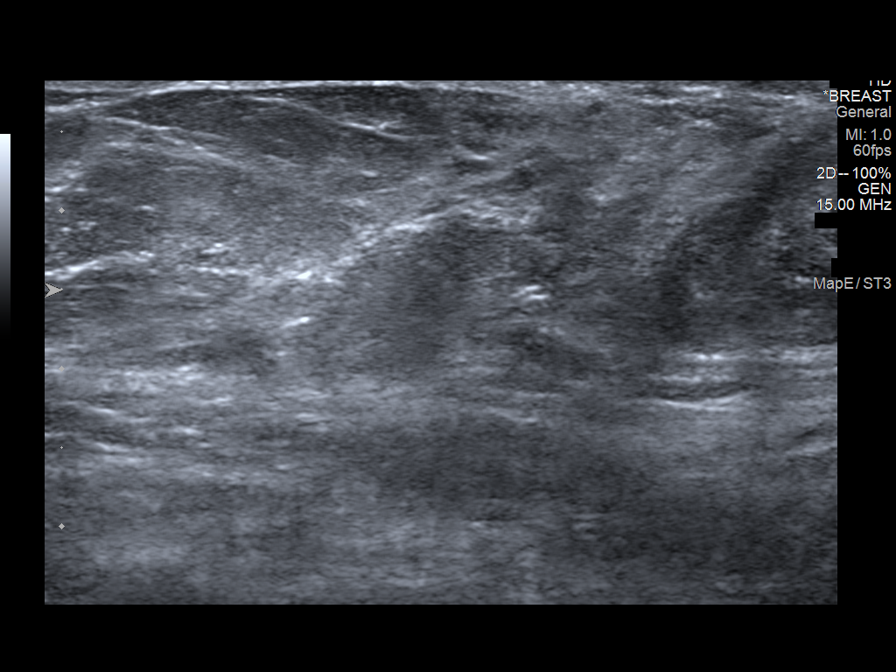
[im 5/8]
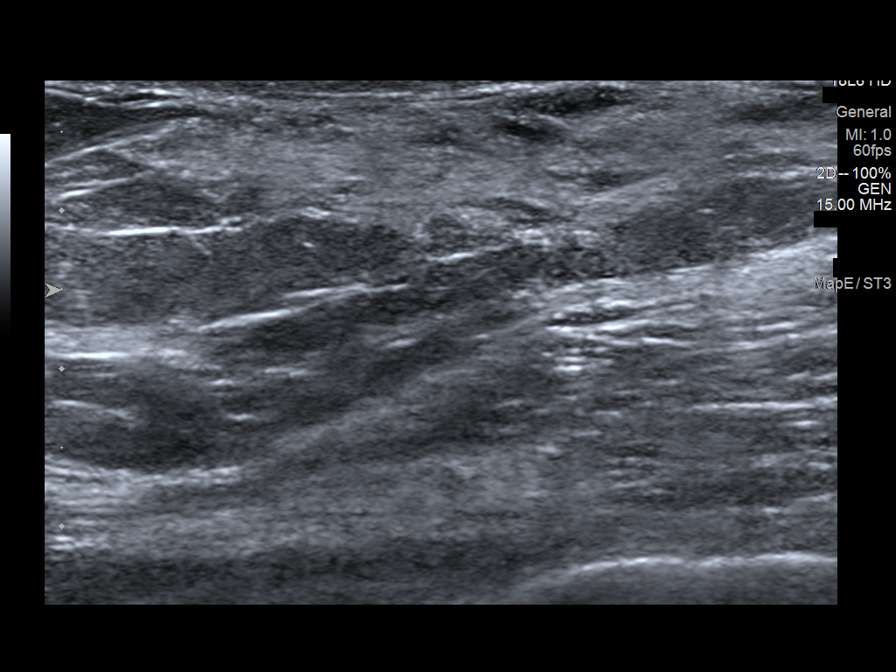
[im 6/8]
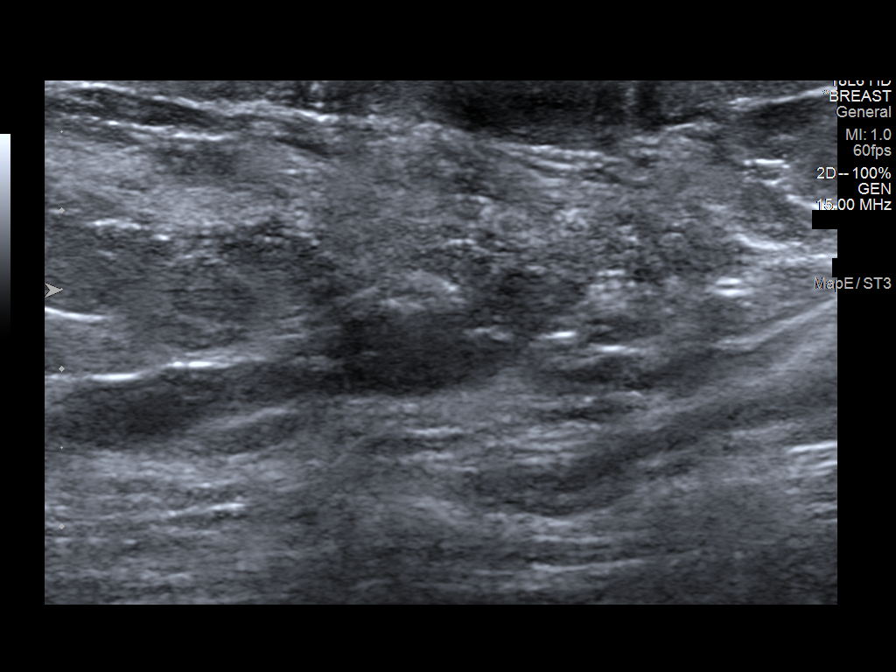
[im 7/8]
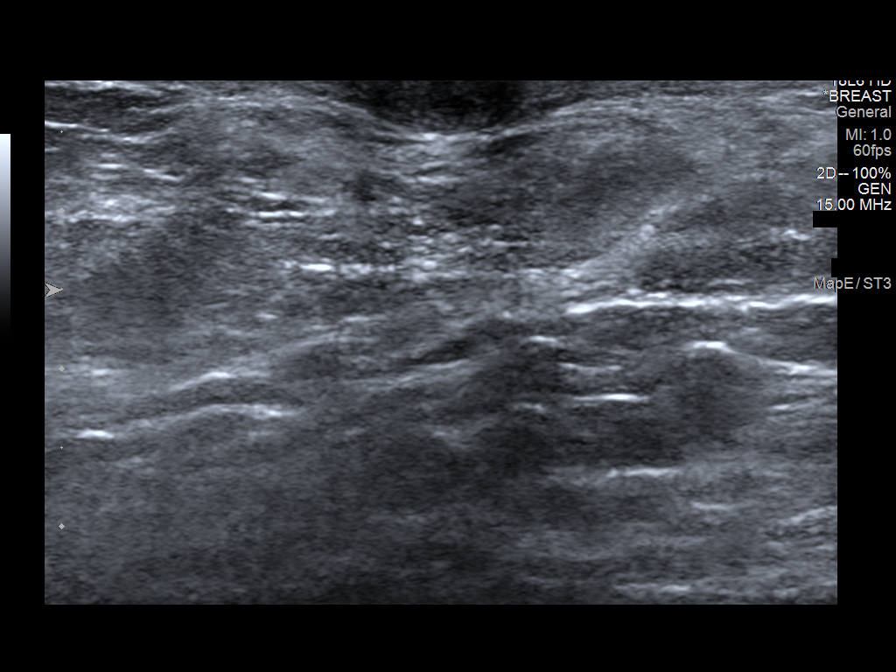
[im 8/8]
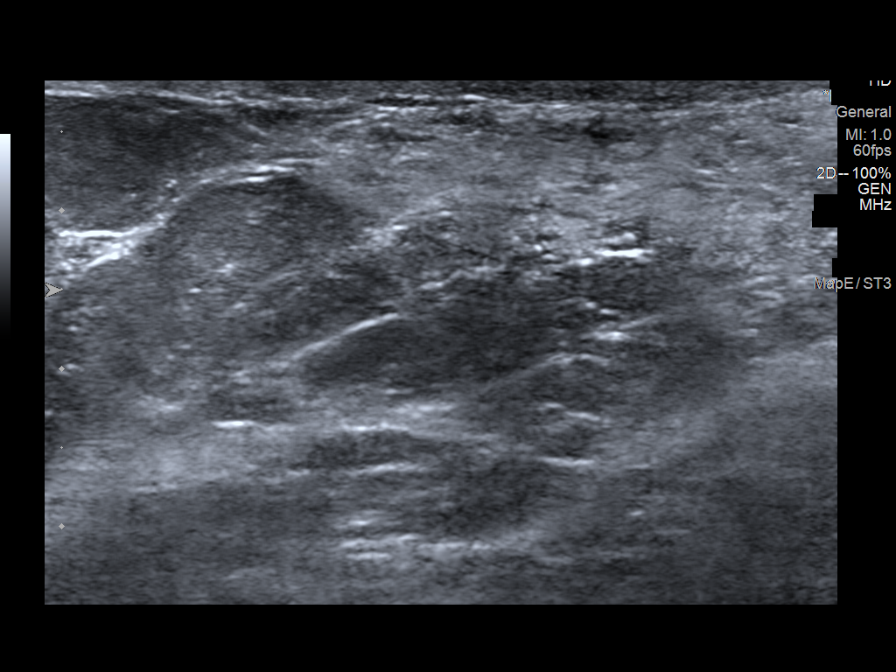

[8 of 8 positions shown; findings below may reference images not displayed]

ACR Breast Density Category b: There are scattered areas of
fibroglandular density.
FINDINGS: The possible right breast mass versus focal asymmetry improves on
today's imaging, likely an island of dense glandular tissue.

On physical exam, no suspicious lumps are identified.

Targeted ultrasound is performed, showing island of dense glandular
tissue in the retroareolar region explaining the mammographic
findings. No evidence of malignancy.
IMPRESSION: No mammographic or sonographic evidence of malignancy.

RECOMMENDATION:
Annual screening mammography.

I have discussed the findings and recommendations with the patient.
If applicable, a reminder letter will be sent to the patient
regarding the next appointment.

BI-RADS CATEGORY  2: Benign.

ADDENDUM:
A right breast ultrasound was also performed.

*** End of Addendum ***
ACR Breast Density Category b: There are scattered areas of
fibroglandular density.
FINDINGS: The possible right breast mass versus focal asymmetry improves on
today's imaging, likely an island of dense glandular tissue.

On physical exam, no suspicious lumps are identified.

Targeted ultrasound is performed, showing island of dense glandular
tissue in the retroareolar region explaining the mammographic
findings. No evidence of malignancy.
IMPRESSION: No mammographic or sonographic evidence of malignancy.

RECOMMENDATION:
Annual screening mammography.

I have discussed the findings and recommendations with the patient.
If applicable, a reminder letter will be sent to the patient
regarding the next appointment.

BI-RADS CATEGORY  2: Benign.

## 2023-04-22 ENCOUNTER — Other Ambulatory Visit: Payer: Self-pay | Admitting: Internal Medicine

## 2023-04-22 DIAGNOSIS — F32A Depression, unspecified: Secondary | ICD-10-CM

## 2023-04-27 NOTE — Telephone Encounter (Signed)
LOV was 10/10/22. I called pt - no answer; mailbox is full, unable to leave a message. Last 3 appts were No Show.

## 2023-04-30 ENCOUNTER — Other Ambulatory Visit: Payer: Self-pay | Admitting: Internal Medicine

## 2023-04-30 DIAGNOSIS — E1142 Type 2 diabetes mellitus with diabetic polyneuropathy: Secondary | ICD-10-CM

## 2023-04-30 NOTE — Telephone Encounter (Signed)
Medication sent to pharmacy  

## 2023-05-16 ENCOUNTER — Encounter: Payer: 59 | Admitting: Student

## 2023-06-10 ENCOUNTER — Other Ambulatory Visit: Payer: Self-pay | Admitting: Internal Medicine

## 2023-06-10 DIAGNOSIS — I10 Essential (primary) hypertension: Secondary | ICD-10-CM

## 2023-06-11 NOTE — Telephone Encounter (Signed)
 Medication discontinued on 01/06/23

## 2023-06-21 ENCOUNTER — Other Ambulatory Visit: Payer: Self-pay | Admitting: Internal Medicine

## 2023-06-21 ENCOUNTER — Telehealth: Payer: Self-pay | Admitting: *Deleted

## 2023-06-21 DIAGNOSIS — Z1231 Encounter for screening mammogram for malignant neoplasm of breast: Secondary | ICD-10-CM

## 2023-06-21 NOTE — Telephone Encounter (Signed)
 Tried to contact patienr regarding mammogram/ phone voice messge is full not able to accept calls.

## 2023-06-21 NOTE — Progress Notes (Signed)
Mammogram order placed for breast cancer screening

## 2023-07-05 ENCOUNTER — Telehealth: Payer: Self-pay | Admitting: *Deleted

## 2023-07-05 NOTE — Telephone Encounter (Signed)
 Mammogram appointment November 12.25 @ 11:10 am to arrive 10:45 am. Unable to contact patient by phone reached out to brother-Hubert to text her the message regarding her mammogram appointment the 75.00 now show fee. Velda Shell states he has trouble reaching her by calling her. Appointment mailed to the patient,

## 2023-07-19 ENCOUNTER — Other Ambulatory Visit: Payer: Self-pay

## 2023-07-19 ENCOUNTER — Inpatient Hospital Stay (HOSPITAL_COMMUNITY)
Admission: EM | Admit: 2023-07-19 | Discharge: 2023-07-23 | DRG: 987 | Disposition: A | Discharge status: Home / Self Care | Attending: Internal Medicine | Admitting: Internal Medicine

## 2023-07-19 ENCOUNTER — Encounter (HOSPITAL_COMMUNITY): Payer: Self-pay

## 2023-07-19 DIAGNOSIS — D72829 Elevated white blood cell count, unspecified: Secondary | ICD-10-CM | POA: Diagnosis not present

## 2023-07-19 DIAGNOSIS — Z794 Long term (current) use of insulin: Secondary | ICD-10-CM

## 2023-07-19 DIAGNOSIS — I10 Essential (primary) hypertension: Secondary | ICD-10-CM | POA: Diagnosis not present

## 2023-07-19 DIAGNOSIS — K439 Ventral hernia without obstruction or gangrene: Secondary | ICD-10-CM | POA: Diagnosis not present

## 2023-07-19 DIAGNOSIS — G9340 Encephalopathy, unspecified: Secondary | ICD-10-CM | POA: Diagnosis present

## 2023-07-19 DIAGNOSIS — K760 Fatty (change of) liver, not elsewhere classified: Secondary | ICD-10-CM | POA: Diagnosis not present

## 2023-07-19 DIAGNOSIS — E1122 Type 2 diabetes mellitus with diabetic chronic kidney disease: Secondary | ICD-10-CM | POA: Diagnosis present

## 2023-07-19 DIAGNOSIS — R410 Disorientation, unspecified: Secondary | ICD-10-CM | POA: Diagnosis not present

## 2023-07-19 DIAGNOSIS — R739 Hyperglycemia, unspecified: Secondary | ICD-10-CM | POA: Diagnosis not present

## 2023-07-19 DIAGNOSIS — Z79890 Hormone replacement therapy (postmenopausal): Secondary | ICD-10-CM

## 2023-07-19 DIAGNOSIS — R000 Tachycardia, unspecified: Secondary | ICD-10-CM | POA: Diagnosis not present

## 2023-07-19 DIAGNOSIS — E039 Hypothyroidism, unspecified: Secondary | ICD-10-CM | POA: Diagnosis present

## 2023-07-19 DIAGNOSIS — Z801 Family history of malignant neoplasm of trachea, bronchus and lung: Secondary | ICD-10-CM

## 2023-07-19 DIAGNOSIS — K449 Diaphragmatic hernia without obstruction or gangrene: Secondary | ICD-10-CM | POA: Diagnosis not present

## 2023-07-19 DIAGNOSIS — E11649 Type 2 diabetes mellitus with hypoglycemia without coma: Secondary | ICD-10-CM | POA: Diagnosis not present

## 2023-07-19 DIAGNOSIS — Z9049 Acquired absence of other specified parts of digestive tract: Secondary | ICD-10-CM

## 2023-07-19 DIAGNOSIS — T383X6A Underdosing of insulin and oral hypoglycemic [antidiabetic] drugs, initial encounter: Secondary | ICD-10-CM | POA: Diagnosis present

## 2023-07-19 DIAGNOSIS — E785 Hyperlipidemia, unspecified: Secondary | ICD-10-CM | POA: Diagnosis present

## 2023-07-19 DIAGNOSIS — E1121 Type 2 diabetes mellitus with diabetic nephropathy: Secondary | ICD-10-CM

## 2023-07-19 DIAGNOSIS — R6889 Other general symptoms and signs: Secondary | ICD-10-CM | POA: Diagnosis not present

## 2023-07-19 DIAGNOSIS — E1165 Type 2 diabetes mellitus with hyperglycemia: Secondary | ICD-10-CM | POA: Diagnosis not present

## 2023-07-19 DIAGNOSIS — Z91148 Patient's other noncompliance with medication regimen for other reason: Secondary | ICD-10-CM

## 2023-07-19 DIAGNOSIS — F32A Depression, unspecified: Secondary | ICD-10-CM | POA: Diagnosis present

## 2023-07-19 DIAGNOSIS — I129 Hypertensive chronic kidney disease with stage 1 through stage 4 chronic kidney disease, or unspecified chronic kidney disease: Secondary | ICD-10-CM | POA: Diagnosis not present

## 2023-07-19 DIAGNOSIS — E1110 Type 2 diabetes mellitus with ketoacidosis without coma: Secondary | ICD-10-CM | POA: Diagnosis not present

## 2023-07-19 DIAGNOSIS — N1832 Chronic kidney disease, stage 3b: Secondary | ICD-10-CM | POA: Diagnosis not present

## 2023-07-19 DIAGNOSIS — Z79899 Other long term (current) drug therapy: Secondary | ICD-10-CM

## 2023-07-19 DIAGNOSIS — I499 Cardiac arrhythmia, unspecified: Secondary | ICD-10-CM | POA: Diagnosis not present

## 2023-07-19 DIAGNOSIS — Z806 Family history of leukemia: Secondary | ICD-10-CM | POA: Diagnosis not present

## 2023-07-19 DIAGNOSIS — N9089 Other specified noninflammatory disorders of vulva and perineum: Secondary | ICD-10-CM | POA: Diagnosis present

## 2023-07-19 DIAGNOSIS — G9341 Metabolic encephalopathy: Secondary | ICD-10-CM | POA: Diagnosis present

## 2023-07-19 DIAGNOSIS — K429 Umbilical hernia without obstruction or gangrene: Secondary | ICD-10-CM | POA: Diagnosis not present

## 2023-07-19 DIAGNOSIS — Z743 Need for continuous supervision: Secondary | ICD-10-CM | POA: Diagnosis not present

## 2023-07-19 LAB — I-STAT CHEM 8, ED
BUN: 29 mg/dL — ABNORMAL HIGH (ref 8–23)
Calcium, Ion: 1.11 mmol/L — ABNORMAL LOW (ref 1.15–1.40)
Chloride: 94 mmol/L — ABNORMAL LOW (ref 98–111)
Creatinine, Ser: 1.2 mg/dL — ABNORMAL HIGH (ref 0.44–1.00)
Glucose, Bld: >700 mg/dL (ref 70–99)
HCT: 46 % (ref 36.0–46.0)
Hemoglobin: 15.6 g/dL — ABNORMAL HIGH (ref 12.0–15.0)
Potassium: 4.4 mmol/L (ref 3.5–5.1)
Sodium: 130 mmol/L — ABNORMAL LOW (ref 135–145)
TCO2: 20 mmol/L — ABNORMAL LOW (ref 22–32)

## 2023-07-19 LAB — URINALYSIS, ROUTINE W REFLEX MICROSCOPIC
Bacteria, UA: NONE SEEN
Bilirubin Urine: NEGATIVE
Glucose, UA: >=500 mg/dL — AB
Ketones, ur: 20 mg/dL — AB
Nitrite: NEGATIVE
Protein, ur: NEGATIVE mg/dL
Specific Gravity, Urine: 1.027 (ref 1.005–1.030)
pH: 5 (ref 5.0–8.0)

## 2023-07-19 LAB — CBC WITH DIFFERENTIAL/PLATELET
Abs Immature Granulocytes: 0.08 10*3/uL — ABNORMAL HIGH (ref 0.00–0.07)
Basophils Absolute: 0.1 10*3/uL (ref 0.0–0.1)
Basophils Relative: 0 %
Eosinophils Absolute: 0 10*3/uL (ref 0.0–0.5)
Eosinophils Relative: 0 %
HCT: 48.4 % — ABNORMAL HIGH (ref 36.0–46.0)
Hemoglobin: 15.9 g/dL — ABNORMAL HIGH (ref 12.0–15.0)
Immature Granulocytes: 1 %
Lymphocytes Relative: 5 %
Lymphs Abs: 0.9 10*3/uL (ref 0.7–4.0)
MCH: 30.9 pg (ref 26.0–34.0)
MCHC: 32.9 g/dL (ref 30.0–36.0)
MCV: 94.2 fL (ref 80.0–100.0)
Monocytes Absolute: 0.5 10*3/uL (ref 0.1–1.0)
Monocytes Relative: 3 %
Neutro Abs: 14.8 10*3/uL — ABNORMAL HIGH (ref 1.7–7.7)
Neutrophils Relative %: 91 %
Platelets: 321 10*3/uL (ref 150–400)
RBC: 5.14 MIL/uL — ABNORMAL HIGH (ref 3.87–5.11)
RDW: 12.6 % (ref 11.5–15.5)
WBC: 16.3 10*3/uL — ABNORMAL HIGH (ref 4.0–10.5)
nRBC: 0 % (ref 0.0–0.2)

## 2023-07-19 LAB — BASIC METABOLIC PANEL
Anion gap: 20 — ABNORMAL HIGH (ref 5–15)
BUN: 29 mg/dL — ABNORMAL HIGH (ref 8–23)
CO2: 18 mmol/L — ABNORMAL LOW (ref 22–32)
Calcium: 9.2 mg/dL (ref 8.9–10.3)
Chloride: 86 mmol/L — ABNORMAL LOW (ref 98–111)
Creatinine, Ser: 1.52 mg/dL — ABNORMAL HIGH (ref 0.44–1.00)
GFR, Estimated: 36 mL/min — ABNORMAL LOW (ref >60)
Glucose, Bld: 1109 mg/dL (ref 70–99)
Potassium: 4.4 mmol/L (ref 3.5–5.1)
Sodium: 124 mmol/L — ABNORMAL LOW (ref 135–145)

## 2023-07-19 LAB — CBG MONITORING, ED
Glucose-Capillary: >600 mg/dL (ref 70–99)
Glucose-Capillary: >600 mg/dL (ref 70–99)
Glucose-Capillary: >600 mg/dL (ref 70–99)
Glucose-Capillary: >600 mg/dL (ref 70–99)

## 2023-07-19 LAB — BLOOD GAS, VENOUS
Acid-base deficit: 1.6 mmol/L (ref 0.0–2.0)
Bicarbonate: 22.1 mmol/L (ref 20.0–28.0)
O2 Saturation: 66 %
Patient temperature: 37
pCO2, Ven: 34 mmHg — ABNORMAL LOW (ref 44–60)
pH, Ven: 7.42 (ref 7.25–7.43)
pO2, Ven: 37 mmHg (ref 32–45)

## 2023-07-19 LAB — COMPREHENSIVE METABOLIC PANEL
ALT: 10 U/L (ref 0–44)
AST: 15 U/L (ref 15–41)
Albumin: 4.1 g/dL (ref 3.5–5.0)
Alkaline Phosphatase: 118 U/L (ref 38–126)
Anion gap: 23 — ABNORMAL HIGH (ref 5–15)
BUN: 30 mg/dL — ABNORMAL HIGH (ref 8–23)
CO2: 18 mmol/L — ABNORMAL LOW (ref 22–32)
Calcium: 9.2 mg/dL (ref 8.9–10.3)
Chloride: 87 mmol/L — ABNORMAL LOW (ref 98–111)
Creatinine, Ser: 1.54 mg/dL — ABNORMAL HIGH (ref 0.44–1.00)
GFR, Estimated: 36 mL/min — ABNORMAL LOW (ref >60)
Glucose, Bld: 1121 mg/dL (ref 70–99)
Potassium: 4.4 mmol/L (ref 3.5–5.1)
Sodium: 128 mmol/L — ABNORMAL LOW (ref 135–145)
Total Bilirubin: 1.4 mg/dL — ABNORMAL HIGH (ref 0.0–1.2)
Total Protein: 8 g/dL (ref 6.5–8.1)

## 2023-07-19 LAB — BETA-HYDROXYBUTYRIC ACID: Beta-Hydroxybutyric Acid: 4.37 mmol/L — ABNORMAL HIGH (ref 0.05–0.27)

## 2023-07-19 MED ORDER — POTASSIUM CHLORIDE 10 MEQ/100ML IV SOLN
10.0000 meq | INTRAVENOUS | Status: AC
  Administered 2023-07-19 (×2): 10 meq via INTRAVENOUS
  Filled 2023-07-19 (×2): qty 100

## 2023-07-19 MED ORDER — ROSUVASTATIN CALCIUM 20 MG PO TABS
20.0000 mg | ORAL_TABLET | Freq: Every day | ORAL | Status: DC
  Administered 2023-07-20 – 2023-07-23 (×4): 20 mg via ORAL
  Filled 2023-07-19 (×4): qty 1

## 2023-07-19 MED ORDER — HEPARIN SODIUM (PORCINE) 5000 UNIT/ML IJ SOLN
5000.0000 [IU] | Freq: Three times a day (TID) | INTRAMUSCULAR | Status: DC
  Administered 2023-07-20 – 2023-07-23 (×12): 5000 [IU] via SUBCUTANEOUS
  Filled 2023-07-19 (×12): qty 1

## 2023-07-19 MED ORDER — PAROXETINE HCL 10 MG PO TABS
40.0000 mg | ORAL_TABLET | Freq: Every day | ORAL | Status: DC
  Administered 2023-07-20 – 2023-07-23 (×4): 40 mg via ORAL
  Filled 2023-07-19: qty 2
  Filled 2023-07-19 (×2): qty 4
  Filled 2023-07-19: qty 2

## 2023-07-19 MED ORDER — DEXTROSE 50 % IV SOLN: 0.0000 mL | INTRAVENOUS | Status: DC | PRN

## 2023-07-19 MED ORDER — LEVOTHYROXINE SODIUM 50 MCG PO TABS
50.0000 ug | ORAL_TABLET | Freq: Every day | ORAL | Status: AC
Start: 2023-07-20 — End: ?
  Administered 2023-07-20 – 2023-07-23 (×4): 50 ug via ORAL
  Filled 2023-07-19 (×2): qty 1
  Filled 2023-07-19 (×2): qty 2

## 2023-07-19 MED ORDER — LACTATED RINGERS IV SOLN: INTRAVENOUS | Status: AC

## 2023-07-19 MED ORDER — LACTATED RINGERS IV BOLUS
1000.0000 mL | Freq: Once | INTRAVENOUS | Status: AC
  Administered 2023-07-19: 1000 mL via INTRAVENOUS

## 2023-07-19 MED ORDER — PROCHLORPERAZINE EDISYLATE 10 MG/2ML IJ SOLN
5.0000 mg | Freq: Four times a day (QID) | INTRAMUSCULAR | Status: DC | PRN
  Administered 2023-07-19 – 2023-07-21 (×3): 5 mg via INTRAVENOUS
  Filled 2023-07-19 (×3): qty 2

## 2023-07-19 MED ORDER — ATENOLOL 50 MG PO TABS
50.0000 mg | ORAL_TABLET | Freq: Two times a day (BID) | ORAL | Status: DC
  Administered 2023-07-20 – 2023-07-23 (×8): 50 mg via ORAL
  Filled 2023-07-19: qty 1
  Filled 2023-07-19 (×2): qty 2
  Filled 2023-07-19: qty 1
  Filled 2023-07-19: qty 2
  Filled 2023-07-19: qty 1
  Filled 2023-07-19: qty 2
  Filled 2023-07-19: qty 1

## 2023-07-19 MED ORDER — INSULIN REGULAR(HUMAN) IN NACL 100-0.9 UT/100ML-% IV SOLN
INTRAVENOUS | Status: DC
  Administered 2023-07-19: 7 [IU]/h via INTRAVENOUS
  Filled 2023-07-19: qty 100

## 2023-07-19 MED ORDER — DEXTROSE IN LACTATED RINGERS 5 % IV SOLN: INTRAVENOUS | Status: AC

## 2023-07-19 NOTE — ED Notes (Signed)
 Critical glucose result Junior RN and MD aware

## 2023-07-19 NOTE — ED Triage Notes (Signed)
 Brought by EMS from home for vomiting/weakness/disorientation x3 days. Glucose reading HI at home. Ran out of insulin per son. Increased thirst reported by EMS. Persistent HTN. Decline in mental status over past few months per son. Disoriented to time currently. 170/100, HR 76, 98% RA, 97.70F. No pain reported.

## 2023-07-19 NOTE — H&P (Signed)
 History and Physical    ANGILA WOMBLES IHK:[id number] DOB: 1950-10-28 DOA: 07/19/2023  PCP: Rudene Christians, DO   Patient coming from: Home   Chief Complaint: N/V, hyperglycemia, disorientation   HPI: MARTA BOUIE is a 73 y.o. female with medical history significant for hypertension, hyperlipidemia, insulin-dependent diabetes mellitus, depression, and CKD 3B who presents with nausea, vomiting, disorientation, and elevated blood glucose.   Patient ran out of her insulin recently and has developed the aforementioned symptoms over the past 3 days.  He denies abdominal pain, chest pain, cough, or shortness of breath.  ED Course: Upon arrival to the ED, patient is found to be afebrile and saturating well on room air with elevated heart rate and stable blood pressure.  Labs are most notable for glucose 1121, bicarbonate 18, creatinine 1.54, anion gap 23, beta hydroxybutyrate 4.37, and WBC 16,300.  Patient was given a liter of LR, 20 mEq IV potassium, and was started on insulin infusion in the ED.  Review of Systems:  All other systems reviewed and apart from HPI, are negative.  Past Medical History:  Diagnosis Date   Arthritis    bilateral knees and back;   Depression    on meds, working well   Diabetes mellitus without complication (HCC) 05/31/2012   on meds   Heart palpitations    rapid   Hyperlipidemia    on meds   Hypertension    on meds    Past Surgical History:  Procedure Laterality Date   APPENDECTOMY  1968   CESAREAN SECTION  1980   CHOLECYSTECTOMY  1985   TONSILLECTOMY  1960    Social History:   reports that she has never smoked. She has never used smokeless tobacco. She reports that she does not drink alcohol and does not use drugs.  No Known Allergies  Family History  Problem Relation Age of Onset   Cancer Mother        leukemia   Cancer Father        lung   Breast cancer Neg Hx    Colon polyps Neg Hx    Crohn's disease Neg Hx    Esophageal cancer  Neg Hx    Stomach cancer Neg Hx    Rectal cancer Neg Hx      Prior to Admission medications   Medication Sig Start Date End Date Taking? Authorizing Provider  atenolol (TENORMIN) 50 MG tablet Take 50 mg by mouth 2 (two) times daily. 03/16/23  Yes [provider]  Continuous Glucose Sensor (FREESTYLE LIBRE 3 SENSOR) MISC PLACE SENSOR ON THE SKIN EVERY 14 DAYS. USE TO CHECK GLUCOSE CONTI NUOUSLY 04/30/23  Yes Masters, Katie, DO  EUTHYROX 50 MCG tablet Take 1 tablet (50 mcg total) by mouth daily. 04/27/22  Yes Masters, Katie, DO  gabapentin (NEURONTIN) 300 MG capsule Take 1 capsule (300 mg total) by mouth at bedtime. 01/06/23  Yes Alwyn Ren, MD  insulin isophane & regular human KwikPen (HUMULIN 70/30 KWIKPEN) (70-30) 100 UNIT/ML KwikPen Inject 10 units in the morning and 6 units at night 01/06/23  Yes Alwyn Ren, MD  PARoxetine (PAXIL) 40 MG tablet TAKE 1 TABLET BY MOUTH ONCE DAILY AFTER BREAKFAST 07/23/22  Yes Masters, Florentina Addison, DO  rosuvastatin (CRESTOR) 20 MG tablet Take 1 tablet by mouth once daily 12/28/22  Yes Masters, Katie, DO  Accu-Chek Softclix Lancets lancets Check blood sugar 3 times a day 01/06/21   Evlyn Kanner, MD  Continuous Blood Gluc Receiver (FREESTYLE LIBRE 3  READER) DEVI USE AS DIRECTED 07/13/22   Masters, Florentina Addison, DO  glucose blood (ACCU-CHEK GUIDE) test strip Check blood sugar 3 times per day 01/06/21   Evlyn Kanner, MD  Insulin Pen Needle (PEN NEEDLES) 31G X 5 MM MISC 1 each by Does not apply route daily. 12/05/22   Masters, Florentina Addison, DO    Physical Exam: Vitals:   07/19/23 2000 07/19/23 2002 07/19/23 2221  BP: (!) 148/93  (!) 149/85  Pulse: (!) 125  (!) 115  Resp: 20  15  Temp: 98.4 F (36.9 C)  98.3 F (36.8 C)  TempSrc: Oral    SpO2: 100%  93%  Weight:  65.8 kg   Height:  5' (1.524 m)     Constitutional: NAD, calm  Eyes: PERTLA, lids and conjunctivae normal ENMT: Mucous membranes are moist. Posterior pharynx clear of any exudate or  lesions.   Neck: supple, no masses  Respiratory: no wheezing, no crackles. No accessory muscle use.  Cardiovascular: S1 & S2 heard, regular rate and rhythm. No extremity edema.   Abdomen: No distension, no tenderness, soft. Bowel sounds active.  Musculoskeletal: no clubbing / cyanosis. No joint deformity upper and lower extremities.   Skin: no significant rashes, lesions, ulcers. Warm, dry, well-perfused. Neurologic: CN 2-12 grossly intact. Sensation to light touch intact. Strength 5/5 in all 4 limbs. Alert and oriented to person, place, and situation.  Psychiatric: Pleasant. Cooperative.    Labs and Imaging on Admission: I have personally reviewed following labs and imaging studies  CBC: Recent Labs  Lab 07/19/23 2027 07/19/23 2145  WBC 16.3*  --   NEUTROABS 14.8*  --   HGB 15.9* 15.6*  HCT 48.4* 46.0  MCV 94.2  --   PLT 321  --    Basic Metabolic Panel: Recent Labs  Lab 07/19/23 2027 07/19/23 2145  NA 128* 130*  K 4.4 4.4  CL 87* 94*  CO2 18*  --   GLUCOSE 1,121* >700*  BUN 30* 29*  CREATININE 1.54* 1.20*  CALCIUM 9.2  --    GFR: Estimated Creatinine Clearance: 36.4 mL/min (A) (by C-G formula based on SCr of 1.2 mg/dL (H)). Liver Function Tests: Recent Labs  Lab 07/19/23 2027  AST 15  ALT 10  ALKPHOS 118  BILITOT 1.4*  PROT 8.0  ALBUMIN 4.1   No results for input(s): "LIPASE", "AMYLASE" in the last 168 hours. No results for input(s): "AMMONIA" in the last 168 hours. Coagulation Profile: No results for input(s): "INR", "PROTIME" in the last 168 hours. Cardiac Enzymes: No results for input(s): "CKTOTAL", "CKMB", "CKMBINDEX", "TROPONINI" in the last 168 hours. BNP (last 3 results) No results for input(s): "PROBNP" in the last 8760 hours. HbA1C: No results for input(s): "HGBA1C" in the last 72 hours. CBG: Recent Labs  Lab 07/19/23 2022  GLUCAP >600*   Lipid Profile: No results for input(s): "CHOL", "HDL", "LDLCALC", "TRIG", "CHOLHDL", "LDLDIRECT" in  the last 72 hours. Thyroid Function Tests: No results for input(s): "TSH", "T4TOTAL", "FREET4", "T3FREE", "THYROIDAB" in the last 72 hours. Anemia Panel: No results for input(s): "VITAMINB12", "FOLATE", "FERRITIN", "TIBC", "IRON", "RETICCTPCT" in the last 72 hours. Urine analysis:    Component Value Date/Time   COLORURINE STRAW (A) 07/19/2023 2042   APPEARANCEUR CLEAR 07/19/2023 2042   LABSPEC 1.027 07/19/2023 2042   PHURINE 5.0 07/19/2023 2042   GLUCOSEU >=500 (A) 07/19/2023 2042   HGBUR MODERATE (A) 07/19/2023 2042   BILIRUBINUR NEGATIVE 07/19/2023 2042   KETONESUR 20 (A) 07/19/2023 2042   PROTEINUR NEGATIVE  07/19/2023 2042   UROBILINOGEN 0.2 11/05/2013 1104   NITRITE NEGATIVE 07/19/2023 2042   LEUKOCYTESUR TRACE (A) 07/19/2023 2042   Sepsis Labs: @LABRCNTIP (procalcitonin:4,lacticidven:4) )No results found for this or any previous visit (from the past 240 hours).   Radiological Exams on Admission: No results found.   Assessment/Plan   1. DKA; type II DM  - A1c was 7.1% in June 2024   - This is due to running out of insulin  - Continue IVF hydration and IV insulin infusion with frequent CBGs and serial chem panels until DKA resolved, consult diabetes educator    2. Acute encephalopathy  - Improving, no focal deficit identified, likely related to severe metabolic derangements  - Delirium precautions, monitor for improvement with treatment of DKA    3. Hypertension  - Atenolol    4. HLD  - Crestor    5. Depression  - Paxil   6. Hypothyroidism  - Synthroid     DVT prophylaxis: sq heparin  Code Status: Full  Level of Care: Level of care: Stepdown Family Communication: Son updated from ED   Disposition Plan:  Patient is from: home  Anticipated d/c is to: TBD Anticipated d/c date is: 07/22/23  Patient currently: Pending glycemic-control, improved mental status  Consults called: None  Admission status: Inpatient     Briscoe Deutscher, MD [facility]  07/19/2023, 10:31 PM

## 2023-07-19 NOTE — ED Provider Notes (Signed)
  EMERGENCY DEPARTMENT AT [facility] Provider Note  CSN: [id number] Arrival date & time: 07/19/23 1952  Chief Complaint(s) Hyperglycemia and Vomiting  HPI Jennifer Ingram is a 73 y.o. female with PMH T2DM insulin-dependent, CKD 3, hypothyroidism who presents emergency room for evaluation of hyperglycemia, nausea.  States that she ran out of her insulin a few days ago and has been having polyuria, polydipsia and nausea.  Denies abdominal pain, chest pain, shortness of breath, headache, fever or other systemic symptoms.  Patient arrives tachycardic with dry tacky mucous membranes.   Past Medical History Past Medical History:  Diagnosis Date   Arthritis    bilateral knees and back;   Depression    on meds, working well   Diabetes mellitus without complication (HCC) 05/31/2012   on meds   Heart palpitations    rapid   Hyperlipidemia    on meds   Hypertension    on meds   Patient Active Problem List   Diagnosis Date Noted   DKA (diabetic ketoacidosis) (HCC) 07/19/2023   Acute encephalopathy 07/19/2023   Unsteady gait 01/05/2023   Orthostatic hypotension 01/04/2023   Hypokalemia 01/04/2023   CKD stage 3b, GFR 30-44 ml/min (HCC) 01/04/2023   Leukocytosis 01/04/2023   Type 2 diabetes mellitus with complication, with long-term current use of insulin (HCC) 01/04/2023   Systolic murmur 10/10/2022   Diminished pulses in lower extremity 11/30/2021   Pain due to onychomycosis of toenails of both feet 02/14/2021   Chronic kidney disease 12/20/2020   Depression 12/17/2020   Diabetes mellitus with polyneuropathy (HCC) 12/17/2020   Heart palpitations 12/17/2020   Hypothyroid 12/17/2020   Hypertension 12/17/2020   Hyperlipidemia 12/17/2020   Healthcare maintenance 12/17/2020   Intertrigo 12/17/2020   Home Medication(s) Prior to Admission medications   Medication Sig Start Date End Date Taking? Authorizing Provider  atenolol (TENORMIN) 50 MG tablet Take 50 mg by  mouth 2 (two) times daily. 03/16/23  Yes [provider]  Continuous Glucose Sensor (FREESTYLE LIBRE 3 SENSOR) MISC PLACE SENSOR ON THE SKIN EVERY 14 DAYS. USE TO CHECK GLUCOSE CONTI NUOUSLY 04/30/23  Yes Masters, Katie, DO  EUTHYROX 50 MCG tablet Take 1 tablet (50 mcg total) by mouth daily. 04/27/22  Yes Masters, Katie, DO  gabapentin (NEURONTIN) 300 MG capsule Take 1 capsule (300 mg total) by mouth at bedtime. 01/06/23  Yes Alwyn Ren, MD  insulin isophane & regular human KwikPen (HUMULIN 70/30 KWIKPEN) (70-30) 100 UNIT/ML KwikPen Inject 10 units in the morning and 6 units at night 01/06/23  Yes Alwyn Ren, MD  PARoxetine (PAXIL) 40 MG tablet TAKE 1 TABLET BY MOUTH ONCE DAILY AFTER BREAKFAST 07/23/22  Yes Masters, Florentina Addison, DO  rosuvastatin (CRESTOR) 20 MG tablet Take 1 tablet by mouth once daily 12/28/22  Yes Masters, Katie, DO  Accu-Chek Softclix Lancets lancets Check blood sugar 3 times a day 01/06/21   Evlyn Kanner, MD  Continuous Blood Gluc Receiver (FREESTYLE LIBRE 3 READER) DEVI USE AS DIRECTED 07/13/22   Masters, Florentina Addison, DO  glucose blood (ACCU-CHEK GUIDE) test strip Check blood sugar 3 times per day 01/06/21   Evlyn Kanner, MD  Insulin Pen Needle (PEN NEEDLES) 31G X 5 MM MISC 1 each by Does not apply route daily. 12/05/22   Masters, Florentina Addison, DO  Past Surgical History Past Surgical History:  Procedure Laterality Date   APPENDECTOMY  1968   CESAREAN SECTION  1980   CHOLECYSTECTOMY  1985   TONSILLECTOMY  1960   Family History Family History  Problem Relation Age of Onset   Cancer Mother        leukemia   Cancer Father        lung   Breast cancer Neg Hx    Colon polyps Neg Hx    Crohn's disease Neg Hx    Esophageal cancer Neg Hx    Stomach cancer Neg Hx    Rectal cancer Neg Hx     Social History Social History   Tobacco Use    Smoking status: Never   Smokeless tobacco: Never  Vaping Use   Vaping status: Never Used  Substance Use Topics   Alcohol use: No   Drug use: No   Allergies Patient has no known allergies.  Review of Systems Review of Systems  Gastrointestinal:  Positive for nausea.  Endocrine: Positive for polydipsia and polyuria.    Physical Exam Vital Signs  I have reviewed the triage vital signs BP (!) 149/85   Pulse (!) 115   Temp 98.3 F (36.8 C)   Resp 15   Ht 5' (1.524 m)   Wt 65.8 kg   SpO2 93%   BMI 28.32 kg/m   Physical Exam Vitals and nursing note reviewed.  Constitutional:      General: She is not in acute distress.    Appearance: She is well-developed.  HENT:     Head: Normocephalic and atraumatic.  Eyes:     Conjunctiva/sclera: Conjunctivae normal.  Cardiovascular:     Rate and Rhythm: Regular rhythm. Tachycardia present.     Heart sounds: No murmur heard. Pulmonary:     Effort: Pulmonary effort is normal. No respiratory distress.     Breath sounds: Normal breath sounds.  Abdominal:     Palpations: Abdomen is soft.     Tenderness: There is no abdominal tenderness.  Musculoskeletal:        General: No swelling.     Cervical back: Neck supple.  Skin:    General: Skin is warm and dry.     Capillary Refill: Capillary refill takes less than 2 seconds.  Neurological:     Mental Status: She is alert.  Psychiatric:        Mood and Affect: Mood normal.     ED Results and Treatments Labs (all labs ordered are listed, but only abnormal results are displayed) Labs Reviewed  COMPREHENSIVE METABOLIC PANEL - Abnormal; Notable for the following components:      Result Value   Sodium 128 (*)    Chloride 87 (*)    CO2 18 (*)    Glucose, Bld 1,121 (*)    BUN 30 (*)    Creatinine, Ser 1.54 (*)    Total Bilirubin 1.4 (*)    GFR, Estimated 36 (*)    Anion gap 23 (*)    All other components within normal limits  URINALYSIS, ROUTINE W REFLEX MICROSCOPIC -  Abnormal; Notable for the following components:   Color, Urine STRAW (*)    Glucose, UA >=500 (*)    Hgb urine dipstick MODERATE (*)    Ketones, ur 20 (*)    Leukocytes,Ua TRACE (*)    All other components within normal limits  CBC WITH DIFFERENTIAL/PLATELET - Abnormal; Notable for the following components:   WBC 16.3 (*)  RBC 5.14 (*)    Hemoglobin 15.9 (*)    HCT 48.4 (*)    Neutro Abs 14.8 (*)    Abs Immature Granulocytes 0.08 (*)    All other components within normal limits  BLOOD GAS, VENOUS - Abnormal; Notable for the following components:   pCO2, Ven 34 (*)    All other components within normal limits  BETA-HYDROXYBUTYRIC ACID - Abnormal; Notable for the following components:   Beta-Hydroxybutyric Acid 4.37 (*)    All other components within normal limits  BASIC METABOLIC PANEL - Abnormal; Notable for the following components:   Sodium 124 (*)    Chloride 86 (*)    CO2 18 (*)    Glucose, Bld 1,109 (*)    BUN 29 (*)    Creatinine, Ser 1.52 (*)    GFR, Estimated 36 (*)    Anion gap 20 (*)    All other components within normal limits  CBG MONITORING, ED - Abnormal; Notable for the following components:   Glucose-Capillary >600 (*)    All other components within normal limits  CBG MONITORING, ED - Abnormal; Notable for the following components:   Glucose-Capillary >600 (*)    All other components within normal limits  I-STAT CHEM 8, ED - Abnormal; Notable for the following components:   Sodium 130 (*)    Chloride 94 (*)    BUN 29 (*)    Creatinine, Ser 1.20 (*)    Glucose, Bld >700 (*)    Calcium, Ion 1.11 (*)    TCO2 20 (*)    Hemoglobin 15.6 (*)    All other components within normal limits  BASIC METABOLIC PANEL  BASIC METABOLIC PANEL  BASIC METABOLIC PANEL  HEMOGLOBIN A1C                                                                                                                          Radiology No results found.  Pertinent labs & imaging results  that were available during my care of the patient were reviewed by me and considered in my medical decision making (see MDM for details).  Medications Ordered in ED Medications  insulin regular, human (MYXREDLIN) 100 units/ 100 mL infusion (7 Units/hr Intravenous Rate/Dose Change 07/19/23 2240)  lactated ringers infusion ( Intravenous New Bag/Given 07/19/23 2212)  dextrose 5 % in lactated ringers infusion (has no administration in time range)  dextrose 50 % solution 0-50 mL (has no administration in time range)  potassium chloride 10 mEq in 100 mL IVPB (10 mEq Intravenous New Bag/Given 07/19/23 2310)  atenolol (TENORMIN) tablet 50 mg (has no administration in time range)  rosuvastatin (CRESTOR) tablet 20 mg (has no administration in time range)  PARoxetine (PAXIL) tablet 40 mg (has no administration in time range)  levothyroxine (SYNTHROID) tablet 50 mcg (has no administration in time range)  heparin injection 5,000 Units (has no administration in time range)  prochlorperazine (COMPAZINE) injection 5 mg (has no administration in time range)  lactated ringers bolus 1,000 mL (0 mLs Intravenous Stopped 07/19/23 2142)                                                                                                                                     Procedures .Critical Care  Performed by: Glendora Score, MD Authorized by: Glendora Score, MD   Critical care provider statement:    Critical care time (minutes):  30   Critical care was necessary to treat or prevent imminent or life-threatening deterioration of the following conditions:  Endocrine crisis   Critical care was time spent personally by me on the following activities:  Development of treatment plan with patient or surrogate, discussions with consultants, evaluation of patient's response to treatment, examination of patient, ordering and review of laboratory studies, ordering and review of radiographic studies, ordering and performing  treatments and interventions, pulse oximetry, re-evaluation of patient's condition and review of old charts   (including critical care time)  Medical Decision Making / ED Course   This patient presents to the ED for concern of hyperglycemia, polyuria, polydipsia, this involves an extensive number of treatment options, and is a complaint that carries with it a high risk of complications and morbidity.  The differential diagnosis includes DKA, HHS, stress hyperglycemia, medication noncompliance, bacteremia  MDM: Patient seen emergency room for evaluation of polyuria, polydipsia and hyperglycemia.  Physical exam with a rapid regular tachycardia but is otherwise unremarkable.  Laboratory evaluation with a leukocytosis to 16.3, pH 7.42, glucose 1121 BUN 30, creatinine 1.54, anion gap is 23 with a beta-hydroxybutyrate of 4.37.  Patient started on an insulin drip, potassium replacement and aggressive fluid resuscitation.  Patient require hospital admission for early onset DKA versus HHS.  Patient admitted   Additional history obtained:  -External records from outside source obtained and reviewed including: Chart review including previous notes, labs, imaging, consultation notes   Lab Tests: -I ordered, reviewed, and interpreted labs.   The pertinent results include:   Labs Reviewed  COMPREHENSIVE METABOLIC PANEL - Abnormal; Notable for the following components:      Result Value   Sodium 128 (*)    Chloride 87 (*)    CO2 18 (*)    Glucose, Bld 1,121 (*)    BUN 30 (*)    Creatinine, Ser 1.54 (*)    Total Bilirubin 1.4 (*)    GFR, Estimated 36 (*)    Anion gap 23 (*)    All other components within normal limits  URINALYSIS, ROUTINE W REFLEX MICROSCOPIC - Abnormal; Notable for the following components:   Color, Urine STRAW (*)    Glucose, UA >=500 (*)    Hgb urine dipstick MODERATE (*)    Ketones, ur 20 (*)    Leukocytes,Ua TRACE (*)    All other components within normal limits  CBC  WITH DIFFERENTIAL/PLATELET - Abnormal; Notable for the following components:   WBC 16.3 (*)  RBC 5.14 (*)    Hemoglobin 15.9 (*)    HCT 48.4 (*)    Neutro Abs 14.8 (*)    Abs Immature Granulocytes 0.08 (*)    All other components within normal limits  BLOOD GAS, VENOUS - Abnormal; Notable for the following components:   pCO2, Ven 34 (*)    All other components within normal limits  BETA-HYDROXYBUTYRIC ACID - Abnormal; Notable for the following components:   Beta-Hydroxybutyric Acid 4.37 (*)    All other components within normal limits  BASIC METABOLIC PANEL - Abnormal; Notable for the following components:   Sodium 124 (*)    Chloride 86 (*)    CO2 18 (*)    Glucose, Bld 1,109 (*)    BUN 29 (*)    Creatinine, Ser 1.52 (*)    GFR, Estimated 36 (*)    Anion gap 20 (*)    All other components within normal limits  CBG MONITORING, ED - Abnormal; Notable for the following components:   Glucose-Capillary >600 (*)    All other components within normal limits  CBG MONITORING, ED - Abnormal; Notable for the following components:   Glucose-Capillary >600 (*)    All other components within normal limits  I-STAT CHEM 8, ED - Abnormal; Notable for the following components:   Sodium 130 (*)    Chloride 94 (*)    BUN 29 (*)    Creatinine, Ser 1.20 (*)    Glucose, Bld >700 (*)    Calcium, Ion 1.11 (*)    TCO2 20 (*)    Hemoglobin 15.6 (*)    All other components within normal limits  BASIC METABOLIC PANEL  BASIC METABOLIC PANEL  BASIC METABOLIC PANEL  HEMOGLOBIN A1C      Medicines ordered and prescription drug management: Meds ordered this encounter  Medications   lactated ringers bolus 1,000 mL   insulin regular, human (MYXREDLIN) 100 units/ 100 mL infusion    EndoTool Goal Range::   140-180    Type of Diabetes:   Type 1    Mode of Therapy:   ENDOX1 for DKA    Start Method:   EndoTool to calculate   lactated ringers infusion   dextrose 5 % in lactated ringers infusion    dextrose 50 % solution 0-50 mL   potassium chloride 10 mEq in 100 mL IVPB   atenolol (TENORMIN) tablet 50 mg   rosuvastatin (CRESTOR) tablet 20 mg   PARoxetine (PAXIL) tablet 40 mg   levothyroxine (SYNTHROID) tablet 50 mcg   heparin injection 5,000 Units   prochlorperazine (COMPAZINE) injection 5 mg    -I have reviewed the patients home medicines and have made adjustments as needed  Critical interventions Insulin drip, fluid resuscitation     Cardiac Monitoring: The patient was maintained on a cardiac monitor.  I personally viewed and interpreted the cardiac monitored which showed an underlying rhythm of: Sinus tachycardia  Social Determinants of Health:  Factors impacting patients care include: Ran out of home insulin   Reevaluation: After the interventions noted above, I reevaluated the patient and found that they have :improved  Co morbidities that complicate the patient evaluation  Past Medical History:  Diagnosis Date   Arthritis    bilateral knees and back;   Depression    on meds, working well   Diabetes mellitus without complication (HCC) 05/31/2012   on meds   Heart palpitations    rapid   Hyperlipidemia    on meds   Hypertension  on meds      Dispostion: I considered admission for this patient, and patient will require hospital admission for severe hyperglycemia     Final Clinical Impression(s) / ED Diagnoses Final diagnoses:  Hyperglycemia     @PCDICTATION @    Glendora Score, MD 07/19/23 2306

## 2023-07-19 NOTE — ED Notes (Signed)
 Patient accidentally ripped her IV out while trying to get out in the bed.

## 2023-07-20 DIAGNOSIS — R739 Hyperglycemia, unspecified: Secondary | ICD-10-CM | POA: Diagnosis not present

## 2023-07-20 DIAGNOSIS — N9089 Other specified noninflammatory disorders of vulva and perineum: Secondary | ICD-10-CM

## 2023-07-20 LAB — BETA-HYDROXYBUTYRIC ACID
Beta-Hydroxybutyric Acid: 0.24 mmol/L (ref 0.05–0.27)
Beta-Hydroxybutyric Acid: 0.07 mmol/L (ref 0.05–0.27)
Beta-Hydroxybutyric Acid: 0.52 mmol/L — ABNORMAL HIGH (ref 0.05–0.27)

## 2023-07-20 LAB — BASIC METABOLIC PANEL
Anion gap: 14 (ref 5–15)
Anion gap: 11 (ref 5–15)
Anion gap: 9 (ref 5–15)
BUN: 28 mg/dL — ABNORMAL HIGH (ref 8–23)
BUN: 29 mg/dL — ABNORMAL HIGH (ref 8–23)
BUN: 28 mg/dL — ABNORMAL HIGH (ref 8–23)
CO2: 21 mmol/L — ABNORMAL LOW (ref 22–32)
CO2: 27 mmol/L (ref 22–32)
CO2: 30 mmol/L (ref 22–32)
Calcium: 9 mg/dL (ref 8.9–10.3)
Calcium: 9.2 mg/dL (ref 8.9–10.3)
Calcium: 9.1 mg/dL (ref 8.9–10.3)
Chloride: 104 mmol/L (ref 98–111)
Chloride: 102 mmol/L (ref 98–111)
Chloride: 102 mmol/L (ref 98–111)
Creatinine, Ser: 1.39 mg/dL — ABNORMAL HIGH (ref 0.44–1.00)
Creatinine, Ser: 1.17 mg/dL — ABNORMAL HIGH (ref 0.44–1.00)
Creatinine, Ser: 1.14 mg/dL — ABNORMAL HIGH (ref 0.44–1.00)
GFR, Estimated: 41 mL/min — ABNORMAL LOW (ref >60)
GFR, Estimated: 50 mL/min — ABNORMAL LOW (ref >60)
GFR, Estimated: 51 mL/min — ABNORMAL LOW (ref >60)
Glucose, Bld: 389 mg/dL — ABNORMAL HIGH (ref 70–99)
Glucose, Bld: 183 mg/dL — ABNORMAL HIGH (ref 70–99)
Glucose, Bld: 145 mg/dL — ABNORMAL HIGH (ref 70–99)
Potassium: 3.4 mmol/L — ABNORMAL LOW (ref 3.5–5.1)
Potassium: 3.5 mmol/L (ref 3.5–5.1)
Potassium: 3.6 mmol/L (ref 3.5–5.1)
Sodium: 139 mmol/L (ref 135–145)
Sodium: 140 mmol/L (ref 135–145)
Sodium: 141 mmol/L (ref 135–145)

## 2023-07-20 LAB — GLUCOSE, CAPILLARY
Glucose-Capillary: 145 mg/dL — ABNORMAL HIGH (ref 70–99)
Glucose-Capillary: 158 mg/dL — ABNORMAL HIGH (ref 70–99)
Glucose-Capillary: 168 mg/dL — ABNORMAL HIGH (ref 70–99)
Glucose-Capillary: 170 mg/dL — ABNORMAL HIGH (ref 70–99)
Glucose-Capillary: 172 mg/dL — ABNORMAL HIGH (ref 70–99)
Glucose-Capillary: 179 mg/dL — ABNORMAL HIGH (ref 70–99)
Glucose-Capillary: 180 mg/dL — ABNORMAL HIGH (ref 70–99)
Glucose-Capillary: 190 mg/dL — ABNORMAL HIGH (ref 70–99)
Glucose-Capillary: 200 mg/dL — ABNORMAL HIGH (ref 70–99)
Glucose-Capillary: 207 mg/dL — ABNORMAL HIGH (ref 70–99)
Glucose-Capillary: 207 mg/dL — ABNORMAL HIGH (ref 70–99)
Glucose-Capillary: 212 mg/dL — ABNORMAL HIGH (ref 70–99)
Glucose-Capillary: 214 mg/dL — ABNORMAL HIGH (ref 70–99)
Glucose-Capillary: 240 mg/dL — ABNORMAL HIGH (ref 70–99)
Glucose-Capillary: 325 mg/dL — ABNORMAL HIGH (ref 70–99)
Glucose-Capillary: 450 mg/dL — ABNORMAL HIGH (ref 70–99)
Glucose-Capillary: 451 mg/dL — ABNORMAL HIGH (ref 70–99)
Glucose-Capillary: 470 mg/dL — ABNORMAL HIGH (ref 70–99)
Glucose-Capillary: 552 mg/dL (ref 70–99)
Glucose-Capillary: 590 mg/dL (ref 70–99)
Glucose-Capillary: >600 mg/dL (ref 70–99)

## 2023-07-20 LAB — MRSA NEXT GEN BY PCR, NASAL: MRSA by PCR Next Gen: NOT DETECTED

## 2023-07-20 MED ORDER — INSULIN ASPART PROT & ASPART (70-30 MIX) 100 UNIT/ML ~~LOC~~ SUSP
10.0000 [IU] | Freq: Two times a day (BID) | SUBCUTANEOUS | Status: DC
  Filled 2023-07-20: qty 10

## 2023-07-20 MED ORDER — SILVER NITRATE-POT NITRATE 75-25 % EX MISC
1.0000 | CUTANEOUS | Status: DC | PRN
  Filled 2023-07-20: qty 10

## 2023-07-20 MED ORDER — INSULIN ASPART 100 UNIT/ML IJ SOLN
0.0000 [IU] | INTRAMUSCULAR | Status: DC
  Administered 2023-07-20: 3 [IU] via SUBCUTANEOUS
  Administered 2023-07-21 (×2): 2 [IU] via SUBCUTANEOUS

## 2023-07-20 MED ORDER — INSULIN ASPART PROT & ASPART (70-30 MIX) 100 UNIT/ML ~~LOC~~ SUSP
10.0000 [IU] | Freq: Two times a day (BID) | SUBCUTANEOUS | Status: DC
  Administered 2023-07-20 – 2023-07-22 (×5): 10 [IU] via SUBCUTANEOUS
  Filled 2023-07-20: qty 10

## 2023-07-20 MED ORDER — CHLORHEXIDINE GLUCONATE CLOTH 2 % EX PADS
6.0000 | MEDICATED_PAD | Freq: Every day | CUTANEOUS | Status: DC
  Administered 2023-07-21 – 2023-07-23 (×3): 6 via TOPICAL

## 2023-07-20 MED ORDER — LIDOCAINE HCL (PF) 2 % IJ SOLN
2.0000 mL | Freq: Once | INTRAMUSCULAR | Status: AC
Start: 2023-07-20 — End: 2023-07-20
  Administered 2023-07-20: 2 mL via INTRADERMAL
  Filled 2023-07-20: qty 2

## 2023-07-20 MED ORDER — SODIUM CHLORIDE 0.9 % IV SOLN
3.0000 g | Freq: Three times a day (TID) | INTRAVENOUS | Status: DC
  Administered 2023-07-20 – 2023-07-23 (×10): 3 g via INTRAVENOUS
  Filled 2023-07-20 (×12): qty 8

## 2023-07-20 MED ORDER — LIDOCAINE-SODIUM BICARBONATE 1-8.4 % IJ SOSY: 1.0000 mL | PREFILLED_SYRINGE | INTRAMUSCULAR | Status: DC | PRN

## 2023-07-20 NOTE — Progress Notes (Signed)
 PROGRESS NOTE    Jennifer Ingram  XBJ:[id number] DOB: 1950/06/04 DOA: 07/19/2023 PCP: Rudene Christians, DO    Brief Narrative: 73 year old female with history of type 2 diabetes depression CKD stage IIIb hypertension hyperlipidemia admitted with DKA with initial blood glucose of 1121 as she ran out of insulin.  She is admitted for the same diagnosis.  She was started on insulin drip.  Initial anion gap was 23.  Assessment & Plan:   Principal Problem:   DKA (diabetic ketoacidosis) (HCC) Active Problems:   CKD stage 3b, GFR 30-44 ml/min (HCC)   Hypertension   Hyperlipidemia   Depression   Hypothyroid   Acute encephalopathy  #1 DKA patient followed by teaching service at [facility].  She ran out of insulin and she came in with hyperglycemia was found to be in DKA.  Main complaints were nausea and vomiting.  Hemoglobin A1c last 1 is 7.1 from June 2024.  A1c this admission is pending.  Diabetic coordinator consult is pending.  When I saw her this morning she was not nauseated she wanted to eat and I was about to turn off the drip and start her on long-acting insulin when she started to throw up again.  Will continue the drip till she is able to take p.o.  Labs stabilizing. gap Closed.  #2 right labial growth pictures in the media noted discussed with GYN on-call they will come by to see patient for possible biopsy.  To rule out malignancy.  She is not sure how long she has that lesion there.  It is not tender or painful.  #3 acute metabolic encephalopathy resolved.  #4.  History of essential hypertension on atenolol at home continue.  #5.  History of hyperlipidemia on Crestor last lipid profile from 2022 noted  will repeat this admission.  #6.  Depression on Paxil  #7.  Hypothyroidism on Synthroid last TSH 3.7 on September 2024    Estimated body mass index is 27.13 kg/m as calculated from the following:   Height as of this encounter: 5' (1.524 m).   Weight as of this encounter: 63 kg.  DVT  prophylaxis: Lovenox  code Status: Full code Family Communication: None  disposition Plan:  Status is: Inpatient Remains inpatient appropriate because: Acute illness   Consultants:  GYN  Procedures: None Antimicrobials: Anti-infectives (From admission, onward)    Start     Dose/Rate Route Frequency Ordered Stop   07/20/23 0445  Ampicillin-Sulbactam (UNASYN) 3 g in sodium chloride 0.9 % 100 mL IVPB        3 g 200 mL/hr over 30 Minutes Intravenous Every 8 hours 07/20/23 0350          Subjective:  Resting in bed Denied nausea when I was rounding later she started vomiting Objective: Vitals:   07/20/23 0900 07/20/23 1000 07/20/23 1109 07/20/23 1200  BP: (!) 111/53 107/73 116/63   Pulse: 63 62 65   Resp: 12 18    Temp:   98.7 F (37.1 C) 98.2 F (36.8 C)  TempSrc:   Oral Oral  SpO2: 93% 97%    Weight:      Height:        Intake/Output Summary (Last 24 hours) at 07/20/2023 1311 Last data filed at 07/20/2023 1142 Gross per 24 hour  Intake 2570.24 ml  Output 100 ml  Net 2470.24 ml   Filed Weights   07/19/23 2002 07/20/23 0015  Weight: 65.8 kg 63 kg    Examination:  General exam: Appears calm  and comfortable  Respiratory system: Clear to auscultation. Respiratory effort normal. Cardiovascular system: S1 & S2 heard, RRR. No JVD, murmurs, rubs, gallops or clicks. No pedal edema. Gastrointestinal system: Abdomen is nondistended, soft and nontender. No organomegaly or masses felt. Normal bowel sounds heard. Central nervous system: Alert and oriented. No focal neurological deficits. Extremities: Symmetric 5 x 5 power. Skin: No rashes, lesions or ulcers Psychiatry: Judgement and insight appear normal. Mood & affect appropriate.     Data Reviewed: I have personally reviewed following labs and imaging studies  CBC: Recent Labs  Lab 07/19/23 2027 07/19/23 2145  WBC 16.3*  --   NEUTROABS 14.8*  --   HGB 15.9* 15.6*  HCT 48.4* 46.0  MCV 94.2  --   PLT 321  --     Basic Metabolic Panel: Recent Labs  Lab 07/19/23 2027 07/19/23 2145 07/20/23 0300 07/20/23 0622 07/20/23 1053  NA 124*  128* 130* 139 140 141  K 4.4  4.4 4.4 3.4* 3.5 3.6  CL 86*  87* 94* 104 102 102  CO2 18*  18*  --  21* 27 30  GLUCOSE 1,109*  1,121* >700* 389* 183* 145*  BUN 29*  30* 29* 28* 29* 28*  CREATININE 1.52*  1.54* 1.20* 1.39* 1.17* 1.14*  CALCIUM 9.2  9.2  --  9.0 9.2 9.1   GFR: Estimated Creatinine Clearance: 37.5 mL/min (A) (by C-G formula based on SCr of 1.14 mg/dL (H)). Liver Function Tests: Recent Labs  Lab 07/19/23 2027  AST 15  ALT 10  ALKPHOS 118  BILITOT 1.4*  PROT 8.0  ALBUMIN 4.1   No results for input(s): "LIPASE", "AMYLASE" in the last 168 hours. No results for input(s): "AMMONIA" in the last 168 hours. Coagulation Profile: No results for input(s): "INR", "PROTIME" in the last 168 hours. Cardiac Enzymes: No results for input(s): "CKTOTAL", "CKMB", "CKMBINDEX", "TROPONINI" in the last 168 hours. BNP (last 3 results) No results for input(s): "PROBNP" in the last 8760 hours. HbA1C: No results for input(s): "HGBA1C" in the last 72 hours. CBG: Recent Labs  Lab 07/20/23 0813 07/20/23 0905 07/20/23 1002 07/20/23 1107 07/20/23 1134  GLUCAP 180* 207* 190* 172* 170*   Lipid Profile: No results for input(s): "CHOL", "HDL", "LDLCALC", "TRIG", "CHOLHDL", "LDLDIRECT" in the last 72 hours. Thyroid Function Tests: No results for input(s): "TSH", "T4TOTAL", "FREET4", "T3FREE", "THYROIDAB" in the last 72 hours. Anemia Panel: No results for input(s): "VITAMINB12", "FOLATE", "FERRITIN", "TIBC", "IRON", "RETICCTPCT" in the last 72 hours. Sepsis Labs: No results for input(s): "PROCALCITON", "LATICACIDVEN" in the last 168 hours.  Recent Results (from the past 240 hours)  MRSA Next Gen by PCR, Nasal     Status: None   Collection Time: 07/20/23 12:12 AM   Specimen: Nasal Mucosa; Nasal Swab  Result Value Ref Range Status   MRSA by PCR Next  Gen NOT DETECTED NOT DETECTED Final    Comment: (NOTE) The GeneXpert MRSA Assay (FDA approved for NASAL specimens only), is one component of a comprehensive MRSA colonization surveillance program. It is not intended to diagnose MRSA infection nor to guide or monitor treatment for MRSA infections. Test performance is not FDA approved in patients less than 5 years old. Performed at [facility], [address] [place], [place] [address]     Radiology Studies: No results found.  Scheduled Meds:  atenolol  50 mg Oral BID   Chlorhexidine Gluconate Cloth  6 each Topical Daily   heparin  5,000 Units Subcutaneous Q8H  insulin aspart protamine- aspart  10 Units Subcutaneous BID WC   levothyroxine  50 mcg Oral Q0600   PARoxetine  40 mg Oral Daily   rosuvastatin  20 mg Oral Daily   Continuous Infusions:  ampicillin-sulbactam (UNASYN) IV 3 g (07/20/23 1230)   dextrose 5% lactated ringers 125 mL/hr at 07/20/23 0700   insulin 7 mL/hr at 07/20/23 0700   lactated ringers 125 mL/hr at 07/20/23 0700     LOS: 1 day    Time spent: 39 min  Alwyn Ren, MD  07/20/2023, 1:11 PM

## 2023-07-20 NOTE — Consult Note (Addendum)
 Gynecologic Oncology Consultation  Jennifer Ingram 73 y.o. female  CC:  Chief Complaint  Patient presents with   Hyperglycemia   Vomiting    HPI: Jennifer Ingram is a 73 year old female who presented to the [facility] ER on 07/19/2023 by EMS with emesis, weakness, disorientation. She has a hx of T2DM insulin dependent and ran out of her insulin for several days. Labs in the ER included CBG >600, Cmet with Na+ 128, glucose 1,121, creatinine 1.54, WBC 16.3, Hgb 15.9, Hct 48.4, pCO2 34, beta hydroxybutyric acid 4.37, UA with moderate hemoglobin/ glucose >=500/ ketones/ trace leukocytes. No imaging performed. She was started on an insulin drip, IVF resuscitation, K+. A wound on her vulva was noted overnight by nursing staff and she was started on unasyn prophylactically. Today she underwent a vulvar biopsy by Dr. Tinnie Gens with GYN.     Medical history includes T2DM insulin dependent, CKD 3, hypothyroidism, arthritis, depression, HTN, hyperlipidemia.  Surgical history includes appendectomy, c section, cholecystectomy, tonsillectomy. Family history includes mother who passed with leukemia, father who has passed with lung cancer.   Interval History:  Patient states she came into the hospital due to vertigo. She ran out of insulin for several days and became more dizzy at home. Bowels are moving without difficulty. No nausea or emesis. Voiding with frequency at times. No pain on the vulva. "Been awhile" since she has been to a gynecologist. Reports no history of abnormal pap smears or procedures needed on her cervix. Menopause in her 78s. Denies post-menopausal bleeding. Reports having a good appetite. She lives with her son. No tobacco or alcohol use. G1P1.   Review of Systems: See interval. No vulvar symptoms reported.   Current Meds: Current inpatient and outpatient meds reviewed.   Allergy: No Known Allergies  Social Hx:   Social History   Socioeconomic History   Marital status: Widowed    Spouse  name: Not on file   Number of children: 1   Years of education: 18   Highest education level: 12th grade  Occupational History   Not on file  Tobacco Use   Smoking status: Never   Smokeless tobacco: Never  Vaping Use   Vaping status: Never Used  Substance and Sexual Activity   Alcohol use: No   Drug use: No   Sexual activity: Yes  Other Topics Concern   Not on file  Social History Narrative   Not on file   Social Drivers of Health   Financial Resource Strain: Not on file  Food Insecurity: No Food Insecurity (07/20/2023)   Hunger Vital Sign    Worried About Running Out of Food in the Last Year: Never true    Ran Out of Food in the Last Year: Never true  Transportation Needs: No Transportation Needs (07/20/2023)   PRAPARE - Administrator, Civil Service (Medical): No    Lack of Transportation (Non-Medical): No  Physical Activity: Sufficiently Active (10/10/2022)   Exercise Vital Sign    Days of Exercise per Week: 3 days    Minutes of Exercise per Session: 60 min  Stress: No Stress Concern Present (10/10/2022)   [employer] of Occupational Health - Occupational Stress Questionnaire    Feeling of Stress : Not at all  Social Connections: Moderately Integrated (07/20/2023)   Social Connection and Isolation Panel [NHANES]    Frequency of Communication with Friends and Family: More than three times a week    Frequency of Social Gatherings with Friends  and Family: Once a week    Attends Religious Services: 1 to 4 times per year    Active Member of Clubs or Organizations: No    Attends Banker Meetings: 1 to 4 times per year    Marital Status: Widowed  Intimate Partner Violence: Not At Risk (07/20/2023)   Humiliation, Afraid, Rape, and Kick questionnaire    Fear of Current or Ex-Partner: No    Emotionally Abused: No    Physically Abused: No    Sexually Abused: No    Past Surgical Hx:  Past Surgical History:  Procedure Laterality Date    APPENDECTOMY  1968   CESAREAN SECTION  1980   CHOLECYSTECTOMY  1985   TONSILLECTOMY  1960    Past Medical Hx:  Past Medical History:  Diagnosis Date   Arthritis    bilateral knees and back;   Depression    on meds, working well   Diabetes mellitus without complication (HCC) 05/31/2012   on meds   Heart palpitations    rapid   Hyperlipidemia    on meds   Hypertension    on meds    Family Hx:  Family History  Problem Relation Age of Onset   Cancer Mother        leukemia   Cancer Father        lung   Breast cancer Neg Hx    Colon polyps Neg Hx    Crohn's disease Neg Hx    Esophageal cancer Neg Hx    Stomach cancer Neg Hx    Rectal cancer Neg Hx     Vitals:  Blood pressure (!) 111/53, pulse 63, temperature 98.9 F (37.2 C), temperature source Oral, resp. rate 12, height 5' (1.524 m), weight 138 lb 14.2 oz (63 kg), SpO2 93%.  Physical Exam:  Alert, in no acute distress, comfortable resting in bed, answering questions appropriately Lungs clear. Heart regular in rate and rhythm Abdomen is soft with active bowel sounds. Scattered scarring from previous surgeries, non tender.  Performed by Dr. Pricilla Holm: Shotty inguinal lymph node palpated on the left.  On vulvar examination, vulvar lesion measuring 10 cm. Minimal swelling of the right vulva with mild induration and mild erythema. No obvious fluctuance. No significant tenderness, mild reported with palpation. Majority of affected area has desquamation vs ulceration. Exam is limited by what appears to be long hx of lack of care to this wound and possibly old matted hair with stool vs partially detached vulvar tissue.See pictures in media. Two additional satellite lesions measuring 1 cm each along the left anterior vulva/mons. No lower extremity edema bilaterally  Assessment/Plan:  73 year old female currently admitted with DKA, acute encephalopathy with a vulvar wound noted on examination. Vulvar biopsy taken today by Dr. Shawnie Pons  with GYN. Will follow results. Recommendation for CT AP scan to further evaluate although exam findings not consistent with a necrotizing infection given findings and lack of pain. Imaging will help rule this out. Continue with current plan of care.      Doylene Bode, NP 07/20/2023, 10:10 AM

## 2023-07-20 NOTE — Plan of Care (Signed)
  Problem: Cardiac: Goal: Ability to maintain an adequate cardiac output will improve Outcome: Progressing   Problem: Health Behavior/Discharge Planning: Goal: Ability to identify and utilize available resources and services will improve Outcome: Progressing Goal: Ability to manage health-related needs will improve Outcome: Progressing

## 2023-07-20 NOTE — Inpatient Diabetes Management (Signed)
 Inpatient Diabetes Program Recommendations  AACE/ADA: New Consensus Statement on Inpatient Glycemic Control (2015)  Target Ranges:  Prepandial:   less than 140 mg/dL      Peak postprandial:   less than 180 mg/dL (1-2 hours)      Critically ill patients:  140 - 180 mg/dL   Lab Results  Component Value Date   GLUCAP 170 (H) 07/20/2023   HGBA1C 7.1 (A) 10/10/2022    Review of Glycemic Control  Diabetes history: DM1 Outpatient Diabetes medications: 70/30 10 units in am and 6 units QPM Current orders for Inpatient glycemic control: IV insulin per [employer] for DKA transitioning to 70/30 10 units BID  Hgb1C - 7.1% on 10/23/2023 CHO-mod diet CBGs trending well  Inpatient Diabetes Program Recommendations:    Consider adding Novolog 0-9 units TID with meals and 0-5 HS  Spoke with RN at bedside. Pt is confused and inappropriate to speak with her about her diabetes control and DKA.  Will f/u on Monday 3/24.  Thank you. Ailene Ards, RD, LDN, CDCES Inpatient Diabetes Coordinator [phone]

## 2023-07-20 NOTE — Progress Notes (Signed)
 Pharmacy Antibiotic Note  Jennifer Ingram is a 73 y.o. female admitted on 07/19/2023 with Wound on right labia majora open with scant bleeding .  Pharmacy has been consulted for unasyn dosing.  Plan: Unasyn 3gm IV q8h Follow renal function and clinical course  Height: 5' (152.4 cm) Weight: 63 kg (138 lb 14.2 oz) IBW/kg (Calculated) : 45.5  Temp (24hrs), Avg:98.4 F (36.9 C), Min:98.1 F (36.7 C), Max:98.9 F (37.2 C)  Recent Labs  Lab 07/19/23 2027 07/19/23 2145 07/20/23 0300  WBC 16.3*  --   --   CREATININE 1.52*  1.54* 1.20* 1.39*    Estimated Creatinine Clearance: 30.8 mL/min (A) (by C-G formula based on SCr of 1.39 mg/dL (H)).    No Known Allergies   Thank you for allowing pharmacy to be a part of this patient's care. Arley Phenix RPh 07/20/2023, 3:52 AM

## 2023-07-20 NOTE — Progress Notes (Signed)
     Patient Name: Jennifer Ingram           DOB: [birth year]  MRN: [id number]      Admission Date: 07/19/2023  Attending Provider: Briscoe Deutscher, MD  Primary Diagnosis: DKA (diabetic ketoacidosis) (HCC)   Level of care: Stepdown   OVERNIGHT PROGRESS REPORT   Nursing staff concerned regarding wound/abscess found on right labia majora.   Wound on right labia majora open with scant bleeding. Large abscess- like growth also found on labia majora that may need drainage. Patient denies pain or discomfort to wound.  She was unaware she had such problem.     WBC 16.3, afebrile.  Patient started on Unasyn prophylactically.    Anthoney Harada, DNP, ACNPC- AG [facility]

## 2023-07-20 NOTE — Consult Note (Signed)
 WOC Nurse Consult Note: Reason for Consult: mons and R labial wounds  Wound type: full thickness unknown etiology  Pressure Injury POA: NA  Measurement: see nursing flowsheet  Wound bed: has 2 areas mons and 1 to L labia that are 100% red hemorrhagic ?growths; full thickness wound R atypical in appearance appears 50% red moist most distal portion dark hemorrhagic material  Drainage (amount, consistency, odor) per nursing flowsheet ; scant bleeding per MD note  Periwound: appears slightly macerated, peeling wound edges  Dressing procedure/placement/frequency:  Cleanse growths to mons and L labia with Vashe wound cleanser Hart [place] [phone]) and apply Xeroform gauze Hart [place] [phone]) cut to fit wound beds daily, cleanse R labial wound with Vashe and apply Vashe moistened gauze to area daily.  Cover with dry gauze and ABD pad, would use mesh underwear to attempt to secure dressing.   These areas are atypical and warrant further evaluation by surgery/?dermatology.   POC discussed with bedside nurse WOC team will not follow Re-consult if further needs arise.   Thank you,    Priscella Mann MSN, RN-BC, [employer] [phone]

## 2023-07-20 NOTE — Procedures (Signed)
 Patient identified, informed consent signed, copy in chart, time out performed.    Area cleansed with Alcohol.  Injected with 2% Lidocaine.  3 mL. Area cleaned with Betadine and 4 mm punch biopsy performed without difficulty.  Hemostasis obtained with Silver Nitrate.  Patient tolerated procedure well.

## 2023-07-21 ENCOUNTER — Inpatient Hospital Stay (HOSPITAL_COMMUNITY)

## 2023-07-21 DIAGNOSIS — R739 Hyperglycemia, unspecified: Secondary | ICD-10-CM | POA: Diagnosis not present

## 2023-07-21 LAB — CBC
HCT: 45.4 % (ref 36.0–46.0)
Hemoglobin: 15.2 g/dL — ABNORMAL HIGH (ref 12.0–15.0)
MCH: 30.8 pg (ref 26.0–34.0)
MCHC: 33.5 g/dL (ref 30.0–36.0)
MCV: 92.1 fL (ref 80.0–100.0)
Platelets: 317 10*3/uL (ref 150–400)
RBC: 4.93 MIL/uL (ref 3.87–5.11)
RDW: 13 % (ref 11.5–15.5)
WBC: 17.2 10*3/uL — ABNORMAL HIGH (ref 4.0–10.5)
nRBC: 0 % (ref 0.0–0.2)

## 2023-07-21 LAB — GLUCOSE, CAPILLARY
Glucose-Capillary: 107 mg/dL — ABNORMAL HIGH (ref 70–99)
Glucose-Capillary: 133 mg/dL — ABNORMAL HIGH (ref 70–99)
Glucose-Capillary: 140 mg/dL — ABNORMAL HIGH (ref 70–99)
Glucose-Capillary: 146 mg/dL — ABNORMAL HIGH (ref 70–99)
Glucose-Capillary: 147 mg/dL — ABNORMAL HIGH (ref 70–99)
Glucose-Capillary: 179 mg/dL — ABNORMAL HIGH (ref 70–99)
Glucose-Capillary: 68 mg/dL — ABNORMAL LOW (ref 70–99)
Glucose-Capillary: 81 mg/dL (ref 70–99)
Glucose-Capillary: 90 mg/dL (ref 70–99)

## 2023-07-21 LAB — LIPID PANEL
Cholesterol: 172 mg/dL (ref 0–200)
HDL: 33 mg/dL — ABNORMAL LOW (ref >40)
LDL Cholesterol: 100 mg/dL — ABNORMAL HIGH (ref 0–99)
Total CHOL/HDL Ratio: 5.2 ratio
Triglycerides: 196 mg/dL — ABNORMAL HIGH (ref <150)
VLDL: 39 mg/dL (ref 0–40)

## 2023-07-21 LAB — COMPREHENSIVE METABOLIC PANEL
ALT: 9 U/L (ref 0–44)
AST: 21 U/L (ref 15–41)
Albumin: 3.3 g/dL — ABNORMAL LOW (ref 3.5–5.0)
Alkaline Phosphatase: 85 U/L (ref 38–126)
Anion gap: 10 (ref 5–15)
BUN: 27 mg/dL — ABNORMAL HIGH (ref 8–23)
CO2: 25 mmol/L (ref 22–32)
Calcium: 8.8 mg/dL — ABNORMAL LOW (ref 8.9–10.3)
Chloride: 106 mmol/L (ref 98–111)
Creatinine, Ser: 1.01 mg/dL — ABNORMAL HIGH (ref 0.44–1.00)
GFR, Estimated: 60 mL/min — ABNORMAL LOW (ref >60)
Glucose, Bld: 96 mg/dL (ref 70–99)
Potassium: 3.3 mmol/L — ABNORMAL LOW (ref 3.5–5.1)
Sodium: 141 mmol/L (ref 135–145)
Total Bilirubin: 0.6 mg/dL (ref 0.0–1.2)
Total Protein: 6.7 g/dL (ref 6.5–8.1)

## 2023-07-21 LAB — LACTIC ACID, PLASMA: Lactic Acid, Venous: 1.4 mmol/L (ref 0.5–1.9)

## 2023-07-21 MED ORDER — SODIUM CHLORIDE (PF) 0.9 % IJ SOLN
INTRAMUSCULAR | Status: AC
  Filled 2023-07-21: qty 50

## 2023-07-21 MED ORDER — ALUM & MAG HYDROXIDE-SIMETH 200-200-20 MG/5ML PO SUSP
30.0000 mL | ORAL | Status: DC | PRN
  Administered 2023-07-21 – 2023-07-22 (×2): 30 mL via ORAL
  Filled 2023-07-21 (×2): qty 30

## 2023-07-21 MED ORDER — INSULIN ASPART 100 UNIT/ML IJ SOLN
0.0000 [IU] | Freq: Three times a day (TID) | INTRAMUSCULAR | Status: DC
  Administered 2023-07-21: 1 [IU] via SUBCUTANEOUS
  Administered 2023-07-22 (×2): 2 [IU] via SUBCUTANEOUS
  Administered 2023-07-22: 7 [IU] via SUBCUTANEOUS
  Administered 2023-07-23: 3 [IU] via SUBCUTANEOUS
  Administered 2023-07-23: 2 [IU] via SUBCUTANEOUS

## 2023-07-21 MED ORDER — IOHEXOL 300 MG/ML  SOLN
100.0000 mL | Freq: Once | INTRAMUSCULAR | Status: AC | PRN
  Administered 2023-07-21: 100 mL via INTRAVENOUS

## 2023-07-21 NOTE — Plan of Care (Signed)
  Problem: Skin Integrity: Goal: Risk for impaired skin integrity will decrease Outcome: Not Progressing   Problem: Education: Goal: Ability to describe self-care measures that may prevent or decrease complications (Diabetes Survival Skills Education) will improve Outcome: Not Progressing   Problem: Metabolic: Goal: Ability to maintain appropriate glucose levels will improve Outcome: Not Progressing   Problem: Nutritional: Goal: Maintenance of adequate nutrition will improve Outcome: Not Progressing   Problem: Urinary Elimination: Goal: Ability to achieve and maintain adequate renal perfusion and functioning will improve Outcome: Not Progressing

## 2023-07-21 NOTE — Plan of Care (Signed)
  Problem: Coping: Goal: Ability to adjust to condition or change in health will improve Outcome: Progressing   Problem: Nutritional: Goal: Maintenance of adequate nutrition will improve Outcome: Progressing Goal: Progress toward achieving an optimal weight will improve Outcome: Progressing   Problem: Pain Managment: Goal: General experience of comfort will improve and/or be controlled Outcome: Progressing

## 2023-07-21 NOTE — TOC Initial Note (Signed)
 Transition of Care [facility]) - Initial/Assessment Note    Patient Details  Name: Jennifer Ingram MRN: [id number] Date of Birth: [birth year]  Transition of [facility]) CM/SW Contact:    Diona Browner, LCSW Phone Number: 07/21/2023, 9:13 AM  Clinical Narrative:                 Pt from home with adult children. Pt continues medical workup. TOC following for d/c needs.    Barriers to Discharge: Continued Medical Work up   Patient Goals and CMS Choice Patient states their goals for this hospitalization and ongoing recovery are:: return home CMS Medicare.gov Compare Post Acute Care list provided to::  (NA) Choice offered to / list presented to : NA [place] ownership interest in [facility].provided to::  (NA)    Expected Discharge Plan and Services In-house Referral: NA     Living arrangements for the past 2 months: Single Family Home                 DME Arranged: N/A DME Agency: NA       HH Arranged: NA HH Agency: NA        Prior Living Arrangements/Services Living arrangements for the past 2 months: Single Family Home Lives with:: Adult Children Patient language and need for interpreter reviewed:: Yes Do you feel safe going back to the place where you live?: Yes      Need for Family Participation in Patient Care: Yes (Comment) Care giver support system in place?: Yes (comment)   Criminal Activity/Legal Involvement Pertinent to Current Situation/Hospitalization: No - Comment as needed  Activities of Daily Living   ADL Screening (condition at time of admission) Independently performs ADLs?: No Does the patient have a NEW difficulty with bathing/dressing/toileting/self-feeding that is expected to last >3 days?: No Does the patient have a NEW difficulty with getting in/out of bed, walking, or climbing stairs that is expected to last >3 days?: No Does the patient have a NEW difficulty with communication that is expected to last >3 days?: No Is the patient deaf  or have difficulty hearing?: No Does the patient have difficulty seeing, even when wearing glasses/contacts?: No Does the patient have difficulty concentrating, remembering, or making decisions?: No  Permission Sought/Granted                  Emotional Assessment Appearance:: Appears stated age, Appears older than stated age Attitude/Demeanor/Rapport: Engaged Affect (typically observed): Accepting Orientation: : Oriented to Self, Oriented to Place, Oriented to  Time, Oriented to Situation Alcohol / Substance Use: Not Applicable Psych Involvement: No (comment)  Admission diagnosis:  DKA (diabetic ketoacidosis) (HCC) [E11.10] Hyperglycemia [R73.9] Patient Active Problem List   Diagnosis Date Noted   Hyperglycemia 07/20/2023   DKA (diabetic ketoacidosis) (HCC) 07/19/2023   Acute encephalopathy 07/19/2023   Unsteady gait 01/05/2023   Orthostatic hypotension 01/04/2023   Hypokalemia 01/04/2023   CKD stage 3b, GFR 30-44 ml/min (HCC) 01/04/2023   Leukocytosis 01/04/2023   Type 2 diabetes mellitus with complication, with long-term current use of insulin (HCC) 01/04/2023   Systolic murmur 10/10/2022   Diminished pulses in lower extremity 11/30/2021   Pain due to onychomycosis of toenails of both feet 02/14/2021   Chronic kidney disease 12/20/2020   Depression 12/17/2020   Diabetes mellitus with polyneuropathy (HCC) 12/17/2020   Heart palpitations 12/17/2020   Hypothyroid 12/17/2020   Hypertension 12/17/2020   Hyperlipidemia 12/17/2020   Healthcare maintenance 12/17/2020   Intertrigo 12/17/2020   PCP:  Masters, Psychiatric nurse, DO Pharmacy:   [facility] [address] [place] - [address] [place] [address] Phone: [phone] Fax: [phone]     Social Drivers of Health (SDOH) Social History: SDOH Screenings   Food Insecurity: No Food Insecurity (07/20/2023)  Housing: Low Risk  (07/20/2023)  Transportation Needs: No Transportation  Needs (07/20/2023)  Utilities: Not At Risk (07/20/2023)  Alcohol Screen: Low Risk  (10/10/2022)  Depression (PHQ2-9): Low Risk  (10/10/2022)  Physical Activity: Sufficiently Active (10/10/2022)  Social Connections: Moderately Integrated (07/20/2023)  Stress: No Stress Concern Present (10/10/2022)  Tobacco Use: Low Risk  (07/19/2023)   SDOH Interventions:     Readmission Risk Interventions    07/21/2023    9:11 AM 01/06/2023    1:24 PM  Readmission Risk Prevention Plan  Transportation Screening Complete Complete  PCP or Specialist Appt within 5-7 Days Complete Complete  Home Care Screening Complete Complete  Medication Review (RN CM) Complete Complete

## 2023-07-21 NOTE — Progress Notes (Signed)
 PROGRESS NOTE    Jennifer Ingram  BJY:[id number] DOB: Sep 12, 1950 DOA: 07/19/2023 PCP: Rudene Christians, DO    Brief Narrative: 73 year old female with history of type 2 diabetes depression CKD stage IIIb hypertension hyperlipidemia admitted with DKA with initial blood glucose of 1121 as she ran out of insulin.  She is admitted for the same diagnosis.  She was started on insulin drip.  Initial anion gap was 23.  Assessment & Plan:   Principal Problem:   DKA (diabetic ketoacidosis) (HCC) Active Problems:   CKD stage 3b, GFR 30-44 ml/min (HCC)   Hypertension   Hyperlipidemia   Depression   Hypothyroid   Acute encephalopathy   Hyperglycemia  #1 DKA patient followed by teaching service at [facility].  She ran out of insulin and she came in with hyperglycemia was found to be in DKA.  Main complaints were nausea and vomiting.  Hemoglobin A1c last 1 is 7.1 from June 2024.  A1c this admission is pending. CBG (last 3)  Recent Labs    07/21/23 0423 07/21/23 0734 07/21/23 1145  GLUCAP 133* 147* 107*   Continue long-acting insulin. Had hypoglycemia Decrease coverage  0-9 SSI Transfer to MedSurg Ambulate out of bed  #2 right labial growth this post punch biopsy.  Appreciate GYN.  CT abdomen and pelvis shows no acute findings other than fatty liver. On antibiotics to cover infection/cellulitis.  #3 acute metabolic encephalopathy resolved.  #4.  History of essential hypertension on atenolol at home continue.  #5.  History of hyperlipidemia on Crestor last lipid profile from 2022 noted  will repeat this admission.  #6.  Depression on Paxil  #7.  Hypothyroidism on Synthroid last TSH 3.7 on September 2024    Estimated body mass index is 27.25 kg/m as calculated from the following:   Height as of this encounter: 5' (1.524 m).   Weight as of this encounter: 63.3 kg.  DVT prophylaxis: Lovenox  code Status: Full code Family Communication: None  disposition Plan:  Status is:  Inpatient Remains inpatient appropriate because: Acute illness   Consultants:  GYN  Procedures: None Antimicrobials: Anti-infectives (From admission, onward)    Start     Dose/Rate Route Frequency Ordered Stop   07/20/23 0445  Ampicillin-Sulbactam (UNASYN) 3 g in sodium chloride 0.9 % 100 mL IVPB        3 g 200 mL/hr over 30 Minutes Intravenous Every 8 hours 07/20/23 0350          Subjective:  Resting in bed Denied nausea when I was rounding later she started vomiting Objective: Vitals:   07/21/23 0926 07/21/23 1000 07/21/23 1213 07/21/23 1300  BP:  (!) 149/131 (!) 128/59   Pulse: 65 62  (!) 58  Resp: 12 19  14   Temp:      TempSrc:      SpO2: 96% 97%  93%  Weight:      Height:        Intake/Output Summary (Last 24 hours) at 07/21/2023 1346 Last data filed at 07/21/2023 0546 Gross per 24 hour  Intake 2341.87 ml  Output --  Net 2341.87 ml   Filed Weights   07/19/23 2002 07/20/23 0015 07/21/23 0500  Weight: 65.8 kg 63 kg 63.3 kg    Examination:  General exam: Appears no acute distress  respiratory system: Clear to auscultation. Respiratory effort normal. Cardiovascular system: S1 & S2 heard, RRR. No JVD, murmurs, rubs, gallops or clicks. No pedal edema. Gastrointestinal system: Abdomen is nondistended, soft and nontender. No  organomegaly or masses felt. Normal bowel sounds heard. Central nervous system: Alert and oriented. No focal neurological deficits. Extremities: In no edema   Data Reviewed: I have personally reviewed following labs and imaging studies  CBC: Recent Labs  Lab 07/19/23 2027 07/19/23 2145 07/21/23 0259  WBC 16.3*  --  17.2*  NEUTROABS 14.8*  --   --   HGB 15.9* 15.6* 15.2*  HCT 48.4* 46.0 45.4  MCV 94.2  --  92.1  PLT 321  --  317   Basic Metabolic Panel: Recent Labs  Lab 07/19/23 2027 07/19/23 2145 07/20/23 0300 07/20/23 0622 07/20/23 1053 07/21/23 0259  NA 124*  128* 130* 139 140 141 141  K 4.4  4.4 4.4 3.4* 3.5 3.6  3.3*  CL 86*  87* 94* 104 102 102 106  CO2 18*  18*  --  21* 27 30 25   GLUCOSE 1,109*  1,121* >700* 389* 183* 145* 96  BUN 29*  30* 29* 28* 29* 28* 27*  CREATININE 1.52*  1.54* 1.20* 1.39* 1.17* 1.14* 1.01*  CALCIUM 9.2  9.2  --  9.0 9.2 9.1 8.8*   GFR: Estimated Creatinine Clearance: 42.4 mL/min (A) (by C-G formula based on SCr of 1.01 mg/dL (H)). Liver Function Tests: Recent Labs  Lab 07/19/23 2027 07/21/23 0259  AST 15 21  ALT 10 9  ALKPHOS 118 85  BILITOT 1.4* 0.6  PROT 8.0 6.7  ALBUMIN 4.1 3.3*   No results for input(s): "LIPASE", "AMYLASE" in the last 168 hours. No results for input(s): "AMMONIA" in the last 168 hours. Coagulation Profile: No results for input(s): "INR", "PROTIME" in the last 168 hours. Cardiac Enzymes: No results for input(s): "CKTOTAL", "CKMB", "CKMBINDEX", "TROPONINI" in the last 168 hours. BNP (last 3 results) No results for input(s): "PROBNP" in the last 8760 hours. HbA1C: No results for input(s): "HGBA1C" in the last 72 hours. CBG: Recent Labs  Lab 07/21/23 0047 07/21/23 0159 07/21/23 0423 07/21/23 0734 07/21/23 1145  GLUCAP 81 90 133* 147* 107*   Lipid Profile: Recent Labs    07/21/23 0259  CHOL 172  HDL 33*  LDLCALC 100*  TRIG 196*  CHOLHDL 5.2   Thyroid Function Tests: No results for input(s): "TSH", "T4TOTAL", "FREET4", "T3FREE", "THYROIDAB" in the last 72 hours. Anemia Panel: No results for input(s): "VITAMINB12", "FOLATE", "FERRITIN", "TIBC", "IRON", "RETICCTPCT" in the last 72 hours. Sepsis Labs: Recent Labs  Lab 07/21/23 0259  LATICACIDVEN 1.4    Recent Results (from the past 240 hours)  MRSA Next Gen by PCR, Nasal     Status: None   Collection Time: 07/20/23 12:12 AM   Specimen: Nasal Mucosa; Nasal Swab  Result Value Ref Range Status   MRSA by PCR Next Gen NOT DETECTED NOT DETECTED Final    Comment: (NOTE) The GeneXpert MRSA Assay (FDA approved for NASAL specimens only), is one component of a  comprehensive MRSA colonization surveillance program. It is not intended to diagnose MRSA infection nor to guide or monitor treatment for MRSA infections. Test performance is not FDA approved in patients less than 69 years old. Performed at [facility], [address], [place], [place] [address]     Radiology Studies: CT ABDOMEN PELVIS W CONTRAST Result Date: 07/21/2023 CLINICAL DATA:  Vulvar pain, vulvar lesion/mass. Leukocytosis. Evaluate for metastatic disease. * Tracking Code: BO * EXAM: CT ABDOMEN AND PELVIS WITH CONTRAST TECHNIQUE: Multidetector CT imaging of the abdomen and pelvis was performed using the standard protocol following bolus administration of intravenous  contrast. RADIATION DOSE REDUCTION: This exam was performed according to the departmental dose-optimization program which includes automated exposure control, adjustment of the mA and/or kV according to patient size and/or use of iterative reconstruction technique. CONTRAST:  OMNIPAQUE IOHEXOL 300 MG/ML  SOLN COMPARISON:  12/28/2022. FINDINGS: Lower chest: Minimal dependent atelectasis. Atherosclerotic calcification of the aorta, aortic valve and coronary arteries. Heart is enlarged. No pericardial or pleural effusion. Hepatobiliary: Liver is decreased in attenuation diffusely but otherwise unremarkable. Cholecystectomy. No unexpected biliary ductal dilatation. Pancreas: Negative. Spleen: Negative. Adrenals/Urinary Tract: Adrenal glands and kidneys are unremarkable. Ureters are decompressed. Bladder is low in volume. Stomach/Bowel: Small hiatal hernia. Stomach, small bowel and colon are otherwise unremarkable. Appendix is not well-visualized. Vascular/Lymphatic: Atherosclerotic calcification of the aorta. No pathologically enlarged lymph nodes. Reproductive: Uterus is visualized.  No adnexal mass. Other: No free fluid. Tiny umbilical hernia contains fat. Moderate supraumbilical midline ventral hernia contains  fat. Mesenteries and peritoneum are unremarkable. Musculoskeletal: Degenerative changes in the spine. IMPRESSION: 1. No evidence of metastatic disease. 2. Hepatic steatosis. 3. Aortic atherosclerosis (ICD10-I70.0). Coronary artery calcification. Electronically Signed   By: Leanna Battles M.D.   On: 07/21/2023 13:05    Scheduled Meds:  atenolol  50 mg Oral BID   Chlorhexidine Gluconate Cloth  6 each Topical Daily   heparin  5,000 Units Subcutaneous Q8H   insulin aspart  0-9 Units Subcutaneous TID WC   insulin aspart protamine- aspart  10 Units Subcutaneous BID WC   levothyroxine  50 mcg Oral Q0600   PARoxetine  40 mg Oral Daily   rosuvastatin  20 mg Oral Daily   Continuous Infusions:  ampicillin-sulbactam (UNASYN) IV Stopped (07/21/23 1244)   insulin Stopped (07/20/23 2134)     LOS: 2 days    Time spent: 39 min  Alwyn Ren, MD  07/21/2023, 1:46 PM

## 2023-07-21 NOTE — Inpatient Diabetes Management (Signed)
 Inpatient Diabetes Program Recommendations  AACE/ADA: New Consensus Statement on Inpatient Glycemic Control   Target Ranges:  Prepandial:   less than 140 mg/dL      Peak postprandial:   less than 180 mg/dL (1-2 hours)      Critically ill patients:  140 - 180 mg/dL    Latest Reference Range & Units 07/21/23 00:26 07/21/23 00:47 07/21/23 01:59 07/21/23 04:23 07/21/23 07:34  Glucose-Capillary 70 - 99 mg/dL 68 (L) 81 90 409 (H) 811 (H)    Latest Reference Range & Units 07/20/23 11:07 07/20/23 11:34 07/20/23 13:49 07/20/23 14:49 07/20/23 16:02 07/20/23 18:35 07/20/23 20:27  Glucose-Capillary 70 - 99 mg/dL 914 (H) 782 (H) 956 (H) 200 (H) 168 (H) 158 (H) 179 (H)   Review of Glycemic Control  Diabetes history: DM1 Outpatient Diabetes medications: 70/30 10 units QAM, 70/30 6 units QPM Current orders for Inpatient glycemic control: 70/30 10 units BID, Novolog 0-15 units Q4H  Inpatient Diabetes Program Recommendations:    Insulin: CBG down to 68 mg/dl at 21:30 today (likely from moderate correction scale Q4H).  Please consider decreasing Novolog correction to 0-9 units and change to AC&HS.  Thanks, Orlando Penner, RN, MSN, CDCES Diabetes Coordinator Inpatient Diabetes Program [phone] (Team Pager from 8am to 5pm)

## 2023-07-22 DIAGNOSIS — R739 Hyperglycemia, unspecified: Secondary | ICD-10-CM | POA: Diagnosis not present

## 2023-07-22 LAB — GLUCOSE, CAPILLARY
Glucose-Capillary: 200 mg/dL — ABNORMAL HIGH (ref 70–99)
Glucose-Capillary: 311 mg/dL — ABNORMAL HIGH (ref 70–99)
Glucose-Capillary: 193 mg/dL — ABNORMAL HIGH (ref 70–99)
Glucose-Capillary: 154 mg/dL — ABNORMAL HIGH (ref 70–99)

## 2023-07-22 MED ORDER — ACETAMINOPHEN 500 MG PO TABS
500.0000 mg | ORAL_TABLET | Freq: Four times a day (QID) | ORAL | Status: DC | PRN
  Administered 2023-07-22 – 2023-07-23 (×3): 500 mg via ORAL
  Filled 2023-07-22 (×3): qty 1

## 2023-07-22 MED ORDER — INSULIN ASPART PROT & ASPART (70-30 MIX) 100 UNIT/ML ~~LOC~~ SUSP
10.0000 [IU] | Freq: Every day | SUBCUTANEOUS | Status: DC
  Administered 2023-07-23: 10 [IU] via SUBCUTANEOUS

## 2023-07-22 MED ORDER — INSULIN ASPART PROT & ASPART (70-30 MIX) 100 UNIT/ML ~~LOC~~ SUSP
14.0000 [IU] | Freq: Every day | SUBCUTANEOUS | Status: DC
  Administered 2023-07-22: 14 [IU] via SUBCUTANEOUS

## 2023-07-22 NOTE — Plan of Care (Signed)
  Problem: Coping: Goal: Ability to adjust to condition or change in health will improve Outcome: Progressing   Problem: Pain Managment: Goal: General experience of comfort will improve and/or be controlled Outcome: Progressing   Problem: Safety: Goal: Ability to remain free from injury will improve Outcome: Progressing

## 2023-07-22 NOTE — Progress Notes (Signed)
 PROGRESS NOTE    Jennifer Ingram  ZOX:[id number] DOB: 04-30-1951 DOA: 07/19/2023 PCP: Rudene Christians, DO    Brief Narrative: 73 year old female with history of type 2 diabetes depression CKD stage IIIb hypertension hyperlipidemia admitted with DKA with initial blood glucose of 1121 as she ran out of insulin.  She is admitted for the same diagnosis.  She was started on insulin drip.  Initial anion gap was 23.  Assessment & Plan:   Principal Problem:   DKA (diabetic ketoacidosis) (HCC) Active Problems:   CKD stage 3b, GFR 30-44 ml/min (HCC)   Hypertension   Hyperlipidemia   Depression   Hypothyroid   Acute encephalopathy   Hyperglycemia  #1 DKA patient followed by teaching service at [facility].  She ran out of insulin and she came in with hyperglycemia was found to be in DKA.  Main complaints were nausea and vomiting.  Hemoglobin A1c last 1 is 7.1 from June 2024.  CBG (last 3)  Recent Labs    07/21/23 2057 07/22/23 0720 07/22/23 1215  GLUCAP 146* 200* 311*  Increased  70/30 evening dose  #2 right labial growth this post punch biopsy.  Appreciate GYN.  CT abdomen and pelvis shows no acute findings other than fatty liver. On antibiotics to cover infection/cellulitis.  Await biopsy.  #3 acute metabolic encephalopathy resolved.  #4.  History of essential hypertension on atenolol at home continue.  #5.  History of hyperlipidemia on Crestor last lipid profile from 2022 noted  will repeat this admission.  #6.  Depression on Paxil  #7.  Hypothyroidism on Synthroid last TSH 3.7 on September 2024    Estimated body mass index is 27.25 kg/m as calculated from the following:   Height as of this encounter: 5' (1.524 m).   Weight as of this encounter: 63.3 kg.  DVT prophylaxis: Lovenox  code Status: Full code Family Communication: None  disposition Plan:  Status is: Inpatient Remains inpatient appropriate because: Acute illness   Consultants:  GYN  Procedures:  None Antimicrobials: Anti-infectives (From admission, onward)    Start     Dose/Rate Route Frequency Ordered Stop   07/20/23 0445  Ampicillin-Sulbactam (UNASYN) 3 g in sodium chloride 0.9 % 100 mL IVPB        3 g 200 mL/hr over 30 Minutes Intravenous Every 8 hours 07/20/23 0350          Subjective:  Patient is resting in bed  Complained of some pain at the site of biopsy  objective: Vitals:   07/21/23 2129 07/22/23 0115 07/22/23 0519 07/22/23 1016  BP: (!) 128/52 (!) 117/47 115/89 (!) 118/55  Pulse: 68 61 (!) 59 96  Resp:  16 15 16   Temp:  98.2 F (36.8 C) 97.6 F (36.4 C) 97.9 F (36.6 C)  TempSrc:  Oral Oral   SpO2:  97% 94% (!) 82%  Weight:      Height:        Intake/Output Summary (Last 24 hours) at 07/22/2023 1313 Last data filed at 07/22/2023 0900 Gross per 24 hour  Intake 773.1 ml  Output 150 ml  Net 623.1 ml   Filed Weights   07/19/23 2002 07/20/23 0015 07/21/23 0500  Weight: 65.8 kg 63 kg 63.3 kg    Examination:  General exam: Appears no acute distress  respiratory system: Clear to auscultation. Respiratory effort normal. Cardiovascular system: S1 & S2 heard, RRR. No JVD, murmurs, rubs, gallops or clicks. No pedal edema. Gastrointestinal system: Abdomen is nondistended, soft and nontender. No  organomegaly or masses felt. Normal bowel sounds heard. Central nervous system: Alert and oriented. No focal neurological deficits. Extremities: In no edema   Data Reviewed: I have personally reviewed following labs and imaging studies  CBC: Recent Labs  Lab 07/19/23 2027 07/19/23 2145 07/21/23 0259  WBC 16.3*  --  17.2*  NEUTROABS 14.8*  --   --   HGB 15.9* 15.6* 15.2*  HCT 48.4* 46.0 45.4  MCV 94.2  --  92.1  PLT 321  --  317   Basic Metabolic Panel: Recent Labs  Lab 07/19/23 2027 07/19/23 2145 07/20/23 0300 07/20/23 0622 07/20/23 1053 07/21/23 0259  NA 124*  128* 130* 139 140 141 141  K 4.4  4.4 4.4 3.4* 3.5 3.6 3.3*  CL 86*  87* 94*  104 102 102 106  CO2 18*  18*  --  21* 27 30 25   GLUCOSE 1,109*  1,121* >700* 389* 183* 145* 96  BUN 29*  30* 29* 28* 29* 28* 27*  CREATININE 1.52*  1.54* 1.20* 1.39* 1.17* 1.14* 1.01*  CALCIUM 9.2  9.2  --  9.0 9.2 9.1 8.8*   GFR: Estimated Creatinine Clearance: 42.4 mL/min (A) (by C-G formula based on SCr of 1.01 mg/dL (H)). Liver Function Tests: Recent Labs  Lab 07/19/23 2027 07/21/23 0259  AST 15 21  ALT 10 9  ALKPHOS 118 85  BILITOT 1.4* 0.6  PROT 8.0 6.7  ALBUMIN 4.1 3.3*   No results for input(s): "LIPASE", "AMYLASE" in the last 168 hours. No results for input(s): "AMMONIA" in the last 168 hours. Coagulation Profile: No results for input(s): "INR", "PROTIME" in the last 168 hours. Cardiac Enzymes: No results for input(s): "CKTOTAL", "CKMB", "CKMBINDEX", "TROPONINI" in the last 168 hours. BNP (last 3 results) No results for input(s): "PROBNP" in the last 8760 hours. HbA1C: No results for input(s): "HGBA1C" in the last 72 hours. CBG: Recent Labs  Lab 07/21/23 1604 07/21/23 1926 07/21/23 2057 07/22/23 0720 07/22/23 1215  GLUCAP 140* 179* 146* 200* 311*   Lipid Profile: Recent Labs    07/21/23 0259  CHOL 172  HDL 33*  LDLCALC 100*  TRIG 196*  CHOLHDL 5.2   Thyroid Function Tests: No results for input(s): "TSH", "T4TOTAL", "FREET4", "T3FREE", "THYROIDAB" in the last 72 hours. Anemia Panel: No results for input(s): "VITAMINB12", "FOLATE", "FERRITIN", "TIBC", "IRON", "RETICCTPCT" in the last 72 hours. Sepsis Labs: Recent Labs  Lab 07/21/23 0259  LATICACIDVEN 1.4    Recent Results (from the past 240 hours)  MRSA Next Gen by PCR, Nasal     Status: None   Collection Time: 07/20/23 12:12 AM   Specimen: Nasal Mucosa; Nasal Swab  Result Value Ref Range Status   MRSA by PCR Next Gen NOT DETECTED NOT DETECTED Final    Comment: (NOTE) The GeneXpert MRSA Assay (FDA approved for NASAL specimens only), is one component of a comprehensive MRSA  colonization surveillance program. It is not intended to diagnose MRSA infection nor to guide or monitor treatment for MRSA infections. Test performance is not FDA approved in patients less than 4 years old. Performed at [facility], [address], [place], [place] [address]     Radiology Studies: CT ABDOMEN PELVIS W CONTRAST Result Date: 07/21/2023 CLINICAL DATA:  Vulvar pain, vulvar lesion/mass. Leukocytosis. Evaluate for metastatic disease. * Tracking Code: BO * EXAM: CT ABDOMEN AND PELVIS WITH CONTRAST TECHNIQUE: Multidetector CT imaging of the abdomen and pelvis was performed using the standard protocol following bolus administration of intravenous  contrast. RADIATION DOSE REDUCTION: This exam was performed according to the departmental dose-optimization program which includes automated exposure control, adjustment of the mA and/or kV according to patient size and/or use of iterative reconstruction technique. CONTRAST:  OMNIPAQUE IOHEXOL 300 MG/ML  SOLN COMPARISON:  12/28/2022. FINDINGS: Lower chest: Minimal dependent atelectasis. Atherosclerotic calcification of the aorta, aortic valve and coronary arteries. Heart is enlarged. No pericardial or pleural effusion. Hepatobiliary: Liver is decreased in attenuation diffusely but otherwise unremarkable. Cholecystectomy. No unexpected biliary ductal dilatation. Pancreas: Negative. Spleen: Negative. Adrenals/Urinary Tract: Adrenal glands and kidneys are unremarkable. Ureters are decompressed. Bladder is low in volume. Stomach/Bowel: Small hiatal hernia. Stomach, small bowel and colon are otherwise unremarkable. Appendix is not well-visualized. Vascular/Lymphatic: Atherosclerotic calcification of the aorta. No pathologically enlarged lymph nodes. Reproductive: Uterus is visualized.  No adnexal mass. Other: No free fluid. Tiny umbilical hernia contains fat. Moderate supraumbilical midline ventral hernia contains fat. Mesenteries  and peritoneum are unremarkable. Musculoskeletal: Degenerative changes in the spine. IMPRESSION: 1. No evidence of metastatic disease. 2. Hepatic steatosis. 3. Aortic atherosclerosis (ICD10-I70.0). Coronary artery calcification. Electronically Signed   By: Leanna Battles M.D.   On: 07/21/2023 13:05    Scheduled Meds:  atenolol  50 mg Oral BID   Chlorhexidine Gluconate Cloth  6 each Topical Daily   heparin  5,000 Units Subcutaneous Q8H   insulin aspart  0-9 Units Subcutaneous TID WC   insulin aspart protamine- aspart  10 Units Subcutaneous BID WC   levothyroxine  50 mcg Oral Q0600   PARoxetine  40 mg Oral Daily   rosuvastatin  20 mg Oral Daily   Continuous Infusions:  ampicillin-sulbactam (UNASYN) IV 3 g (07/22/23 0532)     LOS: 3 days    Time spent: 39 min  Alwyn Ren, MD  07/22/2023, 1:13 PM

## 2023-07-22 NOTE — Plan of Care (Signed)
 Today patient has c/o pain to peri area following trips to the bathroom for which Tylenol was effective. Peri care and dressing changes done.  Problem: Coping: Goal: Ability to adjust to condition or change in health will improve Outcome: Progressing   Problem: Clinical Measurements: Goal: Ability to maintain clinical measurements within normal limits will improve Outcome: Progressing   Problem: Pain Managment: Goal: General experience of comfort will improve and/or be controlled Outcome: Progressing   Problem: Safety: Goal: Ability to remain free from injury will improve Outcome: Progressing   Problem: Skin Integrity: Goal: Risk for impaired skin integrity will decrease Outcome: Progressing   Haydee Salter, RN 07/22/23 4:02 PM

## 2023-07-23 ENCOUNTER — Other Ambulatory Visit (HOSPITAL_COMMUNITY): Payer: Self-pay

## 2023-07-23 DIAGNOSIS — R739 Hyperglycemia, unspecified: Secondary | ICD-10-CM | POA: Diagnosis not present

## 2023-07-23 LAB — CBC WITH DIFFERENTIAL/PLATELET
Abs Immature Granulocytes: 0.05 10*3/uL (ref 0.00–0.07)
Basophils Absolute: 0.1 10*3/uL (ref 0.0–0.1)
Basophils Relative: 1 %
Eosinophils Absolute: 0.5 10*3/uL (ref 0.0–0.5)
Eosinophils Relative: 4 %
HCT: 43.7 % (ref 36.0–46.0)
Hemoglobin: 14.1 g/dL (ref 12.0–15.0)
Immature Granulocytes: 0 %
Lymphocytes Relative: 26 %
Lymphs Abs: 3.1 10*3/uL (ref 0.7–4.0)
MCH: 30.3 pg (ref 26.0–34.0)
MCHC: 32.3 g/dL (ref 30.0–36.0)
MCV: 94 fL (ref 80.0–100.0)
Monocytes Absolute: 0.8 10*3/uL (ref 0.1–1.0)
Monocytes Relative: 7 %
Neutro Abs: 7.3 10*3/uL (ref 1.7–7.7)
Neutrophils Relative %: 62 %
Platelets: 268 10*3/uL (ref 150–400)
RBC: 4.65 MIL/uL (ref 3.87–5.11)
RDW: 12.8 % (ref 11.5–15.5)
WBC: 11.8 10*3/uL — ABNORMAL HIGH (ref 4.0–10.5)
nRBC: 0 % (ref 0.0–0.2)

## 2023-07-23 LAB — HEMOGLOBIN A1C
Hgb A1c MFr Bld: >15.5 % — ABNORMAL HIGH (ref 4.8–5.6)
Hgb A1c MFr Bld: >15.5 % — ABNORMAL HIGH (ref 4.8–5.6)
Mean Plasma Glucose: >398 mg/dL
Mean Plasma Glucose: >398 mg/dL

## 2023-07-23 LAB — BASIC METABOLIC PANEL
Anion gap: 10 (ref 5–15)
BUN: 23 mg/dL (ref 8–23)
CO2: 28 mmol/L (ref 22–32)
Calcium: 8.1 mg/dL — ABNORMAL LOW (ref 8.9–10.3)
Chloride: 103 mmol/L (ref 98–111)
Creatinine, Ser: 1.06 mg/dL — ABNORMAL HIGH (ref 0.44–1.00)
GFR, Estimated: 56 mL/min — ABNORMAL LOW (ref >60)
Glucose, Bld: 209 mg/dL — ABNORMAL HIGH (ref 70–99)
Potassium: 3.1 mmol/L — ABNORMAL LOW (ref 3.5–5.1)
Sodium: 141 mmol/L (ref 135–145)

## 2023-07-23 LAB — GLUCOSE, CAPILLARY
Glucose-Capillary: 169 mg/dL — ABNORMAL HIGH (ref 70–99)
Glucose-Capillary: 205 mg/dL — ABNORMAL HIGH (ref 70–99)

## 2023-07-23 LAB — SURGICAL PATHOLOGY

## 2023-07-23 MED ORDER — ROSUVASTATIN CALCIUM 20 MG PO TABS
20.0000 mg | ORAL_TABLET | Freq: Every day | ORAL | 4 refills | Status: AC
  Filled 2023-07-23: qty 90, 90d supply, fill #0

## 2023-07-23 MED ORDER — HUMULIN 70/30 KWIKPEN (70-30) 100 UNIT/ML ~~LOC~~ SUPN
10.0000 [IU] | PEN_INJECTOR | Freq: Two times a day (BID) | SUBCUTANEOUS | 11 refills | Status: DC
  Filled 2023-07-23: qty 15, 75d supply, fill #0

## 2023-07-23 MED ORDER — ATENOLOL 50 MG PO TABS
50.0000 mg | ORAL_TABLET | Freq: Two times a day (BID) | ORAL | 4 refills | Status: DC
  Filled 2023-07-23: qty 60, 30d supply, fill #0

## 2023-07-23 MED ORDER — AMOXICILLIN-POT CLAVULANATE 875-125 MG PO TABS
1.0000 | ORAL_TABLET | Freq: Two times a day (BID) | ORAL | 0 refills | Status: DC
  Filled 2023-07-23: qty 10, 5d supply, fill #0

## 2023-07-23 MED ORDER — POTASSIUM CHLORIDE CRYS ER 20 MEQ PO TBCR
40.0000 meq | EXTENDED_RELEASE_TABLET | ORAL | Status: AC
  Administered 2023-07-23: 40 meq via ORAL
  Filled 2023-07-23: qty 2

## 2023-07-23 MED ORDER — AMOXICILLIN-POT CLAVULANATE 875-125 MG PO TABS
1.0000 | ORAL_TABLET | Freq: Two times a day (BID) | ORAL | Status: DC
  Administered 2023-07-23: 1 via ORAL
  Filled 2023-07-23: qty 1

## 2023-07-23 NOTE — Discharge Summary (Signed)
 Physician Discharge Summary  Jennifer Ingram ZOX:[id number] DOB: [birth year] DOA: 07/19/2023  PCP: Rudene Christians, DO  Admit date: 07/19/2023 Discharge date: 07/23/2023  Admitted From: HOME Disposition:HOME Recommendations for Outpatient Follow-up:  Follow up with PCP in 1-2 weeks Please obtain BMP/CBC in one week  Home Health:NONE Equipment/Devices:NONE  Discharge Condition:STABLE CODE STATUS:full Diet recommendation:cardiac Brief/Interim Summary: 73 year old female with history of type 2 diabetes depression CKD stage IIIb hypertension hyperlipidemia admitted with DKA with initial blood glucose of 1121 as she ran out of insulin. She is admitted for the same diagnosis. She was started on insulin drip. Initial anion gap was 23.   Discharge Diagnoses:  Principal Problem:   DKA (diabetic ketoacidosis) (HCC) Active Problems:   CKD stage 3b, GFR 30-44 ml/min (HCC)   Hypertension   Hyperlipidemia   Depression   Hypothyroid   Acute encephalopathy   Hyperglycemia  #1 DKA patient followed by teaching service at [facility].  She ran out of insulin and she came in with hyperglycemia was found to be in DKA.  Main complaints were nausea and vomiting.  Hemoglobin A1c last 1 is 7.1 from June 2024.  CBG (last 3)  Recent Labs (last 2 labs)       Recent Labs    07/21/23 2057 07/22/23 0720 07/22/23 1215  GLUCAP 146* 200* 311*    Discharged on 70/30    #2 right labial growth status post punch biopsy.  Appreciate GYN.  CT abdomen and pelvis shows no acute findings other than fatty liver.  She was treated with IV biotics during the hospital stay and discharged on Augmentin.  She will follow-up with GYN after biopsy results are back.  Her white count came down to 11.8 from 17.2 at the time of discharge.   #3 acute metabolic encephalopathy resolved.   #4.  History of essential hypertension on atenolol at home continue.   #5.  History of hyperlipidemia on Crestor   #6.  Depression on Paxil   #7.   Hypothyroidism on Synthroid last TSH 3.7 on September 2024     Estimated body mass index is 27.25 kg/m as calculated from the following:   Height as of this encounter: 5' (1.524 m).   Weight as of this encounter: 63.3 kg.  Discharge Instructions  Discharge Instructions     Diet - low sodium heart healthy   Complete by: As directed    Increase activity slowly   Complete by: As directed    No wound care   Complete by: As directed       Allergies as of 07/23/2023   No Known Allergies      Medication List     STOP taking these medications    gabapentin 300 MG capsule Commonly known as: NEURONTIN       TAKE these medications    Accu-Chek Guide test strip Generic drug: glucose blood Check blood sugar 3 times per day   Accu-Chek Softclix Lancets lancets Check blood sugar 3 times a day   amoxicillin-clavulanate 875-125 MG tablet Commonly known as: AUGMENTIN Take 1 tablet by mouth 2 (two) times daily.   atenolol 50 MG tablet Commonly known as: TENORMIN Take 1 tablet (50 mg total) by mouth 2 (two) times daily.   Euthyrox 50 MCG tablet Generic drug: levothyroxine Take 1 tablet (50 mcg total) by mouth daily.   FreeStyle Libre 3 Reader Devi USE AS DIRECTED   FreeStyle Libre 3 Sensor Misc PLACE SENSOR ON THE SKIN EVERY 14 DAYS. USE TO CHECK  GLUCOSE CONTI NUOUSLY   HumuLIN 70/30 KwikPen (70-30) 100 UNIT/ML KwikPen Generic drug: insulin isophane & regular human KwikPen Inject 10 Units into the skin 2 (two) times daily. What changed:  how much to take how to take this when to take this additional instructions   PARoxetine 40 MG tablet Commonly known as: PAXIL TAKE 1 TABLET BY MOUTH ONCE DAILY AFTER BREAKFAST   Pen Needles 31G X 5 MM Misc 1 each by Does not apply route daily.   rosuvastatin 20 MG tablet Commonly known as: CRESTOR Take 1 tablet (20 mg total) by mouth daily.        Follow-up Information     Carver Fila, MD Follow up.    Specialty: Gynecologic Oncology Why: they will call you with an appointment Contact information: [address] [place] [address] [phone]         Masters, Florentina Addison, DO Follow up.   Specialty: Internal Medicine Contact information: [address] [place] [address] [phone]                No Known Allergies  Consultations:gyn   Procedures/Studies: CT ABDOMEN PELVIS W CONTRAST Addendum Date: 07/23/2023 ADDENDUM REPORT: 07/23/2023 11:40 ADDENDUM: Original report read by Dr. Fredirick Lathe. Addendum by Dr. Purcell Mouton. Addendum requested regarding the patient's vulvar lesion which was biopsied on 07/20/2023. The CT images obtained 07/21/2023 extend inferior to the symphysis pubis and do not demonstrate any masses within the mons pubis or visualized perineum. There is incomplete visualization of the vulva. No enlarged inguinal lymph nodes are identified. If further imaging is desired clinically, repeat pelvic CT with more inferior imaging or pelvic MRI could be performed. Electronically Signed   By: Carey Bullocks M.D.   On: 07/23/2023 11:40   Result Date: 07/23/2023 CLINICAL DATA:  Vulvar pain, vulvar lesion/mass. Leukocytosis. Evaluate for metastatic disease. * Tracking Code: BO * EXAM: CT ABDOMEN AND PELVIS WITH CONTRAST TECHNIQUE: Multidetector CT imaging of the abdomen and pelvis was performed using the standard protocol following bolus administration of intravenous contrast. RADIATION DOSE REDUCTION: This exam was performed according to the departmental dose-optimization program which includes automated exposure control, adjustment of the mA and/or kV according to patient size and/or use of iterative reconstruction technique. CONTRAST:  OMNIPAQUE IOHEXOL 300 MG/ML  SOLN COMPARISON:  12/28/2022. FINDINGS: Lower chest: Minimal dependent atelectasis. Atherosclerotic calcification of the aorta, aortic valve and coronary arteries. Heart is enlarged. No pericardial or  pleural effusion. Hepatobiliary: Liver is decreased in attenuation diffusely but otherwise unremarkable. Cholecystectomy. No unexpected biliary ductal dilatation. Pancreas: Negative. Spleen: Negative. Adrenals/Urinary Tract: Adrenal glands and kidneys are unremarkable. Ureters are decompressed. Bladder is low in volume. Stomach/Bowel: Small hiatal hernia. Stomach, small bowel and colon are otherwise unremarkable. Appendix is not well-visualized. Vascular/Lymphatic: Atherosclerotic calcification of the aorta. No pathologically enlarged lymph nodes. Reproductive: Uterus is visualized.  No adnexal mass. Other: No free fluid. Tiny umbilical hernia contains fat. Moderate supraumbilical midline ventral hernia contains fat. Mesenteries and peritoneum are unremarkable. Musculoskeletal: Degenerative changes in the spine. IMPRESSION: 1. No evidence of metastatic disease. 2. Hepatic steatosis. 3. Aortic atherosclerosis (ICD10-I70.0). Coronary artery calcification. Electronically Signed: By: Leanna Battles M.D. On: 07/21/2023 13:05   (Echo, Carotid, EGD, Colonoscopy, ERCP)    Subjective:  Anxious to go home Discharge Exam: Vitals:   07/23/23 0518 07/23/23 1404  BP: (!) 122/50 (!) 113/57  Pulse: (!) 56 64  Resp: 18 16  Temp: 97.7 F (36.5 C) 98.1 F (36.7 C)  SpO2:  98% 98%   Vitals:   07/22/23 2032 07/22/23 2101 07/23/23 0518 07/23/23 1404  BP:  120/64 (!) 122/50 (!) 113/57  Pulse: 70 (!) 59 (!) 56 64  Resp:  18 18 16   Temp:  (!) 97.3 F (36.3 C) 97.7 F (36.5 C) 98.1 F (36.7 C)  TempSrc:  Oral Oral   SpO2:  99% 98% 98%  Weight:      Height:        General: Pt is alert, awake, not in acute distress Cardiovascular: RRR, S1/S2 +, no rubs, no gallops Respiratory: CTA bilaterally, no wheezing, no rhonchi Abdominal: Soft, NT, ND, bowel sounds + Extremities: no edema, no cyanosis    The results of significant diagnostics from this hospitalization (including imaging, microbiology, ancillary  and laboratory) are listed below for reference.     Microbiology: Recent Results (from the past 240 hours)  MRSA Next Gen by PCR, Nasal     Status: None   Collection Time: 07/20/23 12:12 AM   Specimen: Nasal Mucosa; Nasal Swab  Result Value Ref Range Status   MRSA by PCR Next Gen NOT DETECTED NOT DETECTED Final    Comment: (NOTE) The GeneXpert MRSA Assay (FDA approved for NASAL specimens only), is one component of a comprehensive MRSA colonization surveillance program. It is not intended to diagnose MRSA infection nor to guide or monitor treatment for MRSA infections. Test performance is not FDA approved in patients less than 63 years old. Performed at [facility], [address], [place], [place] [address]      Labs: BNP (last 3 results) No results for input(s): "BNP" in the last 8760 hours. Basic Metabolic Panel: Recent Labs  Lab 07/20/23 0300 07/20/23 0622 07/20/23 1053 07/21/23 0259 07/23/23 0848  NA 139 140 141 141 141  K 3.4* 3.5 3.6 3.3* 3.1*  CL 104 102 102 106 103  CO2 21* 27 30 25 28   GLUCOSE 389* 183* 145* 96 209*  BUN 28* 29* 28* 27* 23  CREATININE 1.39* 1.17* 1.14* 1.01* 1.06*  CALCIUM 9.0 9.2 9.1 8.8* 8.1*   Liver Function Tests: Recent Labs  Lab 07/19/23 2027 07/21/23 0259  AST 15 21  ALT 10 9  ALKPHOS 118 85  BILITOT 1.4* 0.6  PROT 8.0 6.7  ALBUMIN 4.1 3.3*   No results for input(s): "LIPASE", "AMYLASE" in the last 168 hours. No results for input(s): "AMMONIA" in the last 168 hours. CBC: Recent Labs  Lab 07/19/23 2027 07/19/23 2145 07/21/23 0259 07/23/23 0848  WBC 16.3*  --  17.2* 11.8*  NEUTROABS 14.8*  --   --  7.3  HGB 15.9* 15.6* 15.2* 14.1  HCT 48.4* 46.0 45.4 43.7  MCV 94.2  --  92.1 94.0  PLT 321  --  317 268   Cardiac Enzymes: No results for input(s): "CKTOTAL", "CKMB", "CKMBINDEX", "TROPONINI" in the last 168 hours. BNP: Invalid input(s): "POCBNP" CBG: Recent Labs  Lab 07/22/23 1215 07/22/23 1715  07/22/23 2058 07/23/23 0804 07/23/23 1209  GLUCAP 311* 193* 154* 169* 205*   D-Dimer No results for input(s): "DDIMER" in the last 72 hours. Hgb A1c No results for input(s): "HGBA1C" in the last 72 hours. Lipid Profile Recent Labs    07/21/23 0259  CHOL 172  HDL 33*  LDLCALC 100*  TRIG 196*  CHOLHDL 5.2   Thyroid function studies No results for input(s): "TSH", "T4TOTAL", "T3FREE", "THYROIDAB" in the last 72 hours.  Invalid input(s): "FREET3" Anemia work up No results for input(s): "VITAMINB12", "FOLATE", "  FERRITIN", "TIBC", "IRON", "RETICCTPCT" in the last 72 hours. Urinalysis    Component Value Date/Time   COLORURINE STRAW (A) 07/19/2023 2042   APPEARANCEUR CLEAR 07/19/2023 2042   LABSPEC 1.027 07/19/2023 2042   PHURINE 5.0 07/19/2023 2042   GLUCOSEU >=500 (A) 07/19/2023 2042   HGBUR MODERATE (A) 07/19/2023 2042   BILIRUBINUR NEGATIVE 07/19/2023 2042   KETONESUR 20 (A) 07/19/2023 2042   PROTEINUR NEGATIVE 07/19/2023 2042   UROBILINOGEN 0.2 11/05/2013 1104   NITRITE NEGATIVE 07/19/2023 2042   LEUKOCYTESUR TRACE (A) 07/19/2023 2042   Sepsis Labs Recent Labs  Lab 07/19/23 2027 07/21/23 0259 07/23/23 0848  WBC 16.3* 17.2* 11.8*   Microbiology Recent Results (from the past 240 hours)  MRSA Next Gen by PCR, Nasal     Status: None   Collection Time: 07/20/23 12:12 AM   Specimen: Nasal Mucosa; Nasal Swab  Result Value Ref Range Status   MRSA by PCR Next Gen NOT DETECTED NOT DETECTED Final    Comment: (NOTE) The GeneXpert MRSA Assay (FDA approved for NASAL specimens only), is one component of a comprehensive MRSA colonization surveillance program. It is not intended to diagnose MRSA infection nor to guide or monitor treatment for MRSA infections. Test performance is not FDA approved in patients less than 29 years old. Performed at [facility], [address], [place], [place] [address]      Time coordinating discharge: 36  MIN SIGNED:   Alwyn Ren, MD  [facility] 07/23/2023, 4:10 PM

## 2023-07-23 NOTE — Progress Notes (Addendum)
 GYN Oncology Progress Note  S: Patient reports doing well this am and feels she is gaining some of her strength back. Tolerating diet with no nausea or emesis. No pain reported. Voiding and having bowel movements. No concerns voiced.   O: Alert, oriented, in no acute distress. Currently sitting on side of bed eating breakfast. Lungs clear. Heart regular in rate and rhythm. Abdomen soft, non-tender, with active bowel sounds. No lower extrem edema.  A/P: Vulvar biopsy pending. CBC ordered per Dr. Pricilla Holm to follow up on last WBC count of 17.2 on 07/21/23. Bmet ordered as well with K+ at 3.3 on last draw. CT AP performed on 07/21/2023 with no signs of metastatic disease. Reaching out to radiology since images did not extend far enough to evaluate the vulva to see if CT pelvis is needing to be repeated. Continue with current plan of care per Hospitalist Team.    Update 13:20: Returned to room to assess vulvar lesion. With NT as chaperone, vulva assessed. No significant change from assessment on Friday. No surrounding erythema, no fluctuance. Mild tenderness with palpation. No excessive drainage. Per Dr. Pricilla Holm, since white count decreasing/ vulvar symptoms/exam unchanged, patient can be discharged home and our office will follow up on the biopsy results. No needs voiced per pt. Dr. Jerolyn Center notified.

## 2023-07-23 NOTE — Plan of Care (Signed)
 Discharge instructions given to the patient including medications and follow up.

## 2023-07-23 NOTE — Progress Notes (Signed)
 Pharmacy Antibiotic Note  Jennifer Ingram is a 73 y.o. female admitted on 07/19/2023 with wound on right labia majora open with scant bleeding .  Pharmacy remains consulted for Unasyn dosing for wound infection.  Vulvar biopsy pending  Plan: Continue Unasyn 3gm IV q8h Follow renal function and clinical course  Height: 5' (152.4 cm) Weight: 63.3 kg (139 lb 8.8 oz) IBW/kg (Calculated) : 45.5  Temp (24hrs), Avg:97.5 F (36.4 C), Min:97.3 F (36.3 C), Max:97.7 F (36.5 C)  Recent Labs  Lab 07/19/23 2027 07/19/23 2145 07/20/23 0300 07/20/23 0622 07/20/23 1053 07/21/23 0259 07/23/23 0848  WBC 16.3*  --   --   --   --  17.2* 11.8*  CREATININE 1.52*  1.54*   < > 1.39* 1.17* 1.14* 1.01* 1.06*  LATICACIDVEN  --   --   --   --   --  1.4  --    < > = values in this interval not displayed.    Estimated Creatinine Clearance: 40.4 mL/min (A) (by C-G formula based on SCr of 1.06 mg/dL (H)).    No Known Allergies   Thank you for allowing pharmacy to be a part of this patient's care.  Selinda Eon, PharmD, BCPS Clinical Pharmacist [facility] 07/23/2023 10:17 AM

## 2023-07-23 NOTE — Inpatient Diabetes Management (Signed)
 Inpatient Diabetes Program Recommendations  AACE/ADA: New Consensus Statement on Inpatient Glycemic Control (2015)  Target Ranges:  Prepandial:   less than 140 mg/dL      Peak postprandial:   less than 180 mg/dL (1-2 hours)      Critically ill patients:  140 - 180 mg/dL   Lab Results  Component Value Date   GLUCAP 205 (H) 07/23/2023   HGBA1C >15.5 (H) 07/20/2023    Review of Glycemic Control  Diabetes history: DM1 Outpatient Diabetes medications: 70/30 10 in am and 6 units QPM Current orders for Inpatient glycemic control: 70/30 10 in am and 14 units QPM, Novolog 0-9 TID  HgbA1C - > 15.5% CBGs today: 169, 205 mg/dL Eating 130% PCP - [facility] - Last appt 10/10/22  Inpatient Diabetes Program Recommendations:    Spoke with pt at bedside regarding her diabetes and HgbA1C of > 15.5%. Pt states she has upcoming appt with her PCP soon. Wears CGM and said she takes 70/30 10 in am and 6 units QPM. States she rarely has hypoglycemia. Lives with son and he assists her with cooking, grocery shopping. States her blood sugars have been elevated recently. Instructed pt to call her PCP and let her know blood sugars were consistently > 180 mg/dL. States she doesn't always take the 6 units before dinner. Discussed importance of checking CBGs and maintaining good CBG control to prevent long-term and short-term complications. Explained how hyperglycemia leads to damage within blood vessels which lead to the common complications seen with uncontrolled diabetes. Stressed to the patient the importance of improving glycemic control to prevent further complications from uncontrolled diabetes. Discussed impact of nutrition, exercise, stress, sickness, and medications on diabetes control. Pt states she was ready to go home.   Agree with orders.  Thank you. Ailene Ards, RD, LDN, CDCES Inpatient Diabetes Coordinator [phone]

## 2023-07-23 NOTE — TOC Transition Note (Signed)
 Transition of Care [facility]) - Discharge Note   Patient Details  Name: Jennifer Ingram MRN: [id number] Date of Birth: 1950-08-24  [facility] CM/SW Contact:  Diona Browner, LCSW Phone Number: 07/23/2023, 2:02 PM   Clinical Narrative:    Pt medically ready to d/c home. No TOC needs.    Final next level of care: Home/Self Care Barriers to Discharge: Barriers Resolved   Patient Goals and CMS Choice Patient states their goals for this hospitalization and ongoing recovery are:: return home CMS Medicare.gov Compare Post Acute Care list provided to::  (NA) Choice offered to / list presented to : NA [place] ownership interest in [facility].provided to::  (NA)    Discharge Placement                       Discharge Plan and Services Additional resources added to the After Visit Summary for   In-house Referral: NA              DME Arranged: N/A DME Agency: NA       HH Arranged: NA HH Agency: NA        Social Drivers of Health (SDOH) Interventions SDOH Screenings   Food Insecurity: No Food Insecurity (07/20/2023)  Housing: Low Risk  (07/20/2023)  Transportation Needs: No Transportation Needs (07/20/2023)  Utilities: Not At Risk (07/20/2023)  Alcohol Screen: Low Risk  (10/10/2022)  Depression (PHQ2-9): Low Risk  (10/10/2022)  Physical Activity: Sufficiently Active (10/10/2022)  Social Connections: Moderately Integrated (07/20/2023)  Stress: No Stress Concern Present (10/10/2022)  Tobacco Use: Low Risk  (07/19/2023)     Readmission Risk Interventions    07/21/2023    9:11 AM 01/06/2023    1:24 PM  Readmission Risk Prevention Plan  Transportation Screening Complete Complete  PCP or Specialist Appt within 5-7 Days Complete Complete  Home Care Screening Complete Complete  Medication Review (RN CM) Complete Complete

## 2023-07-24 ENCOUNTER — Telehealth: Payer: Self-pay

## 2023-07-24 ENCOUNTER — Other Ambulatory Visit: Payer: Self-pay | Admitting: Internal Medicine

## 2023-07-24 DIAGNOSIS — E039 Hypothyroidism, unspecified: Secondary | ICD-10-CM

## 2023-07-24 NOTE — Transitions of Care (Post Inpatient/ED Visit) (Signed)
   07/24/2023  Name: Jennifer Ingram MRN: [id number] DOB: 12-24-1950  Today's TOC FU Call Status: Today's TOC FU Call Status:: Unsuccessful Call (2nd Attempt) Unsuccessful Call (2nd Attempt) Date: 07/24/23  Attempted to reach the patient regarding the most recent Inpatient/ED visit.  Follow Up Plan: Additional outreach attempts will be made to reach the patient to complete the Transitions of Care (Post Inpatient/ED visit) call.   Hilbert Odor RN, CCM [place]  VBCI-Population Health RN Care Manager [phone]

## 2023-07-24 NOTE — Telephone Encounter (Signed)
 Medication sent to pharmacy

## 2023-07-24 NOTE — Transitions of Care (Post Inpatient/ED Visit) (Signed)
   07/24/2023  Name: Jennifer Ingram MRN: [id number] DOB: [birth year]  Today's TOC FU Call Status: Today's TOC FU Call Status:: Unsuccessful Call (1st Attempt) Unsuccessful Call (1st Attempt) Date: 07/24/23  Attempted to reach the patient regarding the most recent Inpatient/ED visit.  Follow Up Plan: Additional outreach attempts will be made to reach the patient to complete the Transitions of Care (Post Inpatient/ED visit) call.   Hilbert Odor RN, CCM [place]  VBCI-Population Health RN Care Manager [phone]

## 2023-07-24 NOTE — Progress Notes (Signed)
 Will cool things off and then take her to the OR for additional biopsies, possible excision. May just all be related to chronic wound in the setting of her DM2

## 2023-07-25 ENCOUNTER — Telehealth: Payer: Self-pay

## 2023-07-25 NOTE — Transitions of Care (Post Inpatient/ED Visit) (Signed)
   07/25/2023  Name: Jennifer Ingram MRN: [id number] DOB: 1950-12-29  Today's TOC FU Call Status: Today's TOC FU Call Status:: Unsuccessful Call (3rd Attempt) Unsuccessful Call (3rd Attempt) Date: 07/25/23  Attempted to reach the patient regarding the most recent Inpatient/ED visit.  Follow Up Plan: No further outreach attempts will be made at this time. We have been unable to contact the patient.  Hilbert Odor RN, CCM [place]  VBCI-Population Health RN Care Manager [phone]

## 2023-07-30 ENCOUNTER — Other Ambulatory Visit: Payer: Self-pay | Admitting: Internal Medicine

## 2023-07-30 DIAGNOSIS — E1142 Type 2 diabetes mellitus with diabetic polyneuropathy: Secondary | ICD-10-CM

## 2023-07-30 NOTE — Telephone Encounter (Signed)
 Medication sent to pharmacy

## 2023-08-02 ENCOUNTER — Encounter: Admitting: Internal Medicine

## 2023-08-07 ENCOUNTER — Telehealth: Payer: Self-pay

## 2023-08-07 NOTE — Telephone Encounter (Signed)
Thanks Kim.

## 2023-08-07 NOTE — Telephone Encounter (Addendum)
 At Dr.Tucker's request After several attempts,over the past several days, of trying to reach [name], I have been unable to reach her by phone d/t voicemail is full.  I reached out to the emergency contact, [name]. He states he has a hard time of getting in touch with her as well. I explained the importance of getting her to call our office for an appointment with Dr.Tucker. He states he lives an hour away but will make a trip to [facility] and get her to call our office for an appointment.  Office number given to [facility] for patient to call.   Update sent to Dr.Tucker

## 2023-08-10 ENCOUNTER — Encounter: Payer: Self-pay | Admitting: Gynecologic Oncology

## 2023-08-10 ENCOUNTER — Telehealth: Payer: Self-pay | Admitting: *Deleted

## 2023-08-10 NOTE — Telephone Encounter (Signed)
 Mailed letter to patient

## 2023-08-10 NOTE — Telephone Encounter (Signed)
 Unable to reach Jennifer Ingram by phone, Mailbox is full. I also tried her brother, Velda Shell, his mailbox also full. Office sent patient a letter today for her to call office to schedule an appointment.

## 2023-08-14 ENCOUNTER — Telehealth: Payer: Self-pay

## 2023-08-14 NOTE — Telephone Encounter (Signed)
 LVM for Jennifer Ingram to call office to schedule an appointment with Dr.Tucker

## 2023-08-27 ENCOUNTER — Telehealth: Payer: Self-pay

## 2023-08-27 NOTE — Telephone Encounter (Signed)
Great - thanks [employer]

## 2023-08-27 NOTE — Telephone Encounter (Signed)
 Jennifer Ingram called back stating she can do 5/16 @ 8:00 with Dr. Orvil Bland.

## 2023-08-27 NOTE — Telephone Encounter (Signed)
 Ms.Turbyfill called office stating she received a letter regarding scheduling an appointment with Dr.Tucker. Office offered first available on 5/16 @ 8:00 and pt states that is too early for her because she has to find transportation. She is asking for something in the afternoon.   Pt aware message will be sent to Dr. Orvil Bland for recommendations on where to schedule. Pt voiced an understanding.

## 2023-09-04 ENCOUNTER — Other Ambulatory Visit (HOSPITAL_COMMUNITY): Payer: Self-pay

## 2023-09-04 ENCOUNTER — Other Ambulatory Visit: Payer: Self-pay | Admitting: Internal Medicine

## 2023-09-04 DIAGNOSIS — E119 Type 2 diabetes mellitus without complications: Secondary | ICD-10-CM

## 2023-09-04 NOTE — Telephone Encounter (Signed)
Medication discontinued 10/13/22

## 2023-09-14 ENCOUNTER — Encounter: Payer: Self-pay | Admitting: Gynecologic Oncology

## 2023-09-14 ENCOUNTER — Inpatient Hospital Stay: Attending: Gynecologic Oncology | Admitting: Gynecologic Oncology

## 2023-09-14 VITALS — BP 111/69 | HR 73 | Temp 97.9°F | Resp 17 | Wt 136.4 lb

## 2023-09-14 DIAGNOSIS — E118 Type 2 diabetes mellitus with unspecified complications: Secondary | ICD-10-CM | POA: Insufficient documentation

## 2023-09-14 DIAGNOSIS — N9089 Other specified noninflammatory disorders of vulva and perineum: Secondary | ICD-10-CM | POA: Diagnosis not present

## 2023-09-14 DIAGNOSIS — Z794 Long term (current) use of insulin: Secondary | ICD-10-CM | POA: Insufficient documentation

## 2023-09-14 NOTE — Patient Instructions (Signed)
 It was good to see you today.  Your vulva looks so much better than when you were in the hospital.  Please keep working on making sure your sugars are well-controlled.  We will plan to see you in 4 months to ensure you continue to do well.  Please call if you have any bleeding, discharge, vulvar pain, or other concerning symptoms.

## 2023-09-14 NOTE — Progress Notes (Signed)
 Gynecologic Oncology Return Clinic Visit  09/14/23  Reason for Visit: follow-up  Treatment History: The patient was admitted in March 2025 in DKA after running out of insulin  in the setting of her type 2 diabetes.  She was noted to have what appeared to be a growth on her right labia.  CT imaging on 3/22 showed no masses of the visualized perineum or mons pubis, no metastatic disease including no adenopathy.  Right vulvar lesion was biopsied on 3/21 with pathology showing soft tissue with dense acute and chronic inflammation.  Given concern for possible infection (WBC initially 16.3), she was treated with IV antibiotics and ultimately transition to home to finish a course of Augmentin .  Interval History: Doing very well.  Notes some fatigue.  Has maintained her CBGs under 200.  Denies any vulvar pain, pruritus, discharge, or bleeding.  Endorses normal bowel and bladder function.  Past Medical/Surgical History: Past Medical History:  Diagnosis Date   Arthritis    bilateral knees and back;   Depression    on meds, working well   Diabetes mellitus without complication (HCC) 05/31/2012   on meds   Heart palpitations    rapid   Hyperlipidemia    on meds   Hypertension    on meds    Past Surgical History:  Procedure Laterality Date   APPENDECTOMY  1968   CESAREAN SECTION  1980   CHOLECYSTECTOMY  1985   TONSILLECTOMY  1960    Family History  Problem Relation Age of Onset   Cancer Mother        leukemia   Cancer Father        lung   Breast cancer Neg Hx    Colon polyps Neg Hx    Crohn's disease Neg Hx    Esophageal cancer Neg Hx    Stomach cancer Neg Hx    Rectal cancer Neg Hx     Social History   Socioeconomic History   Marital status: Widowed    Spouse name: Not on file   Number of children: 1   Years of education: 74   Highest education level: 12th grade  Occupational History   Not on file  Tobacco Use   Smoking status: Never   Smokeless tobacco: Never   Vaping Use   Vaping status: Never Used  Substance and Sexual Activity   Alcohol use: No   Drug use: No   Sexual activity: Yes  Other Topics Concern   Not on file  Social History Narrative   Not on file   Social Drivers of Health   Financial Resource Strain: Not on file  Food Insecurity: No Food Insecurity (07/20/2023)   Hunger Vital Sign    Worried About Running Out of Food in the Last Year: Never true    Ran Out of Food in the Last Year: Never true  Transportation Needs: No Transportation Needs (07/20/2023)   PRAPARE - Administrator, Civil Service (Medical): No    Lack of Transportation (Non-Medical): No  Physical Activity: Sufficiently Active (10/10/2022)   Exercise Vital Sign    Days of Exercise per Week: 3 days    Minutes of Exercise per Session: 60 min  Stress: No Stress Concern Present (10/10/2022)   [employer] of Occupational Health - Occupational Stress Questionnaire    Feeling of Stress : Not at all  Social Connections: Moderately Integrated (07/20/2023)   Social Connection and Isolation Panel [NHANES]    Frequency of Communication with Friends and  Family: More than three times a week    Frequency of Social Gatherings with Friends and Family: Once a week    Attends Religious Services: 1 to 4 times per year    Active Member of [employer] or Organizations: No    Attends Banker Meetings: 1 to 4 times per year    Marital Status: Widowed    Current Medications:  Current Outpatient Medications:    Accu-Chek Softclix Lancets lancets, Check blood sugar 3 times a day, Disp: 100 each, Rfl: 11   amoxicillin -clavulanate (AUGMENTIN ) 875-125 MG tablet, Take 1 tablet by mouth 2 (two) times daily., Disp: 10 tablet, Rfl: 0   atenolol  (TENORMIN ) 50 MG tablet, Take 1 tablet (50 mg total) by mouth 2 (two) times daily., Disp: 60 tablet, Rfl: 4   Continuous Blood Gluc Receiver (FREESTYLE LIBRE 3 READER) DEVI, USE AS DIRECTED, Disp: 1 each, Rfl: 0   Continuous  Glucose Sensor (FREESTYLE LIBRE 3 SENSOR) MISC, PLACE SENSOR ON THE SKIN EVERY 14 DAYS TO CHECK GLUXOSE CONTINUOUSLY, Disp: 6 each, Rfl: 0   EUTHYROX  50 MCG tablet, Take 1 tablet by mouth once daily, Disp: 90 tablet, Rfl: 0   glucose blood (ACCU-CHEK GUIDE) test strip, Check blood sugar 3 times per day, Disp: 100 each, Rfl: 12   insulin  isophane & regular human KwikPen (HUMULIN  70/30 KWIKPEN) (70-30) 100 UNIT/ML KwikPen, Inject 10 Units into the skin 2 (two) times daily., Disp: 15 mL, Rfl: 11   Insulin  Pen Needle (PEN NEEDLES) 31G X 5 MM MISC, 1 each by Does not apply route daily., Disp: 90 each, Rfl: 3   PARoxetine  (PAXIL ) 40 MG tablet, TAKE 1 TABLET BY MOUTH ONCE DAILY AFTER BREAKFAST, Disp: 90 tablet, Rfl: 2   rosuvastatin  (CRESTOR ) 20 MG tablet, Take 1 tablet (20 mg total) by mouth daily., Disp: 90 tablet, Rfl: 4  Review of Systems: Denies appetite changes, fevers, chills, unexplained weight changes. Denies hearing loss, neck lumps or masses, mouth sores, ringing in ears or voice changes. Denies cough or wheezing.  Denies shortness of breath. Denies chest pain or palpitations. Denies leg swelling. Denies abdominal distention, pain, blood in stools, constipation, diarrhea, nausea, vomiting, or early satiety. Denies pain with intercourse, dysuria, frequency, hematuria or incontinence. Denies hot flashes, pelvic pain, vaginal bleeding or vaginal discharge.   Denies joint pain, back pain or muscle pain/cramps. Denies itching, rash, or wounds. Denies dizziness, headaches, numbness or seizures. Denies swollen lymph nodes or glands, denies easy bruising or bleeding. Denies anxiety, depression, confusion, or decreased concentration.  Physical Exam: BP 111/69 (BP Location: Right Arm, Patient Position: Sitting)   Pulse 73   Temp 97.9 F (36.6 C) (Oral)   Resp 17   Wt 136 lb 6.4 oz (61.9 kg)   SpO2 97%   BMI 26.64 kg/m  General: Alert, oriented, no acute distress. HEENT: Posterior  oropharynx clear, sclera anicteric. Chest: Clear to auscultation bilaterally.  No wheezes or rhonchi. Cardiovascular: Regular rate and rhythm, no murmurs. Abdomen: soft, nontender.  Normoactive bowel sounds.  No masses or hepatosplenomegaly appreciated.   Extremities: Grossly normal range of motion.  Warm, well perfused.  No edema bilaterally. GU: Hyperpigmented area along most of the length of her right vulva, suspected scar from recently treated infectious process.  No areas of fluctuance or induration.    Laboratory & Radiologic Studies: 07/20/23: A. VULVAR LESION, RIGHT, EXCISION:  - Soft tissue with dense acute and chronic inflammation.  See comment.   COMMENT:   The clinical concern  for malignancy is noted.  The biopsy reveals soft  tissue with dense acute and chronic inflammation.  No squamous  epithelium is identified.  This case was reviewed with Dr. Talmadge Fail who agrees  with the above interpretation.   Assessment & Plan: Jennifer Ingram is a 73 y.o. woman with recent admission for DKA found to have a large right vulvar infection and open wound concerning for possible underlying malignancy.  Biopsy at that time showed acute and chronic inflammation.  She was treated with course of antibiotics.  Today, patient is overall doing very well.  Her exam is markedly improved from recent hospitalization.  She has some hyperpigmented tissue on the right vulva most consistent with scarring.  We will plan to see her back in 4 months to ensure stability of her exam.  At that time, if doing well, she will be discharged to her other providers.  20 minutes of total time was spent for this patient encounter, including preparation, face-to-face counseling with the patient and coordination of care, and documentation of the encounter.  Wiley Hanger, MD  Division of Gynecologic Oncology  Department of Obstetrics and Gynecology  [facility]

## 2023-10-17 ENCOUNTER — Other Ambulatory Visit: Payer: Self-pay | Admitting: Internal Medicine

## 2023-10-17 DIAGNOSIS — E039 Hypothyroidism, unspecified: Secondary | ICD-10-CM

## 2023-10-17 NOTE — Telephone Encounter (Signed)
 Patient last seen 10/10/22, I called the patient to schedule a follow up appointment. Unable to reach the patient, I lvm for her to give us  a call back.

## 2023-11-11 ENCOUNTER — Encounter (HOSPITAL_COMMUNITY): Payer: Self-pay | Admitting: Emergency Medicine

## 2023-11-11 ENCOUNTER — Emergency Department (HOSPITAL_COMMUNITY)

## 2023-11-11 ENCOUNTER — Inpatient Hospital Stay (HOSPITAL_COMMUNITY)
Admission: EM | Admit: 2023-11-11 | Discharge: 2023-11-16 | DRG: 315 | Disposition: A | Discharge status: Skilled Nursing Facility | Attending: Family Medicine | Admitting: Family Medicine

## 2023-11-11 ENCOUNTER — Other Ambulatory Visit: Payer: Self-pay

## 2023-11-11 DIAGNOSIS — K449 Diaphragmatic hernia without obstruction or gangrene: Secondary | ICD-10-CM | POA: Diagnosis present

## 2023-11-11 DIAGNOSIS — I6529 Occlusion and stenosis of unspecified carotid artery: Secondary | ICD-10-CM | POA: Diagnosis not present

## 2023-11-11 DIAGNOSIS — E860 Dehydration: Secondary | ICD-10-CM | POA: Diagnosis not present

## 2023-11-11 DIAGNOSIS — S0990XA Unspecified injury of head, initial encounter: Secondary | ICD-10-CM | POA: Diagnosis not present

## 2023-11-11 DIAGNOSIS — I700 Atherosclerosis of aorta: Secondary | ICD-10-CM | POA: Diagnosis present

## 2023-11-11 DIAGNOSIS — R1311 Dysphagia, oral phase: Secondary | ICD-10-CM | POA: Diagnosis not present

## 2023-11-11 DIAGNOSIS — S42221A 2-part displaced fracture of surgical neck of right humerus, initial encounter for closed fracture: Secondary | ICD-10-CM | POA: Diagnosis not present

## 2023-11-11 DIAGNOSIS — F32A Depression, unspecified: Secondary | ICD-10-CM | POA: Diagnosis present

## 2023-11-11 DIAGNOSIS — I350 Nonrheumatic aortic (valve) stenosis: Secondary | ICD-10-CM | POA: Diagnosis present

## 2023-11-11 DIAGNOSIS — W19XXXA Unspecified fall, initial encounter: Secondary | ICD-10-CM | POA: Diagnosis not present

## 2023-11-11 DIAGNOSIS — E039 Hypothyroidism, unspecified: Secondary | ICD-10-CM | POA: Diagnosis present

## 2023-11-11 DIAGNOSIS — R739 Hyperglycemia, unspecified: Secondary | ICD-10-CM

## 2023-11-11 DIAGNOSIS — M51369 Other intervertebral disc degeneration, lumbar region without mention of lumbar back pain or lower extremity pain: Secondary | ICD-10-CM | POA: Diagnosis not present

## 2023-11-11 DIAGNOSIS — I959 Hypotension, unspecified: Principal | ICD-10-CM | POA: Diagnosis present

## 2023-11-11 DIAGNOSIS — E1122 Type 2 diabetes mellitus with diabetic chronic kidney disease: Secondary | ICD-10-CM | POA: Diagnosis present

## 2023-11-11 DIAGNOSIS — Z7401 Bed confinement status: Secondary | ICD-10-CM | POA: Diagnosis not present

## 2023-11-11 DIAGNOSIS — E785 Hyperlipidemia, unspecified: Secondary | ICD-10-CM | POA: Diagnosis not present

## 2023-11-11 DIAGNOSIS — S199XXA Unspecified injury of neck, initial encounter: Secondary | ICD-10-CM | POA: Diagnosis not present

## 2023-11-11 DIAGNOSIS — E1165 Type 2 diabetes mellitus with hyperglycemia: Secondary | ICD-10-CM | POA: Diagnosis not present

## 2023-11-11 DIAGNOSIS — Y92000 Kitchen of unspecified non-institutional (private) residence as  the place of occurrence of the external cause: Secondary | ICD-10-CM

## 2023-11-11 DIAGNOSIS — F419 Anxiety disorder, unspecified: Secondary | ICD-10-CM | POA: Diagnosis present

## 2023-11-11 DIAGNOSIS — Z01810 Encounter for preprocedural cardiovascular examination: Secondary | ICD-10-CM | POA: Diagnosis not present

## 2023-11-11 DIAGNOSIS — N1832 Chronic kidney disease, stage 3b: Secondary | ICD-10-CM | POA: Diagnosis not present

## 2023-11-11 DIAGNOSIS — I129 Hypertensive chronic kidney disease with stage 1 through stage 4 chronic kidney disease, or unspecified chronic kidney disease: Secondary | ICD-10-CM | POA: Diagnosis present

## 2023-11-11 DIAGNOSIS — Z043 Encounter for examination and observation following other accident: Secondary | ICD-10-CM | POA: Diagnosis not present

## 2023-11-11 DIAGNOSIS — S42211A Unspecified displaced fracture of surgical neck of right humerus, initial encounter for closed fracture: Secondary | ICD-10-CM | POA: Diagnosis not present

## 2023-11-11 DIAGNOSIS — R296 Repeated falls: Secondary | ICD-10-CM | POA: Diagnosis not present

## 2023-11-11 DIAGNOSIS — M25519 Pain in unspecified shoulder: Secondary | ICD-10-CM | POA: Diagnosis not present

## 2023-11-11 DIAGNOSIS — S42291A Other displaced fracture of upper end of right humerus, initial encounter for closed fracture: Secondary | ICD-10-CM | POA: Diagnosis not present

## 2023-11-11 DIAGNOSIS — S42221D 2-part displaced fracture of surgical neck of right humerus, subsequent encounter for fracture with routine healing: Secondary | ICD-10-CM | POA: Diagnosis not present

## 2023-11-11 DIAGNOSIS — N179 Acute kidney failure, unspecified: Secondary | ICD-10-CM | POA: Diagnosis not present

## 2023-11-11 DIAGNOSIS — Z79899 Other long term (current) drug therapy: Secondary | ICD-10-CM

## 2023-11-11 DIAGNOSIS — R413 Other amnesia: Secondary | ICD-10-CM | POA: Diagnosis not present

## 2023-11-11 DIAGNOSIS — M4316 Spondylolisthesis, lumbar region: Secondary | ICD-10-CM | POA: Diagnosis not present

## 2023-11-11 DIAGNOSIS — D72829 Elevated white blood cell count, unspecified: Secondary | ICD-10-CM | POA: Diagnosis present

## 2023-11-11 DIAGNOSIS — M48061 Spinal stenosis, lumbar region without neurogenic claudication: Secondary | ICD-10-CM | POA: Diagnosis not present

## 2023-11-11 DIAGNOSIS — Z794 Long term (current) use of insulin: Secondary | ICD-10-CM

## 2023-11-11 DIAGNOSIS — R2681 Unsteadiness on feet: Secondary | ICD-10-CM | POA: Diagnosis not present

## 2023-11-11 DIAGNOSIS — R55 Syncope and collapse: Secondary | ICD-10-CM | POA: Diagnosis not present

## 2023-11-11 DIAGNOSIS — M6281 Muscle weakness (generalized): Secondary | ICD-10-CM | POA: Diagnosis not present

## 2023-11-11 DIAGNOSIS — I10 Essential (primary) hypertension: Secondary | ICD-10-CM | POA: Diagnosis present

## 2023-11-11 LAB — COMPREHENSIVE METABOLIC PANEL WITH GFR
ALT: 10 U/L (ref 0–44)
AST: 17 U/L (ref 15–41)
Albumin: 3.5 g/dL (ref 3.5–5.0)
Alkaline Phosphatase: 75 U/L (ref 38–126)
Anion gap: 13 (ref 5–15)
BUN: 23 mg/dL (ref 8–23)
CO2: 22 mmol/L (ref 22–32)
Calcium: 8.3 mg/dL — ABNORMAL LOW (ref 8.9–10.3)
Chloride: 98 mmol/L (ref 98–111)
Creatinine, Ser: 1.06 mg/dL — ABNORMAL HIGH (ref 0.44–1.00)
GFR, Estimated: 56 mL/min — ABNORMAL LOW (ref >60)
Glucose, Bld: 540 mg/dL (ref 70–99)
Potassium: 4 mmol/L (ref 3.5–5.1)
Sodium: 133 mmol/L — ABNORMAL LOW (ref 135–145)
Total Bilirubin: 0.8 mg/dL (ref 0.0–1.2)
Total Protein: 6.6 g/dL (ref 6.5–8.1)

## 2023-11-11 LAB — CBC WITH DIFFERENTIAL/PLATELET
Abs Immature Granulocytes: 0.09 K/uL — ABNORMAL HIGH (ref 0.00–0.07)
Basophils Absolute: 0 K/uL (ref 0.0–0.1)
Basophils Relative: 0 %
Eosinophils Absolute: 0.3 K/uL (ref 0.0–0.5)
Eosinophils Relative: 2 %
HCT: 43.9 % (ref 36.0–46.0)
Hemoglobin: 14.8 g/dL (ref 12.0–15.0)
Immature Granulocytes: 1 %
Lymphocytes Relative: 11 %
Lymphs Abs: 1.5 K/uL (ref 0.7–4.0)
MCH: 30 pg (ref 26.0–34.0)
MCHC: 33.7 g/dL (ref 30.0–36.0)
MCV: 89 fL (ref 80.0–100.0)
Monocytes Absolute: 0.7 K/uL (ref 0.1–1.0)
Monocytes Relative: 5 %
Neutro Abs: 11 K/uL — ABNORMAL HIGH (ref 1.7–7.7)
Neutrophils Relative %: 81 %
Platelets: 234 K/uL (ref 150–400)
RBC: 4.93 MIL/uL (ref 3.87–5.11)
RDW: 11.9 % (ref 11.5–15.5)
WBC: 13.7 K/uL — ABNORMAL HIGH (ref 4.0–10.5)
nRBC: 0 % (ref 0.0–0.2)

## 2023-11-11 LAB — URINALYSIS, ROUTINE W REFLEX MICROSCOPIC
Bilirubin Urine: NEGATIVE
Glucose, UA: >=500 mg/dL — AB
Hgb urine dipstick: NEGATIVE
Ketones, ur: 5 mg/dL — AB
Leukocytes,Ua: NEGATIVE
Nitrite: NEGATIVE
Protein, ur: NEGATIVE mg/dL
Specific Gravity, Urine: 1.029 (ref 1.005–1.030)
pH: 5 (ref 5.0–8.0)

## 2023-11-11 LAB — BASIC METABOLIC PANEL WITH GFR
Anion gap: 10 (ref 5–15)
BUN: 23 mg/dL (ref 8–23)
CO2: 23 mmol/L (ref 22–32)
Calcium: 8.6 mg/dL — ABNORMAL LOW (ref 8.9–10.3)
Chloride: 103 mmol/L (ref 98–111)
Creatinine, Ser: 0.98 mg/dL (ref 0.44–1.00)
GFR, Estimated: >60 mL/min (ref >60)
Glucose, Bld: 385 mg/dL — ABNORMAL HIGH (ref 70–99)
Potassium: 3.9 mmol/L (ref 3.5–5.1)
Sodium: 136 mmol/L (ref 135–145)

## 2023-11-11 LAB — TROPONIN I (HIGH SENSITIVITY)
Troponin I (High Sensitivity): <2 ng/L (ref <18)
Troponin I (High Sensitivity): 3 ng/L (ref <18)

## 2023-11-11 LAB — TSH: TSH: 2.076 u[IU]/mL (ref 0.350–4.500)

## 2023-11-11 LAB — CK: Total CK: 48 U/L (ref 38–234)

## 2023-11-11 LAB — PROTIME-INR
INR: 1 (ref 0.8–1.2)
Prothrombin Time: 14.1 s (ref 11.4–15.2)

## 2023-11-11 LAB — CBG MONITORING, ED
Glucose-Capillary: 584 mg/dL (ref 70–99)
Glucose-Capillary: 554 mg/dL (ref 70–99)
Glucose-Capillary: 360 mg/dL — ABNORMAL HIGH (ref 70–99)

## 2023-11-11 LAB — VITAMIN B12: Vitamin B-12: 394 pg/mL (ref 180–914)

## 2023-11-11 LAB — GLUCOSE, CAPILLARY: Glucose-Capillary: 288 mg/dL — ABNORMAL HIGH (ref 70–99)

## 2023-11-11 MED ORDER — ACETAMINOPHEN 650 MG RE SUPP: 650.0000 mg | Freq: Four times a day (QID) | RECTAL | Status: DC | PRN

## 2023-11-11 MED ORDER — ONDANSETRON HCL 4 MG PO TABS: 4.0000 mg | ORAL_TABLET | Freq: Four times a day (QID) | ORAL | Status: DC | PRN

## 2023-11-11 MED ORDER — INSULIN GLARGINE-YFGN 100 UNIT/ML ~~LOC~~ SOLN
5.0000 [IU] | Freq: Every day | SUBCUTANEOUS | Status: DC
  Administered 2023-11-11 – 2023-11-12 (×2): 5 [IU] via SUBCUTANEOUS
  Filled 2023-11-11 (×3): qty 0.05

## 2023-11-11 MED ORDER — SODIUM CHLORIDE 0.9% FLUSH
3.0000 mL | Freq: Two times a day (BID) | INTRAVENOUS | Status: DC
  Administered 2023-11-11 – 2023-11-16 (×9): 3 mL via INTRAVENOUS

## 2023-11-11 MED ORDER — LACTATED RINGERS IV BOLUS
1000.0000 mL | Freq: Once | INTRAVENOUS | Status: AC
  Administered 2023-11-11: 1000 mL via INTRAVENOUS

## 2023-11-11 MED ORDER — ACETAMINOPHEN 325 MG PO TABS
650.0000 mg | ORAL_TABLET | Freq: Four times a day (QID) | ORAL | Status: DC | PRN
  Administered 2023-11-13: 650 mg via ORAL
  Filled 2023-11-11: qty 2

## 2023-11-11 MED ORDER — INSULIN ASPART 100 UNIT/ML IJ SOLN
0.0000 [IU] | Freq: Three times a day (TID) | INTRAMUSCULAR | Status: DC
  Administered 2023-11-11: 15 [IU] via SUBCUTANEOUS
  Administered 2023-11-12: 3 [IU] via SUBCUTANEOUS
  Administered 2023-11-12: 15 [IU] via SUBCUTANEOUS
  Administered 2023-11-12: 5 [IU] via SUBCUTANEOUS
  Administered 2023-11-13: 3 [IU] via SUBCUTANEOUS
  Administered 2023-11-13: 11 [IU] via SUBCUTANEOUS
  Administered 2023-11-14 (×2): 3 [IU] via SUBCUTANEOUS
  Administered 2023-11-14: 5 [IU] via SUBCUTANEOUS
  Administered 2023-11-15 (×2): 3 [IU] via SUBCUTANEOUS
  Administered 2023-11-15 – 2023-11-16 (×2): 2 [IU] via SUBCUTANEOUS
  Filled 2023-11-11: qty 0.15

## 2023-11-11 MED ORDER — MORPHINE SULFATE (PF) 4 MG/ML IV SOLN
4.0000 mg | Freq: Once | INTRAVENOUS | Status: AC
  Administered 2023-11-11: 4 mg via INTRAVENOUS
  Filled 2023-11-11: qty 1

## 2023-11-11 MED ORDER — PAROXETINE HCL 20 MG PO TABS
40.0000 mg | ORAL_TABLET | Freq: Every day | ORAL | Status: DC
  Administered 2023-11-11 – 2023-11-16 (×6): 40 mg via ORAL
  Filled 2023-11-11 (×6): qty 2

## 2023-11-11 MED ORDER — HYDROCODONE-ACETAMINOPHEN 5-325 MG PO TABS
1.0000 | ORAL_TABLET | Freq: Four times a day (QID) | ORAL | Status: DC | PRN
  Administered 2023-11-11 – 2023-11-15 (×2): 2 via ORAL
  Filled 2023-11-11 (×2): qty 2

## 2023-11-11 MED ORDER — LEVOTHYROXINE SODIUM 50 MCG PO TABS
50.0000 ug | ORAL_TABLET | Freq: Every day | ORAL | Status: DC
  Administered 2023-11-12 – 2023-11-16 (×5): 50 ug via ORAL
  Filled 2023-11-11 (×5): qty 1

## 2023-11-11 MED ORDER — ROSUVASTATIN CALCIUM 10 MG PO TABS
20.0000 mg | ORAL_TABLET | Freq: Every day | ORAL | Status: DC
  Administered 2023-11-11 – 2023-11-16 (×6): 20 mg via ORAL
  Filled 2023-11-11 (×6): qty 2

## 2023-11-11 MED ORDER — GABAPENTIN 300 MG PO CAPS
300.0000 mg | ORAL_CAPSULE | Freq: Every day | ORAL | Status: DC
  Administered 2023-11-11 – 2023-11-12 (×2): 300 mg via ORAL
  Filled 2023-11-11 (×2): qty 1

## 2023-11-11 MED ORDER — ATENOLOL 25 MG PO TABS
50.0000 mg | ORAL_TABLET | Freq: Two times a day (BID) | ORAL | Status: DC
  Administered 2023-11-11 – 2023-11-13 (×4): 50 mg via ORAL
  Filled 2023-11-11 (×4): qty 2

## 2023-11-11 MED ORDER — IOHEXOL 300 MG/ML  SOLN
100.0000 mL | Freq: Once | INTRAMUSCULAR | Status: AC | PRN
  Administered 2023-11-11: 80 mL via INTRAVENOUS

## 2023-11-11 MED ORDER — MORPHINE SULFATE (PF) 2 MG/ML IV SOLN
2.0000 mg | INTRAVENOUS | Status: DC | PRN
  Administered 2023-11-13: 2 mg via INTRAVENOUS
  Filled 2023-11-11 (×2): qty 1

## 2023-11-11 MED ORDER — ONDANSETRON HCL 4 MG/2ML IJ SOLN: 4.0000 mg | Freq: Four times a day (QID) | INTRAMUSCULAR | Status: DC | PRN

## 2023-11-11 MED ORDER — ONDANSETRON HCL 4 MG/2ML IJ SOLN
4.0000 mg | Freq: Once | INTRAMUSCULAR | Status: AC
  Administered 2023-11-11: 4 mg via INTRAVENOUS
  Filled 2023-11-11: qty 2

## 2023-11-11 NOTE — Assessment & Plan Note (Signed)
 WBC of 13.7, likely reactive No signs/symptoms of infection  Continue IVF Trend and follow fever curve

## 2023-11-11 NOTE — ED Notes (Signed)
 Hospitalist at bedside

## 2023-11-11 NOTE — H&P (Signed)
 History and Physical    Patient: Jennifer Ingram FMW:[id number] DOB: 30-Sep-1950 DOA: 11/11/2023 DOS: the patient was seen and examined on 11/11/2023 PCP: Patient, No Pcp Per  Patient coming from: Home - lives with her son. Ambulates independently    Chief Complaint: syncope and right shoulder pain   HPI: Jennifer Ingram is a 73 y.o. female with medical history significant of T2DM, HLD, HTN, CKD stage 3b, hypothyroidism, depression who presented to Ed with complaints of syncopal episode this morning. She doesn't remember much.  She doesn't recall falling and just remembers waking up on the floor. She had pain in her right shoulder. Her last memory was of going to bed last night. When asked for any more information she states she thinks she fell in the kitchen around 10AM. Son heard a bang and ran into kitchen and she was on the floor. She was responsive and alert.   Son states she has had a few falls, but has been able to get back up. She is always complaining of being dizzy and has a history of vertigo. He states she has had vertigo for at least a year or more. He states she seems to have dizzy spells when she is getting up to use the bathroom or when she gets up from sleeping.    He has been feeling good. Denies any fever/chills, vision changes/headaches, chest pain or palpitations, shortness of breath or cough, abdominal pain, N/V/D, dysuria or leg swelling.    She does not smoke or drink alcohol.   ER Course:  vitals: afebrile, bp: 135/82, HR: 79, RR: 15, oxygen: 95%RA Pertinent labs: wbc: 13.7, glucose: 540, UA: 5 ketones,  CT head: no acute finding CT cervical spine: no acute findings CT chest/abdomen/pelvis: Near 2-part surgical neck fracture of the right proximal humerus displaced 1.7 cm. 2. No additional acute findings in the chest, abdomen, or pelvis. 3. Moderate-sized hiatal hernia. 4. Degenerative facet arthropathy bilaterally at L4-5 with grade 1 degenerative anterolisthesis of  L4 on L5. Moderate right foraminal stenosis at L4-5 due to disc bulge, facet arthropathy, and disc uncovering. 5.  Aortic Atherosclerosis (ICD10-I70.0). Right elbow xray:  Displaced surgical neck fracture of the right proximal humerus. 2. Very limited ability to move the elbow due to the proximal humeral fracture. No obvious/gross elbow fracture observed. Indeterminate for elbow effusion, adversely affected negative predictive value due to positioning. right humeral fracture: Displaced acute surgical neck fracture of the right proximal humerus. Pelvis xray: no acute findings CXR: no acute cardiopulmonary findings Right shoulder xray: Surgical neck fracture right proximal humerus displaced up to 2.5 cm.   Review of Systems: As mentioned in the history of present illness. All other systems reviewed and are negative. Past Medical History:  Diagnosis Date   Arthritis    bilateral knees and back;   Depression    on meds, working well   Diabetes mellitus without complication (HCC) 05/31/2012   on meds   Heart palpitations    rapid   Hyperlipidemia    on meds   Hypertension    on meds   Past Surgical History:  Procedure Laterality Date   APPENDECTOMY  1968   CESAREAN SECTION  1980   CHOLECYSTECTOMY  1985   TONSILLECTOMY  1960   Social History:  reports that she has never smoked. She has never used smokeless tobacco. She reports that she does not drink alcohol and does not use drugs.  No Known Allergies  Family History  Problem Relation Age  of Onset   Cancer Mother        leukemia   Cancer Father        lung   Breast cancer Neg Hx    Colon polyps Neg Hx    Crohn's disease Neg Hx    Esophageal cancer Neg Hx    Stomach cancer Neg Hx    Rectal cancer Neg Hx     Prior to Admission medications   Medication Sig Start Date End Date Taking? Authorizing Provider  Accu-Chek Softclix Lancets lancets Check blood sugar 3 times a day 01/06/21   Susen Pastor, MD   amoxicillin -clavulanate (AUGMENTIN ) 875-125 MG tablet Take 1 tablet by mouth 2 (two) times daily. 07/23/23   Will Almarie MATSU, MD  atenolol  (TENORMIN ) 50 MG tablet Take 1 tablet (50 mg total) by mouth 2 (two) times daily. 07/23/23   Will Almarie MATSU, MD  Continuous Blood Gluc Receiver (FREESTYLE LIBRE 3 READER) DEVI USE AS DIRECTED 07/13/22   Masters, Izetta, DO  Continuous Glucose Sensor (FREESTYLE LIBRE 3 SENSOR) MISC PLACE SENSOR ON THE SKIN EVERY 14 DAYS TO CHECK GLUXOSE CONTINUOUSLY 07/30/23   Masters, Izetta, DO  EUTHYROX  50 MCG tablet Take 1 tablet by mouth once daily 10/18/23   Masters, Izetta, DO  glucose blood (ACCU-CHEK GUIDE) test strip Check blood sugar 3 times per day 01/06/21   Susen Pastor, MD  insulin  isophane & regular human KwikPen (HUMULIN  70/30 KWIKPEN) (70-30) 100 UNIT/ML KwikPen Inject 10 Units into the skin 2 (two) times daily. 07/23/23   Will Almarie MATSU, MD  Insulin  Pen Needle (PEN NEEDLES) 31G X 5 MM MISC 1 each by Does not apply route daily. 12/05/22   Masters, Katie, DO  PARoxetine  (PAXIL ) 40 MG tablet TAKE 1 TABLET BY MOUTH ONCE DAILY AFTER BREAKFAST 07/23/22   Masters, Izetta, DO  rosuvastatin  (CRESTOR ) 20 MG tablet Take 1 tablet (20 mg total) by mouth daily. 07/23/23   Will Almarie MATSU, MD    Physical Exam: Vitals:   11/11/23 1545 11/11/23 1705 11/11/23 1804 11/11/23 1835  BP: 138/72  (!) 151/78 (!) 146/87  Pulse: 86  88 83  Resp: 18  (!) 23 (!) 21  Temp:   98.1 F (36.7 C)   TempSrc:   Oral   SpO2: 100%  100% 100%  Weight:  61.9 kg    Height:  5' (1.524 m)     General:  Appears calm and comfortable and is in NAD Eyes:  PERRL, EOMI, normal lids, iris ENT:  grossly normal hearing, lips & tongue, mmm; appropriate dentition Neck:  no LAD, masses or thyromegaly; no carotid bruits Cardiovascular:  RRR, +systolic murmur. No LE edema.  Respiratory:   CTA bilaterally with no wheezes/rales/rhonchi.  Normal respiratory effort. Abdomen:  soft, mild TTP  generalized, ND, NABS. No rebound or guarding.  Back:   normal alignment, no CVAT Skin:  no rash or induration seen on limited exam Musculoskeletal:  grossly normal tone LUE/BLE, good ROM,RUE: shoulder in sling with diffuse edema and bruising to proximal humerus  Lower extremity:  No LE edema.  Limited foot exam with no ulcerations.  2+ distal pulses. Psychiatric:  grossly normal mood and affect, speech fluent and appropriate, AOx2 Neurologic:  CN 2-12 grossly intact, moves all extremities in coordinated fashion, sensation intact   Radiological Exams on Admission: Independently reviewed - see discussion in A/P where applicable  CT CHEST ABDOMEN PELVIS W CONTRAST Result Date: 11/11/2023 CLINICAL DATA:  Syncope, right shoulder deformity. Earlier imaging today revealed  a surgical neck fracture the right proximal humerus. EXAM: CT CHEST, ABDOMEN, AND PELVIS WITH CONTRAST TECHNIQUE: Multidetector CT imaging of the chest, abdomen and pelvis was performed following the standard protocol during bolus administration of intravenous contrast. RADIATION DOSE REDUCTION: This exam was performed according to the departmental dose-optimization program which includes automated exposure control, adjustment of the mA and/or kV according to patient size and/or use of iterative reconstruction technique. CONTRAST:  80mL OMNIPAQUE  IOHEXOL  300 MG/ML  SOLN COMPARISON:  Radiographs from 11/11/2023 and CT abdomen from 07/21/2023 FINDINGS: CT CHEST FINDINGS Cardiovascular: Atheromatous vascular calcification the aortic arch. Mild aortic valve calcification. Mediastinum/Nodes: Moderate-sized hiatal hernia. Lungs/Pleura: Biapical pleuroparenchymal scarring. Dependent subsegmental atelectasis in both lower lobes. Mild scarring in the vicinity of the right minor fissure. Musculoskeletal: Mild lower thoracic spondylosis. Neer 2 part surgical neck fracture of the right proximal humerus displaced 1.7 cm CT ABDOMEN PELVIS FINDINGS  Hepatobiliary: Cholecystectomy. Stable mild prominence of the common bile duct at 1.1 cm, likely a physiologic response to cholecystectomy. Pancreas: Unremarkable Spleen: Unremarkable Adrenals/Urinary Tract: Unremarkable Stomach/Bowel: Unremarkable Vascular/Lymphatic: Atherosclerosis is present, including aortoiliac atherosclerotic disease. Reproductive: Unremarkable Other: No supplemental non-categorized findings. Musculoskeletal: Degenerative facet arthropathy bilaterally at L4-5 with grade 1 degenerative anterolisthesis of L4 on L5. Moderate right foraminal stenosis at L4-5 due to disc bulge, facet arthropathy, and disc uncovering. IMPRESSION: 1. Neer 2-part surgical neck fracture of the right proximal humerus displaced 1.7 cm. 2. No additional acute findings in the chest, abdomen, or pelvis. 3. Moderate-sized hiatal hernia. 4. Degenerative facet arthropathy bilaterally at L4-5 with grade 1 degenerative anterolisthesis of L4 on L5. Moderate right foraminal stenosis at L4-5 due to disc bulge, facet arthropathy, and disc uncovering. 5.  Aortic Atherosclerosis (ICD10-I70.0). Electronically Signed   By: Ryan Salvage M.D.   On: 11/11/2023 17:18   CT Head Wo Contrast Result Date: 11/11/2023 EXAM: CT HEAD AND CERVICAL SPINE 11/11/2023 05:01:25 PM TECHNIQUE: CT of the head and cervical spine was performed without the administration of intravenous contrast. Multiplanar reformatted images are provided for review. Automated exposure control, iterative reconstruction, and/or weight based adjustment of the mA/kV was utilized to reduce the radiation dose to as low as reasonably achievable. COMPARISON: CT head 01/04/2023 CLINICAL HISTORY: Head trauma, minor (Age >= 65y). Pt bib gcems for syncopal episode this morning. Son called 911. Patietn doesn't remember falling or having syncopal episode. Obvious deformity to right shoulder. FINDINGS: CT HEAD BRAIN AND VENTRICLES: No acute intracranial hemorrhage. No mass effect  or midline shift. No abnormal extra-axial fluid collection. Gray-white differentiation is maintained. No hydrocephalus. ORBITS: No acute abnormality. SINUSES AND MASTOIDS: No acute abnormality. SOFT TISSUES AND SKULL: No acute skull fracture. No acute soft tissue abnormality. CT CERVICAL SPINE BONES AND ALIGNMENT: No acute fracture or traumatic malalignment. 3 mm degenerative anterolisthesis of C4 on C5. DEGENERATIVE CHANGES: Degenerative endplate changes at C5-6. Left greater than right facet arthropathy at C4-5. SOFT TISSUES: No prevertebral soft tissue swelling. VASCULATURE: Atherosclerotic calcifications of the carotid bulbs. IMPRESSION: 1. No acute intracranial abnormality. 2. No acute fracture or traumatic malalignment of the cervical spine. Electronically signed by: Ryan Chess MD 11/11/2023 05:14 PM EDT RP Workstation: [id number]   CT Cervical Spine Wo Contrast Result Date: 11/11/2023 EXAM: CT HEAD AND CERVICAL SPINE 11/11/2023 05:01:25 PM TECHNIQUE: CT of the head and cervical spine was performed without the administration of intravenous contrast. Multiplanar reformatted images are provided for review. Automated exposure control, iterative reconstruction, and/or weight based adjustment of the mA/kV was utilized to reduce the  radiation dose to as low as reasonably achievable. COMPARISON: CT head 01/04/2023 CLINICAL HISTORY: Head trauma, minor (Age >= 65y). Pt bib gcems for syncopal episode this morning. Son called 911. Patietn doesn't remember falling or having syncopal episode. Obvious deformity to right shoulder. FINDINGS: CT HEAD BRAIN AND VENTRICLES: No acute intracranial hemorrhage. No mass effect or midline shift. No abnormal extra-axial fluid collection. Gray-white differentiation is maintained. No hydrocephalus. ORBITS: No acute abnormality. SINUSES AND MASTOIDS: No acute abnormality. SOFT TISSUES AND SKULL: No acute skull fracture. No acute soft tissue abnormality. CT CERVICAL SPINE BONES AND  ALIGNMENT: No acute fracture or traumatic malalignment. 3 mm degenerative anterolisthesis of C4 on C5. DEGENERATIVE CHANGES: Degenerative endplate changes at C5-6. Left greater than right facet arthropathy at C4-5. SOFT TISSUES: No prevertebral soft tissue swelling. VASCULATURE: Atherosclerotic calcifications of the carotid bulbs. IMPRESSION: 1. No acute intracranial abnormality. 2. No acute fracture or traumatic malalignment of the cervical spine. Electronically signed by: Ryan Chess MD 11/11/2023 05:14 PM EDT RP Workstation: [id number]   DG Chest Portable 1 View Result Date: 11/11/2023 CLINICAL DATA:  Right proximal humeral fracture, fall EXAM: PORTABLE CHEST 1 VIEW COMPARISON:  12/28/2022 FINDINGS: The displaced right proximal humeral surgical neck fracture is observed. The lungs appear clear. Cardiac and mediastinal contours normal. No blunting of the costophrenic angles. Mild lower thoracic spondylosis. IMPRESSION: 1. Displaced right proximal humeral surgical neck fracture. 2. No acute cardiopulmonary findings. 3. Mild lower thoracic spondylosis. Electronically Signed   By: Ryan Salvage M.D.   On: 11/11/2023 15:28   DG Pelvis Portable Result Date: 11/11/2023 CLINICAL DATA:  Fall, right proximal humeral fracture EXAM: PORTABLE PELVIS 1-2 VIEWS COMPARISON:  CT pelvis 07/21/2023 FINDINGS: Chronic degenerative disc disease at L4-5. No pelvic fracture or acute bony finding identified. IMPRESSION: 1. No pelvic fracture or acute bony finding identified. 2. Chronic degenerative disc disease at L4-5. Electronically Signed   By: Ryan Salvage M.D.   On: 11/11/2023 15:26   DG Elbow 2 Views Right Result Date: 11/11/2023 CLINICAL DATA:  Deformity, fall EXAM: RIGHT ELBOW - 2 VIEW COMPARISON:  None Available. FINDINGS: Displaced surgical neck fracture the right proximal humerus. The patient has very limited ability to move the elbow due to the proximal humeral fracture. Both views are very limited and  oblique. No obvious/gross elbow fracture observed. Indeterminate for elbow effusion. IMPRESSION: 1. Displaced surgical neck fracture of the right proximal humerus. 2. Very limited ability to move the elbow due to the proximal humeral fracture. No obvious/gross elbow fracture observed. Indeterminate for elbow effusion, adversely affected negative predictive value due to positioning. Electronically Signed   By: Ryan Salvage M.D.   On: 11/11/2023 15:25   DG Humerus Right Result Date: 11/11/2023 CLINICAL DATA:  Fall, shoulder deformity EXAM: RIGHT HUMERUS - 2+ VIEW COMPARISON:  None Available. FINDINGS: Displaced acute surgical neck fracture the right proximal humerus. No other fracture is identified. IMPRESSION: 1. Displaced acute surgical neck fracture of the right proximal humerus. Electronically Signed   By: Ryan Salvage M.D.   On: 11/11/2023 15:24   DG Shoulder Right Portable Result Date: 11/11/2023 CLINICAL DATA:  Fall, shoulder deformity EXAM: RIGHT SHOULDER - 1 VIEW COMPARISON:  None Available. FINDINGS: Surgical neck fracture proximal humerus displaced up to 2.5 cm. No other fracture is observed. IMPRESSION: 1. Surgical neck fracture right proximal humerus displaced up to 2.5 cm. Electronically Signed   By: Ryan Salvage M.D.   On: 11/11/2023 15:23    EKG: Independently reviewed.  NSR with  rate 72; nonspecific ST changes with no evidence of acute ischemia   Labs on Admission: I have personally reviewed the available labs and imaging studies at the time of the admission.  Pertinent labs:   wbc: 13.7,  glucose: 540,  UA: 5 ketones,  Assessment and Plan: Principal Problem:   Syncope and collapse Active Problems:   2-part displaced fracture of surgical neck of right humerus, initial encounter for closed fracture   Uncontrolled type 2 diabetes mellitus with hyperglycemia, with long-term current use of insulin  (HCC)   Leukocytosis   Hypertension   CKD stage 3b, GFR 30-44  ml/min (HCC)   Hyperlipidemia   Depression   Hypothyroid    Assessment and Plan: * Syncope and collapse 73 year old female presenting to ED after syncopal episode at home which she has no recollection of any event leading up to this.  -admit to tele  -syncope work up -head CT with no acute findings  -no acute EKG changes, troponin pending  -check orthostatics  -TED hose -echo -check brain MRI with amnesia, metabolic labs, no signs of infectious source  -check CK  -consider EEG  -hyperglycemic, but A1C of >15.5 so likely runs high. No AG or criteria for DKA, but will follow closely for early DKA -continue IVF  -son states she has history of vertigo and getting dizzy on standing. F/u on orthostatics, may benefit from vestibular PT. Numerous falls, PT eval   2-part displaced fracture of surgical neck of right humerus, initial encounter for closed fracture Ortho consulted and will see her inpatient Pain control with oral pain medication and morphine  Ice as needed  Will need OT depending on ortho surgery plans    Uncontrolled type 2 diabetes mellitus with hyperglycemia, with long-term current use of insulin  (HCC) She has poorly controlled diabetes. No AG, normal bicarb. 5 ketones in urine.  Received 1L IVF in ED, give an additional liter  States she is compliant with her insulin  regimen at home  Son states she has been eating poorly and lots of treats/sugars  Start long acting insulin  at 5 units, moderate SSI QAC/HS  Will repeat bmp to make sure not heading into DKA  A1C of >15.5 in 06/2023  Diabetic educator   Leukocytosis WBC of 13.7, likely reactive No signs/symptoms of infection  Continue IVF Trend and follow fever curve   Hypertension Continue home atenolol    CKD stage 3b, GFR 30-44 ml/min (HCC) Better than baseline (1.1-1.2)  Continue to monitor   Hyperlipidemia Continue crestor  20mg  daily   Hypothyroid TSH from 2024 wnl Repeat TSH with amnesia Continue  home synthroid    Depression Continue paxil  daily     Advance Care Planning:   Code Status: Full Code   Consults: ortho surgery: Dr. Ernie, diabetic educator, PT   DVT Prophylaxis: Scds/TED hose   Family Communication: updated her son by phone, Selinda Lamer   Severity of Illness: The appropriate patient status for this patient is INPATIENT. Inpatient status is judged to be reasonable and necessary in order to provide the required intensity of service to ensure the patient's safety. The patient's presenting symptoms, physical exam findings, and initial radiographic and laboratory data in the context of their chronic comorbidities is felt to place them at high risk for further clinical deterioration. Furthermore, it is not anticipated that the patient will be medically stable for discharge from the hospital within 2 midnights of admission.   * I certify that at the point of admission it is my clinical  judgment that the patient will require inpatient hospital care spanning beyond 2 midnights from the point of admission due to high intensity of service, high risk for further deterioration and high frequency of surveillance required.*  Author: Isaiah Geralds, MD 11/11/2023 7:27 PM  For on call review [email or web address].

## 2023-11-11 NOTE — Assessment & Plan Note (Signed)
 TSH from 2024 wnl Repeat TSH with amnesia Continue home synthroid 

## 2023-11-11 NOTE — ED Notes (Signed)
 Notified nurse of CBG results

## 2023-11-11 NOTE — ED Provider Notes (Signed)
 [place] EMERGENCY DEPARTMENT AT [facility] Provider Note   CSN: [id number] Arrival date & time: 11/11/23  1409     History  Chief Complaint  Patient presents with   Loss of Consciousness    Jennifer Ingram is a 73 y.o. female with PMH as listed below who presents bib gcems for presumed fall/syncopal episode this morning. Son called 911. Patient doesn't remember falling or having syncopal episode. Endorses pain in her right shoulder, right abdomen, right chest. Doesn't remember anything about what happened earlier today, states I guess I fell. She is A&Ox4 now but is amnestic to event. Denies neck pain. States she thinks she was feeling at her baseline earlier today. With EMS received 75 mcg of fentanyl and arm was placed in fabric sling. Denies numbness/tingling.    Past Medical History:  Diagnosis Date   Arthritis    bilateral knees and back;   Depression    on meds, working well   Diabetes mellitus without complication (HCC) 05/31/2012   on meds   Heart palpitations    rapid   Hyperlipidemia    on meds   Hypertension    on meds       Home Medications Prior to Admission medications   Medication Sig Start Date End Date Taking? Authorizing Provider  atenolol  (TENORMIN ) 50 MG tablet Take 1 tablet (50 mg total) by mouth 2 (two) times daily. 07/23/23  Yes Will Almarie MATSU, MD  EUTHYROX  50 MCG tablet Take 1 tablet by mouth once daily 10/18/23  Yes Masters, Katie, DO  gabapentin  (NEURONTIN ) 300 MG capsule Take 300 mg by mouth at bedtime. 07/28/23  Yes [provider]  insulin  isophane & regular human KwikPen (HUMULIN  70/30 KWIKPEN) (70-30) 100 UNIT/ML KwikPen Inject 10 Units into the skin 2 (two) times daily. 07/23/23  Yes Will Almarie MATSU, MD  PARoxetine  (PAXIL ) 40 MG tablet TAKE 1 TABLET BY MOUTH ONCE DAILY AFTER BREAKFAST 07/23/22  Yes Masters, Katie, DO  rosuvastatin  (CRESTOR ) 20 MG tablet Take 1 tablet (20 mg total) by mouth daily. 07/23/23  Yes  Will Almarie MATSU, MD  Accu-Chek Softclix Lancets lancets Check blood sugar 3 times a day 01/06/21   Susen Pastor, MD  amoxicillin -clavulanate (AUGMENTIN ) 875-125 MG tablet Take 1 tablet by mouth 2 (two) times daily. Patient not taking: Reported on 11/11/2023 07/23/23   Will Almarie MATSU, MD  Continuous Blood Gluc Receiver (FREESTYLE LIBRE 3 READER) DEVI USE AS DIRECTED 07/13/22   Masters, Izetta, DO  Continuous Glucose Sensor (FREESTYLE LIBRE 3 SENSOR) MISC PLACE SENSOR ON THE SKIN EVERY 14 DAYS TO CHECK GLUXOSE CONTINUOUSLY 07/30/23   Masters, Katie, DO  glucose blood (ACCU-CHEK GUIDE) test strip Check blood sugar 3 times per day 01/06/21   Susen Pastor, MD  Insulin  Pen Needle (PEN NEEDLES) 31G X 5 MM MISC 1 each by Does not apply route daily. 12/05/22   Masters, Katie, DO      Allergies    Patient has no known allergies.    Review of Systems   Review of Systems A 10 point review of systems was performed and is negative unless otherwise reported in HPI.  Physical Exam Updated Vital Signs BP (!) 146/87   Pulse 83   Temp 98.1 F (36.7 C) (Oral)   Resp (!) 21   Ht 5' (1.524 m)   Wt 61.9 kg   SpO2 100%   BMI 26.65 kg/m  Physical Exam General: Normal appearing elderly female, lying in bed.  HEENT: NCAT,  PERRLA, Sclera anicteric, MMM, trachea midline. No facial trauma. Cardiology: RRR, no murmurs/rubs/gallops.  Resp: Normal respiratory rate and effort. CTAB, no wheezes, rhonchi, crackles.  Abd: Soft, non-tender, non-distended. No rebound tenderness or guarding.  GU: Deferred. MSK: Obvious deformity with severe tenderness to palpation to right proximal humerus with ecchymosis on the anterior shoulder but no open wounds.  Soft compartments.  Distally neurovascularly intact with intact range of motion of the right wrist and hand.  Range of motion of the shoulder and elbow limited due to severe pain.  BL LEs and LUE wnl. Skin: warm, dry. Back: No CVA tenderness. No midline C T or L  spine TTP deformities or stepoffs. Neuro: A&Ox4, CNs II-XII grossly intact. MAEs. Sensation grossly intact.  Psych: Normal mood and affect.   ED Results / Procedures / Treatments   Labs (all labs ordered are listed, but only abnormal results are displayed) Labs Reviewed  CBC WITH DIFFERENTIAL/PLATELET - Abnormal; Notable for the following components:      Result Value   WBC 13.7 (*)    Neutro Abs 11.0 (*)    Abs Immature Granulocytes 0.09 (*)    All other components within normal limits  COMPREHENSIVE METABOLIC PANEL WITH GFR - Abnormal; Notable for the following components:   Sodium 133 (*)    Glucose, Bld 540 (*)    Creatinine, Ser 1.06 (*)    Calcium  8.3 (*)    GFR, Estimated 56 (*)    All other components within normal limits  URINALYSIS, ROUTINE W REFLEX MICROSCOPIC - Abnormal; Notable for the following components:   APPearance HAZY (*)    Glucose, UA >=500 (*)    Ketones, ur 5 (*)    Bacteria, UA MANY (*)    All other components within normal limits  CBG MONITORING, ED - Abnormal; Notable for the following components:   Glucose-Capillary 584 (*)    All other components within normal limits  CBG MONITORING, ED - Abnormal; Notable for the following components:   Glucose-Capillary 554 (*)    All other components within normal limits  CBG MONITORING, ED - Abnormal; Notable for the following components:   Glucose-Capillary 360 (*)    All other components within normal limits  PROTIME-INR  CK  VITAMIN B12  TSH  RAPID URINE DRUG SCREEN, HOSP PERFORMED  BASIC METABOLIC PANEL WITH GFR  BASIC METABOLIC PANEL WITH GFR  CBC  CBG MONITORING, ED  TROPONIN I (HIGH SENSITIVITY)    EKG EKG Interpretation Date/Time:  Sunday November 11 2023 14:27:19 EDT Ventricular Rate:  72 PR Interval:  124 QRS Duration:  98 QT Interval:  441 QTC Calculation: 483 R Axis:   -67  Text Interpretation: Sinus rhythm Inferior infarct, old Probable anterior infarct, old Confirmed by Franklyn Gills  [phone]) on 11/11/2023 3:16:41 PM  Radiology CT CHEST ABDOMEN PELVIS W CONTRAST Result Date: 11/11/2023 CLINICAL DATA:  Syncope, right shoulder deformity. Earlier imaging today revealed a surgical neck fracture the right proximal humerus. EXAM: CT CHEST, ABDOMEN, AND PELVIS WITH CONTRAST TECHNIQUE: Multidetector CT imaging of the chest, abdomen and pelvis was performed following the standard protocol during bolus administration of intravenous contrast. RADIATION DOSE REDUCTION: This exam was performed according to the departmental dose-optimization program which includes automated exposure control, adjustment of the mA and/or kV according to patient size and/or use of iterative reconstruction technique. CONTRAST:  80mL OMNIPAQUE  IOHEXOL  300 MG/ML  SOLN COMPARISON:  Radiographs from 11/11/2023 and CT abdomen from 07/21/2023 FINDINGS: CT CHEST FINDINGS Cardiovascular: Atheromatous vascular  calcification the aortic arch. Mild aortic valve calcification. Mediastinum/Nodes: Moderate-sized hiatal hernia. Lungs/Pleura: Biapical pleuroparenchymal scarring. Dependent subsegmental atelectasis in both lower lobes. Mild scarring in the vicinity of the right minor fissure. Musculoskeletal: Mild lower thoracic spondylosis. Neer 2 part surgical neck fracture of the right proximal humerus displaced 1.7 cm CT ABDOMEN PELVIS FINDINGS Hepatobiliary: Cholecystectomy. Stable mild prominence of the common bile duct at 1.1 cm, likely a physiologic response to cholecystectomy. Pancreas: Unremarkable Spleen: Unremarkable Adrenals/Urinary Tract: Unremarkable Stomach/Bowel: Unremarkable Vascular/Lymphatic: Atherosclerosis is present, including aortoiliac atherosclerotic disease. Reproductive: Unremarkable Other: No supplemental non-categorized findings. Musculoskeletal: Degenerative facet arthropathy bilaterally at L4-5 with grade 1 degenerative anterolisthesis of L4 on L5. Moderate right foraminal stenosis at L4-5 due to disc bulge, facet  arthropathy, and disc uncovering. IMPRESSION: 1. Neer 2-part surgical neck fracture of the right proximal humerus displaced 1.7 cm. 2. No additional acute findings in the chest, abdomen, or pelvis. 3. Moderate-sized hiatal hernia. 4. Degenerative facet arthropathy bilaterally at L4-5 with grade 1 degenerative anterolisthesis of L4 on L5. Moderate right foraminal stenosis at L4-5 due to disc bulge, facet arthropathy, and disc uncovering. 5.  Aortic Atherosclerosis (ICD10-I70.0). Electronically Signed   By: Ryan Salvage M.D.   On: 11/11/2023 17:18   CT Head Wo Contrast Result Date: 11/11/2023 EXAM: CT HEAD AND CERVICAL SPINE 11/11/2023 05:01:25 PM TECHNIQUE: CT of the head and cervical spine was performed without the administration of intravenous contrast. Multiplanar reformatted images are provided for review. Automated exposure control, iterative reconstruction, and/or weight based adjustment of the mA/kV was utilized to reduce the radiation dose to as low as reasonably achievable. COMPARISON: CT head 01/04/2023 CLINICAL HISTORY: Head trauma, minor (Age >= 65y). Pt bib gcems for syncopal episode this morning. Son called 911. Patietn doesn't remember falling or having syncopal episode. Obvious deformity to right shoulder. FINDINGS: CT HEAD BRAIN AND VENTRICLES: No acute intracranial hemorrhage. No mass effect or midline shift. No abnormal extra-axial fluid collection. Gray-white differentiation is maintained. No hydrocephalus. ORBITS: No acute abnormality. SINUSES AND MASTOIDS: No acute abnormality. SOFT TISSUES AND SKULL: No acute skull fracture. No acute soft tissue abnormality. CT CERVICAL SPINE BONES AND ALIGNMENT: No acute fracture or traumatic malalignment. 3 mm degenerative anterolisthesis of C4 on C5. DEGENERATIVE CHANGES: Degenerative endplate changes at C5-6. Left greater than right facet arthropathy at C4-5. SOFT TISSUES: No prevertebral soft tissue swelling. VASCULATURE: Atherosclerotic  calcifications of the carotid bulbs. IMPRESSION: 1. No acute intracranial abnormality. 2. No acute fracture or traumatic malalignment of the cervical spine. Electronically signed by: Ryan Chess MD 11/11/2023 05:14 PM EDT RP Workstation: [id number]   CT Cervical Spine Wo Contrast Result Date: 11/11/2023 EXAM: CT HEAD AND CERVICAL SPINE 11/11/2023 05:01:25 PM TECHNIQUE: CT of the head and cervical spine was performed without the administration of intravenous contrast. Multiplanar reformatted images are provided for review. Automated exposure control, iterative reconstruction, and/or weight based adjustment of the mA/kV was utilized to reduce the radiation dose to as low as reasonably achievable. COMPARISON: CT head 01/04/2023 CLINICAL HISTORY: Head trauma, minor (Age >= 67y). Pt bib gcems for syncopal episode this morning. Son called 911. Patietn doesn't remember falling or having syncopal episode. Obvious deformity to right shoulder. FINDINGS: CT HEAD BRAIN AND VENTRICLES: No acute intracranial hemorrhage. No mass effect or midline shift. No abnormal extra-axial fluid collection. Gray-white differentiation is maintained. No hydrocephalus. ORBITS: No acute abnormality. SINUSES AND MASTOIDS: No acute abnormality. SOFT TISSUES AND SKULL: No acute skull fracture. No acute soft tissue abnormality. CT CERVICAL SPINE BONES  AND ALIGNMENT: No acute fracture or traumatic malalignment. 3 mm degenerative anterolisthesis of C4 on C5. DEGENERATIVE CHANGES: Degenerative endplate changes at C5-6. Left greater than right facet arthropathy at C4-5. SOFT TISSUES: No prevertebral soft tissue swelling. VASCULATURE: Atherosclerotic calcifications of the carotid bulbs. IMPRESSION: 1. No acute intracranial abnormality. 2. No acute fracture or traumatic malalignment of the cervical spine. Electronically signed by: Ryan Chess MD 11/11/2023 05:14 PM EDT RP Workstation: [id number]   DG Chest Portable 1 View Result Date:  11/11/2023 CLINICAL DATA:  Right proximal humeral fracture, fall EXAM: PORTABLE CHEST 1 VIEW COMPARISON:  12/28/2022 FINDINGS: The displaced right proximal humeral surgical neck fracture is observed. The lungs appear clear. Cardiac and mediastinal contours normal. No blunting of the costophrenic angles. Mild lower thoracic spondylosis. IMPRESSION: 1. Displaced right proximal humeral surgical neck fracture. 2. No acute cardiopulmonary findings. 3. Mild lower thoracic spondylosis. Electronically Signed   By: Ryan Salvage M.D.   On: 11/11/2023 15:28   DG Pelvis Portable Result Date: 11/11/2023 CLINICAL DATA:  Fall, right proximal humeral fracture EXAM: PORTABLE PELVIS 1-2 VIEWS COMPARISON:  CT pelvis 07/21/2023 FINDINGS: Chronic degenerative disc disease at L4-5. No pelvic fracture or acute bony finding identified. IMPRESSION: 1. No pelvic fracture or acute bony finding identified. 2. Chronic degenerative disc disease at L4-5. Electronically Signed   By: Ryan Salvage M.D.   On: 11/11/2023 15:26   DG Elbow 2 Views Right Result Date: 11/11/2023 CLINICAL DATA:  Deformity, fall EXAM: RIGHT ELBOW - 2 VIEW COMPARISON:  None Available. FINDINGS: Displaced surgical neck fracture the right proximal humerus. The patient has very limited ability to move the elbow due to the proximal humeral fracture. Both views are very limited and oblique. No obvious/gross elbow fracture observed. Indeterminate for elbow effusion. IMPRESSION: 1. Displaced surgical neck fracture of the right proximal humerus. 2. Very limited ability to move the elbow due to the proximal humeral fracture. No obvious/gross elbow fracture observed. Indeterminate for elbow effusion, adversely affected negative predictive value due to positioning. Electronically Signed   By: Ryan Salvage M.D.   On: 11/11/2023 15:25   DG Humerus Right Result Date: 11/11/2023 CLINICAL DATA:  Fall, shoulder deformity EXAM: RIGHT HUMERUS - 2+ VIEW COMPARISON:   None Available. FINDINGS: Displaced acute surgical neck fracture the right proximal humerus. No other fracture is identified. IMPRESSION: 1. Displaced acute surgical neck fracture of the right proximal humerus. Electronically Signed   By: Ryan Salvage M.D.   On: 11/11/2023 15:24   DG Shoulder Right Portable Result Date: 11/11/2023 CLINICAL DATA:  Fall, shoulder deformity EXAM: RIGHT SHOULDER - 1 VIEW COMPARISON:  None Available. FINDINGS: Surgical neck fracture proximal humerus displaced up to 2.5 cm. No other fracture is observed. IMPRESSION: 1. Surgical neck fracture right proximal humerus displaced up to 2.5 cm. Electronically Signed   By: Ryan Salvage M.D.   On: 11/11/2023 15:23    Procedures Procedures    Medications Ordered in ED Medications  insulin  aspart (novoLOG ) injection 0-15 Units (15 Units Subcutaneous Given 11/11/23 1557)  insulin  glargine-yfgn (SEMGLEE ) injection 5 Units (has no administration in time range)  sodium chloride  flush (NS) 0.9 % injection 3 mL (has no administration in time range)  acetaminophen  (TYLENOL ) tablet 650 mg (has no administration in time range)    Or  acetaminophen  (TYLENOL ) suppository 650 mg (has no administration in time range)  HYDROcodone -acetaminophen  (NORCO/VICODIN) 5-325 MG per tablet 1-2 tablet (has no administration in time range)  morphine  (PF) 2 MG/ML injection 2 mg (has  no administration in time range)  ondansetron  (ZOFRAN ) tablet 4 mg (has no administration in time range)    Or  ondansetron  (ZOFRAN ) injection 4 mg (has no administration in time range)  morphine  (PF) 4 MG/ML injection 4 mg (4 mg Intravenous Given 11/11/23 1503)  ondansetron  (ZOFRAN ) injection 4 mg (4 mg Intravenous Given 11/11/23 1503)  lactated ringers  bolus 1,000 mL (0 mLs Intravenous Stopped 11/11/23 1845)  iohexol  (OMNIPAQUE ) 300 MG/ML solution 100 mL (80 mLs Intravenous Contrast Given 11/11/23 1643)  lactated ringers  bolus 1,000 mL (1,000 mLs Intravenous New  Bag/Given 11/11/23 1835)  morphine  (PF) 4 MG/ML injection 4 mg (4 mg Intravenous Given 11/11/23 1836)    ED Course/ Medical Decision Making/ A&P                          Medical Decision Making Amount and/or Complexity of Data Reviewed Labs: ordered. Decision-making details documented in ED Course. Radiology: ordered. Decision-making details documented in ED Course.  Risk Prescription drug management. Decision regarding hospitalization.    This patient presents to the ED for concern of presumed fall and right shoulder pain, this involves an extensive number of treatment options, and is a complaint that carries with it a high risk of complications and morbidity.  I considered the following differential and admission for this acute, potentially life threatening condition.  She is hemodynamically stable and otherwise well-appearing.  MDM:    Patient experienced a fall today with complete amnesia to the event.  Consider possible high risk syncope, seizure, or fall with head trauma and mild TBI.  She is currently A&O x 4.  Did not have loss of bladder or bowel, did not bite her tongue.  Has no history of seizures.  Also has no history of syncope.  Did not remember having any chest pain, shortness of breath, palpitations.  Does not remember feeling lightheaded.  She is noted to have significant hyperglycemia with no signs of DKA and will treat with fluids and insulin .  She has no chest pain to or shortness of breath indicate ACS or dissection. Low suspicion for PE given normal vital signs, absence of chest pain or dyspnea, no evidence of DVT, no recent surgery/immobilization.  Hemoglobin is stable.  No UTI.  Low concern for stroke given that she is focally neurologically intact.  No significant electrolyte derangements.  Leukocytosis could be due to trauma.  From a traumatic standpoint, she has a near 2 part surgical neck fracture of the right proximal humerus that is displaced.  It is closed.  She  has no other traumatic injuries. No concern for compartment syndrome or neurovascular injury at this time.  Will manage with IV analgesia and a sling. Will c/w ortho. CT head and C-spine reassuringly negative.   Clinical Course as of 11/11/23 1907  Sun Nov 11, 2023  1516 WBC(!): 13.7 +Leukocytosis [HN]  1516 DG Shoulder Right Portable +humeral neck fx [HN]  1536 DG Chest Portable 1 View 1. Displaced right proximal humeral surgical neck fracture. 2. No acute cardiopulmonary findings. 3. Mild lower thoracic spondylosis.   [HN]  1536 DG Pelvis Portable 1. No pelvic fracture or acute bony finding identified. 2. Chronic degenerative disc disease at L4-5.   [HN]  1536 DG Elbow 2 Views Right 1. Displaced surgical neck fracture of the right proximal humerus. 2. Very limited ability to move the elbow due to the proximal humeral fracture. No obvious/gross elbow fracture observed. Indeterminate for elbow effusion, adversely  affected negative predictive value due to positioning.   [HN]  1542 Glucose(!!): 540 Will give fluids/insulin  [HN]  1622 CO2: 22 No AGMA, no c/f DKA [HN]  1730 Urinalysis, Routine w reflex microscopic -Urine, Clean Catch(!) Neg for uti [HN]  1745 CT CHEST ABDOMEN PELVIS W CONTRAST [HN]  1745 CT CHEST ABDOMEN PELVIS W CONTRAST 1. Neer 2-part surgical neck fracture of the right proximal humerus displaced 1.7 cm. 2. No additional acute findings in the chest, abdomen, or pelvis. 3. Moderate-sized hiatal hernia. 4. Degenerative facet arthropathy bilaterally at L4-5 with grade 1 degenerative anterolisthesis of L4 on L5. Moderate right foraminal stenosis at L4-5 due to disc bulge, facet arthropathy, and disc uncovering. 5.  Aortic Atherosclerosis (ICD10-I70.0).   [HN]  1800 Discussed with Dr. Emmit with orthopedic surgery who is aware of patient.  Recommends shoulder sling.  Will consult with hospitalist for admission for possible high risk syncope, fall with amnesia  to the event. [HN]    Clinical Course User Index [HN] Franklyn Sid SAILOR, MD    Labs: I Ordered, and personally interpreted labs.  The pertinent results include:  those listed above  Imaging Studies ordered: I ordered imaging studies including CTH, CT C-spine, CT C/A/P, RUE XRs, CXR, PXR I independently visualized and interpreted imaging. I agree with the radiologist interpretation  Additional history obtained from chart review.   Cardiac Monitoring: The patient was maintained on a cardiac monitor.  I personally viewed and interpreted the cardiac monitored which showed an underlying rhythm of: NSR  Reevaluation: After the interventions noted above, I reevaluated the patient and found that they have :improved  Social Determinants of Health: Lives independently  Disposition: Admit to hospitalist  Co morbidities that complicate the patient evaluation  Past Medical History:  Diagnosis Date   Arthritis    bilateral knees and back;   Depression    on meds, working well   Diabetes mellitus without complication (HCC) 05/31/2012   on meds   Heart palpitations    rapid   Hyperlipidemia    on meds   Hypertension    on meds     Medicines Meds ordered this encounter  Medications   morphine  (PF) 4 MG/ML injection 4 mg   ondansetron  (ZOFRAN ) injection 4 mg   lactated ringers  bolus 1,000 mL   insulin  aspart (novoLOG ) injection 0-15 Units    Correction coverage::   Moderate (average weight, post-op)    CBG < 70::   Implement Hypoglycemia Standing Orders and refer to Hypoglycemia Standing Orders sidebar report    CBG 70 - 120::   0 units    CBG 121 - 150::   2 units    CBG 151 - 200::   3 units    CBG 201 - 250::   5 units    CBG 251 - 300::   8 units    CBG 301 - 350::   11 units    CBG 351 - 400::   15 units    CBG > 400:   call MD and obtain STAT lab verification   iohexol  (OMNIPAQUE ) 300 MG/ML solution 100 mL   lactated ringers  bolus 1,000 mL   morphine  (PF) 4 MG/ML  injection 4 mg   insulin  glargine-yfgn (SEMGLEE ) injection 5 Units   sodium chloride  flush (NS) 0.9 % injection 3 mL   OR Linked Order Group    acetaminophen  (TYLENOL ) tablet 650 mg    acetaminophen  (TYLENOL ) suppository 650 mg   HYDROcodone -acetaminophen  (NORCO/VICODIN) 5-325 MG per  tablet 1-2 tablet    Refill:  0   morphine  (PF) 2 MG/ML injection 2 mg   OR Linked Order Group    ondansetron  (ZOFRAN ) tablet 4 mg    ondansetron  (ZOFRAN ) injection 4 mg    I have reviewed the patients home medicines and have made adjustments as needed  Problem List / ED Course: Problem List Items Addressed This Visit       Other   Hyperglycemia   * (Principal) Syncope and collapse - Primary   Other Visit Diagnoses       Closed 2-part displaced fracture of surgical neck of right humerus, initial encounter                       This note was created using dictation software, which may contain spelling or grammatical errors.    Franklyn Sid SAILOR, MD 11/11/23 [phone]

## 2023-11-11 NOTE — Assessment & Plan Note (Signed)
 Continue home atenolol

## 2023-11-11 NOTE — Assessment & Plan Note (Addendum)
 She has poorly controlled diabetes. No AG, normal bicarb. 5 ketones in urine.  Received 1L IVF in ED, give an additional liter  States she is compliant with her insulin  regimen at home  Son states she has been eating poorly and lots of treats/sugars  Start long acting insulin  at 5 units, moderate SSI QAC/HS  Will repeat bmp to make sure not heading into DKA  A1C of >15.5 in 06/2023  Diabetic educator

## 2023-11-11 NOTE — Assessment & Plan Note (Signed)
Continue crestor 20mg daily  

## 2023-11-11 NOTE — Assessment & Plan Note (Addendum)
 Ortho consulted and will see her inpatient Pain control with oral pain medication and morphine  Ice as needed  Will need OT depending on ortho surgery plans

## 2023-11-11 NOTE — Assessment & Plan Note (Signed)
>>  ASSESSMENT AND PLAN FOR UNCONTROLLED TYPE 2 DIABETES MELLITUS WITH HYPERGLYCEMIA, WITH LONG-TERM CURRENT USE OF INSULIN  (HCC) WRITTEN ON 11/11/2023  7:18 PM BY WOLFE, ALLISON, MD  She has poorly controlled diabetes. No AG, normal bicarb. 5 ketones in urine.  Received 1L IVF in ED, give an additional liter  States she is compliant with her insulin  regimen at home  Son states she has been eating poorly and lots of treats/sugars  Start long acting insulin  at 5 units, moderate SSI QAC/HS  Will repeat bmp to make sure not heading into DKA  A1C of >15.5 in 06/2023  Diabetic educator

## 2023-11-11 NOTE — Progress Notes (Signed)
 Orthopedic Tech Progress Note Patient Details:  Jennifer Ingram 03/28/1951 [id number] Went to patients room to apply sling and she stated she was in too much pain. Spoke to RN who said he would administer pain meds and call us  back to apply sling or if ortho tech was gone by that time that he would apply the sling. Patient ID: Jennifer Ingram, female   DOB: May 07, 1950, 73 y.o.   MRN: [id number]  Jennifer Ingram December 11/11/2023, 6:35 PM

## 2023-11-11 NOTE — ED Triage Notes (Signed)
 Pt bib [facility] for syncopal episode this morning. Son called 911. Patietn doesn't remember falling or having syncopal episode. Obvious deformity to right shoulder.  75mcg of fentanyl.

## 2023-11-11 NOTE — Assessment & Plan Note (Signed)
>>  ASSESSMENT AND PLAN FOR 2-PART DISPLACED FRACTURE OF SURGICAL NECK OF RIGHT HUMERUS, INITIAL ENCOUNTER FOR CLOSED FRACTURE WRITTEN ON 11/11/2023  7:27 PM BY WADDELL RAKE, MD  Ortho consulted and will see her inpatient Pain control with oral pain medication and morphine  Ice as needed  Will need OT depending on ortho surgery plans

## 2023-11-11 NOTE — Assessment & Plan Note (Addendum)
 73 year old female presenting to ED after syncopal episode at home which she has no recollection of any event leading up to this.  -admit to tele  -syncope work up -head CT with no acute findings  -no acute EKG changes, troponin pending  -check orthostatics  -TED hose -echo -check brain MRI with amnesia, metabolic labs, no signs of infectious source  -check CK  -consider EEG  -hyperglycemic, but A1C of >15.5 so likely runs high. No AG or criteria for DKA, but will follow closely for early DKA -continue IVF  -son states she has history of vertigo and getting dizzy on standing. F/u on orthostatics, may benefit from vestibular PT. Numerous falls, PT eval

## 2023-11-11 NOTE — Assessment & Plan Note (Signed)
 Continue paxil daily

## 2023-11-11 NOTE — Assessment & Plan Note (Signed)
 Better than baseline (1.1-1.2)  Continue to monitor

## 2023-11-12 ENCOUNTER — Inpatient Hospital Stay (HOSPITAL_COMMUNITY)

## 2023-11-12 DIAGNOSIS — R55 Syncope and collapse: Secondary | ICD-10-CM

## 2023-11-12 DIAGNOSIS — S0990XA Unspecified injury of head, initial encounter: Secondary | ICD-10-CM | POA: Diagnosis not present

## 2023-11-12 LAB — CBC
HCT: 38.8 % (ref 36.0–46.0)
Hemoglobin: 13.2 g/dL (ref 12.0–15.0)
MCH: 30.7 pg (ref 26.0–34.0)
MCHC: 34 g/dL (ref 30.0–36.0)
MCV: 90.2 fL (ref 80.0–100.0)
Platelets: 265 K/uL (ref 150–400)
RBC: 4.3 MIL/uL (ref 3.87–5.11)
RDW: 12.3 % (ref 11.5–15.5)
WBC: 13.6 K/uL — ABNORMAL HIGH (ref 4.0–10.5)
nRBC: 0 % (ref 0.0–0.2)

## 2023-11-12 LAB — ECHOCARDIOGRAM COMPLETE
AR max vel: 1.01 cm2
AV Area VTI: 1.07 cm2
AV Area mean vel: 0.99 cm2
AV Mean grad: 10.5 mmHg
AV Peak grad: 19.4 mmHg
Ao pk vel: 2.2 m/s
Area-P 1/2: 3.07 cm2
Calc EF: 63.2 %
Height: 60 in
S' Lateral: 2 cm
Single Plane A2C EF: 63.4 %
Single Plane A4C EF: 62.4 %
Weight: 2352.75 [oz_av]

## 2023-11-12 LAB — GLUCOSE, CAPILLARY
Glucose-Capillary: 333 mg/dL — ABNORMAL HIGH (ref 70–99)
Glucose-Capillary: 355 mg/dL — ABNORMAL HIGH (ref 70–99)
Glucose-Capillary: 161 mg/dL — ABNORMAL HIGH (ref 70–99)
Glucose-Capillary: 226 mg/dL — ABNORMAL HIGH (ref 70–99)
Glucose-Capillary: 154 mg/dL — ABNORMAL HIGH (ref 70–99)

## 2023-11-12 LAB — BASIC METABOLIC PANEL WITH GFR
Anion gap: 9 (ref 5–15)
BUN: 25 mg/dL — ABNORMAL HIGH (ref 8–23)
CO2: 28 mmol/L (ref 22–32)
Calcium: 8.6 mg/dL — ABNORMAL LOW (ref 8.9–10.3)
Chloride: 99 mmol/L (ref 98–111)
Creatinine, Ser: 1.03 mg/dL — ABNORMAL HIGH (ref 0.44–1.00)
GFR, Estimated: 58 mL/min — ABNORMAL LOW (ref >60)
Glucose, Bld: 344 mg/dL — ABNORMAL HIGH (ref 70–99)
Potassium: 4.2 mmol/L (ref 3.5–5.1)
Sodium: 136 mmol/L (ref 135–145)

## 2023-11-12 LAB — RAPID URINE DRUG SCREEN, HOSP PERFORMED
Amphetamines: NOT DETECTED
Barbiturates: NOT DETECTED
Benzodiazepines: NOT DETECTED
Cocaine: NOT DETECTED
Opiates: POSITIVE — AB
Tetrahydrocannabinol: NOT DETECTED

## 2023-11-12 MED ORDER — LORAZEPAM 2 MG/ML IJ SOLN
0.5000 mg | Freq: Once | INTRAMUSCULAR | Status: AC
  Administered 2023-11-12: 0.5 mg via INTRAVENOUS
  Filled 2023-11-12: qty 1

## 2023-11-12 MED ORDER — PERFLUTREN LIPID MICROSPHERE
1.0000 mL | INTRAVENOUS | Status: AC | PRN
  Administered 2023-11-12: 2 mL via INTRAVENOUS

## 2023-11-12 MED ORDER — INSULIN ASPART 100 UNIT/ML IJ SOLN
5.0000 [IU] | Freq: Three times a day (TID) | INTRAMUSCULAR | Status: DC
  Administered 2023-11-12 – 2023-11-16 (×8): 5 [IU] via SUBCUTANEOUS

## 2023-11-12 MED ORDER — INSULIN GLARGINE-YFGN 100 UNIT/ML ~~LOC~~ SOLN
15.0000 [IU] | Freq: Every day | SUBCUTANEOUS | Status: DC
  Administered 2023-11-13 – 2023-11-16 (×4): 15 [IU] via SUBCUTANEOUS
  Filled 2023-11-12 (×4): qty 0.15

## 2023-11-12 MED ORDER — KETOROLAC TROMETHAMINE 15 MG/ML IJ SOLN
15.0000 mg | Freq: Four times a day (QID) | INTRAMUSCULAR | Status: DC | PRN
  Administered 2023-11-12 – 2023-11-15 (×5): 15 mg via INTRAVENOUS
  Filled 2023-11-12 (×5): qty 1

## 2023-11-12 MED ORDER — LACTATED RINGERS IV BOLUS
500.0000 mL | Freq: Once | INTRAVENOUS | Status: AC
  Administered 2023-11-12: 500 mL via INTRAVENOUS

## 2023-11-12 NOTE — Progress Notes (Signed)
 Patient ID: Jennifer Ingram, female   DOB: 1950/12/25, 73 y.o.   MRN: [id number]  Consult received for her right proximal humerus fracture Admitted due to syncopal episode and lack of recall of her fall  I will discuss her with one of my partners to determine surgical need and timing as surgery likely indicated  Full note will follow with plans

## 2023-11-12 NOTE — TOC Initial Note (Signed)
 Transition of Care [facility]) - Initial/Assessment Note    Patient Details  Name: Jennifer Ingram MRN: [id number] Date of Birth: 05/13/1950  Transition of [facility]) CM/SW Contact:    Sheri ONEIDA Sharps, LCSW Phone Number: 11/12/2023, 4:32 PM  Clinical Narrative:                 Pt from home w/ son. Pt recommended for SNF. PASRR obtained and FL2 completed. SNF referral faxed out; awaiting bed offers.   Expected Discharge Plan: Skilled Nursing Facility Barriers to Discharge: Continued Medical Work up   Patient Goals and CMS Choice Patient states their goals for this hospitalization and ongoing recovery are:: return home following STR CMS Medicare.gov Compare Post Acute Care list provided to:: Patient Choice offered to / list presented to : Patient [place] ownership interest in [facility].provided to:: Patient    Expected Discharge Plan and Services In-house Referral: NA Discharge Planning Services: NA Post Acute Care Choice: Skilled Nursing Facility Living arrangements for the past 2 months: Mobile Home                 DME Arranged: N/A DME Agency: NA       HH Arranged: NA HH Agency: NA        Prior Living Arrangements/Services Living arrangements for the past 2 months: Mobile Home Lives with:: Adult Children Patient language and need for interpreter reviewed:: Yes Do you feel safe going back to the place where you live?: Yes      Need for Family Participation in Patient Care: Yes (Comment) Care giver support system in place?: Yes (comment)   Criminal Activity/Legal Involvement Pertinent to Current Situation/Hospitalization: No - Comment as needed  Activities of Daily Living   ADL Screening (condition at time of admission) Independently performs ADLs?: Yes (appropriate for developmental age) Is the patient deaf or have difficulty hearing?: No Does the patient have difficulty seeing, even when wearing glasses/contacts?: No Does the patient have difficulty  concentrating, remembering, or making decisions?: No  Permission Sought/Granted                  Emotional Assessment Appearance:: Appears stated age Attitude/Demeanor/Rapport: Engaged Affect (typically observed): Accepting Orientation: : Oriented to Self, Oriented to Place, Oriented to  Time, Oriented to Situation Alcohol / Substance Use: Not Applicable Psych Involvement: No (comment)  Admission diagnosis:  Syncope and collapse [R55] Hyperglycemia [R73.9] Closed 2-part displaced fracture of surgical neck of right humerus, initial encounter [D57.778J] Patient Active Problem List   Diagnosis Date Noted   Syncope and collapse 11/11/2023   2-part displaced fracture of surgical neck of right humerus, initial encounter for closed fracture 11/11/2023   Hyperglycemia 07/20/2023   DKA (diabetic ketoacidosis) (HCC) 07/19/2023   Acute encephalopathy 07/19/2023   Unsteady gait 01/05/2023   Orthostatic hypotension 01/04/2023   Hypokalemia 01/04/2023   CKD stage 3b, GFR 30-44 ml/min (HCC) 01/04/2023   Leukocytosis 01/04/2023   Uncontrolled type 2 diabetes mellitus with hyperglycemia, with long-term current use of insulin  (HCC) 01/04/2023   Systolic murmur 10/10/2022   Diminished pulses in lower extremity 11/30/2021   Pain due to onychomycosis of toenails of both feet 02/14/2021   Chronic kidney disease 12/20/2020   Depression 12/17/2020   Diabetes mellitus with polyneuropathy (HCC) 12/17/2020   Heart palpitations 12/17/2020   Hypothyroid 12/17/2020   Hypertension 12/17/2020   Hyperlipidemia 12/17/2020   Intertrigo 12/17/2020   PCP:  Patient, No Pcp Per Pharmacy:   [facility] [phone] -  [place], [place] - [address] [place] [address] Phone: [phone] Fax: [phone]  [facility] - [facility] [address] [place] [address] Phone: [phone] Fax: [phone]     Social Drivers of Health  (SDOH) Social History: SDOH Screenings   Food Insecurity: No Food Insecurity (11/11/2023)  Housing: Low Risk  (11/11/2023)  Transportation Needs: No Transportation Needs (11/11/2023)  Utilities: Not At Risk (11/11/2023)  Alcohol Screen: Low Risk  (10/10/2022)  Depression (PHQ2-9): Low Risk  (10/10/2022)  Physical Activity: Sufficiently Active (10/10/2022)  Social Connections: Moderately Integrated (11/11/2023)  Stress: No Stress Concern Present (10/10/2022)  Tobacco Use: Low Risk  (11/11/2023)   SDOH Interventions:     Readmission Risk Interventions    11/12/2023    4:30 PM 07/21/2023    9:11 AM 01/06/2023    1:24 PM  Readmission Risk Prevention Plan  Post Dischage Appt Complete    Medication Screening Complete    Transportation Screening Complete Complete Complete  PCP or Specialist Appt within 5-7 Days  Complete Complete  Home Care Screening  Complete Complete  Medication Review (RN CM)  Complete Complete

## 2023-11-12 NOTE — Inpatient Diabetes Management (Signed)
 Inpatient Diabetes Program Recommendations  AACE/ADA: New Consensus Statement on Inpatient Glycemic Control (2015)  Target Ranges:  Prepandial:   less than 140 mg/dL      Peak postprandial:   less than 180 mg/dL (1-2 hours)      Critically ill patients:  140 - 180 mg/dL   Lab Results  Component Value Date   GLUCAP 355 (H) 11/12/2023   HGBA1C >15.5 (H) 07/20/2023    Review of Glycemic Control  Latest Reference Range & Units 11/11/23 14:28  Glucose 70 - 99 mg/dL 459 (HH)  (HH): Data is critically high  Diabetes history: DM2  Outpatient Diabetes medications:  70/30 10 units BID Freestyle Libre 3 CGM  Current orders for Inpatient glycemic control:  Semglee  5 units every day Novolog  0-15 units TID  Inpatient Diabetes Program Recommendations:    Semlgee 12 units every day Novolog  3 units TID  Received DM consult for A1C >10%.  DM team spoke with her on 07/23/23 with that A1C was >15.5%.  Please consider ordering a current A1C.  Thank you, Wyvonna Pinal, MSN, CDCES Diabetes Coordinator Inpatient Diabetes Program [phone] (team pager from 8a-5p)

## 2023-11-12 NOTE — Evaluation (Addendum)
 Physical Therapy Evaluation Patient Details Name: Jennifer Ingram MRN: [id number] DOB: March 17, 1951 Today's Date: 11/12/2023  History of Present Illness  Pt is 73 yo female presented on 11/11/23 after syncope and collapse - treated with IV fluids.  Pt found to have R proximal humerus fx with orthopedic consult -surgery likely indicated. Pt with hx including but not limited to T2DM, HLD, HTN, CKD stage 3b, hypothyroidism, depression.  Noted earlier hospital admission for orthostatic hypotension and pt also reports hx of vertigo.  Clinical Impression  Pt admitted with above diagnosis. At baseline, pt reports ambulatory without AD and lives with her son who works part-time.  Today, pt extremely lethargic that did not improve at EOB and limited ability to participate.  After session, RN reports pt received Ativan  for MRI earlier.  Attempted to assess orthostatic BP but only able to get supine and sitting (see below).  Pt does have hx of orthostatic hypotension but also reports vertigo.  Could benefit from further vestibular assessment (unable due to lethargy today) ; however, this episode seems more likely related to hypotension. Pt currently with functional limitations due to the deficits listed below (see PT Problem List). Pt will benefit from acute skilled PT to increase their independence and safety with mobility to allow discharge.  Additionally, pt could likely benefit from OT consult in future depending on timing of surgery and alertness.                HR   BP Supine   78   93/60 Sitting    88    112/83 Supine   83    127/65         If plan is discharge home, recommend the following: Two people to help with walking and/or transfers;Two people to help with bathing/dressing/bathroom   Can travel by private vehicle   No    Equipment Recommendations Wheelchair cushion (measurements PT);Wheelchair (measurements PT);Hospital bed (needs further assessment when more alert)  Recommendations for  Other Services       Functional Status Assessment Patient has had a recent decline in their functional status and demonstrates the ability to make significant improvements in function in a reasonable and predictable amount of time.     Precautions / Restrictions Precautions Precautions: Fall Precaution/Restrictions Comments: R UE in sling Required Braces or Orthoses: Sling  (noted from ortho tech pt unable to tolerate sling placement yesterday, she does have a wrap sling today, but not traditional sling in place) Restrictions Weight Bearing Restrictions Per Provider Order: Yes RUE Weight Bearing Per Provider Order: Non weight bearing      Mobility  Bed Mobility Overal bed mobility: Needs Assistance Bed Mobility: Rolling, Supine to Sit, Sit to Supine Rolling: Total assist   Supine to sit: Total assist Sit to supine: Total assist   General bed mobility comments: limited by lethargy    Transfers                        Ambulation/Gait                  Stairs            Wheelchair Mobility     Tilt Bed    Modified Rankin (Stroke Patients Only)       Balance Overall balance assessment: Needs assistance Sitting-balance support: No upper extremity supported Sitting balance-Leahy Scale: Zero Sitting balance - Comments: Pt lethargic and leaning to L and posterior requiring total assist  Pertinent Vitals/Pain Pain Assessment Pain Assessment: 0-10 Pain Score: 10-Worst pain ever Pain Location: R arm Pain Descriptors / Indicators: Grimacing, Discomfort, Sharp Pain Intervention(s): Limited activity within patient's tolerance, Monitored during session, Repositioned, Other (comment) (pt reports severe pain, did grimace some with transfers but at rest lethargic and falling asleep/no signs of pain)    Home Living Family/patient expects to be discharged to:: Private residence Living Arrangements:  Children (son) Available Help at Discharge: Family;Available PRN/intermittently Type of Home: Mobile home Home Access: Stairs to enter Entrance Stairs-Rails: Doctor, general practice of Steps: 4   Home Layout: One level Home Equipment: Cane - single Librarian, academic (2 wheels)      Prior Function Prior Level of Function : Independent/Modified Independent             Mobility Comments: pt reports ind with community amb, did not use AD,  no recent falls other than this one associated with syncope ADLs Comments: pt reports ind with ADLs/IADLs     Extremity/Trunk Assessment   Upper Extremity Assessment Upper Extremity Assessment: RUE deficits/detail RUE Deficits / Details: In sling    Lower Extremity Assessment Lower Extremity Assessment: LLE deficits/detail;RLE deficits/detail;Generalized weakness;Difficult to assess due to impaired cognition RLE Deficits / Details: ROM: Dorsiflexion tight - limited to neutral only; otherwise ROM WFL; MMT at least 3/5 throughout but unable to further assess due to lethargy LLE Deficits / Details: ROM: Dorsiflexion tight - limited to neutral only; otherwise ROM WFL; MMT at least 3/5 throughout but unable to further assess due to lethargy    Cervical / Trunk Assessment Cervical / Trunk Assessment: Kyphotic  Communication        Cognition Arousal: Lethargic, Suspect due to medications Behavior During Therapy: WFL for tasks assessed/performed   PT - Cognitive impairments: Difficult to assess Difficult to assess due to: Level of arousal                     PT - Cognition Comments: Spoke with RN and reports pt did receive Ativan  earlier prior to MRI and was more alert before that.  Pt able to provide history but with eyes closed.  Briefly opens eyes but cannot maintain open.  Had hoped for improved alertness in sitting but still lethargic         Cueing       General Comments General comments (skin integrity, edema,  etc.): Pt lethargic at arrival but answering questions.  Transitioned to EOB with total assist in hopes to increase alertness but did not; not able to maintain balance at EOB so returned to supine.  Nursing reporting after session that pt had been given Ativan .    In regards to orthostatic and vertigo assessment.  BP not significantly orthostatic between supine to sit but unable to progress further.  Pt does report hx of orthostatic hypotension as well as vertigo spinning.  She reports vertigo with transfers but difficulty further specifying b/c of lethargy.  Also, unable to participate in any vestibular and ocular motor assessment due to lethargy.    Exercises     Assessment/Plan    PT Assessment Patient needs continued PT services  PT Problem List Decreased strength;Decreased range of motion;Decreased cognition;Decreased activity tolerance;Decreased knowledge of use of DME;Decreased balance;Decreased safety awareness;Decreased mobility;Decreased knowledge of precautions       PT Treatment Interventions DME instruction;Therapeutic exercise;Gait training;Balance training;Neuromuscular re-education;Functional mobility training;Therapeutic activities;Patient/family education;Cognitive remediation;Modalities    PT Goals (Current goals can be found in the Care Plan section)  Acute Rehab PT Goals Patient Stated Goal: not able to state PT Goal Formulation: Patient unable to participate in goal setting Time For Goal Achievement: 11/26/23 Potential to Achieve Goals: Good    Frequency Min 2X/week     Co-evaluation               AM-PAC PT 6 Clicks Mobility  Outcome Measure Help needed turning from your back to your side while in a flat bed without using bedrails?: Total Help needed moving from lying on your back to sitting on the side of a flat bed without using bedrails?: Total Help needed moving to and from a bed to a chair (including a wheelchair)?: Total Help needed standing up  from a chair using your arms (e.g., wheelchair or bedside chair)?: Total Help needed to walk in hospital room?: Total Help needed climbing 3-5 steps with a railing? : Total 6 Click Score: 6    End of Session   Activity Tolerance: Patient limited by lethargy Patient left: in bed;with call bell/phone within reach Nurse Communication: Mobility status PT Visit Diagnosis: Other abnormalities of gait and mobility (R26.89);Muscle weakness (generalized) (M62.81)    Time: 8867-8842 PT Time Calculation (min) (ACUTE ONLY): 25 min   Charges:   PT Evaluation $PT Eval Low Complexity: 1 Low PT Treatments $Therapeutic Activity: 8-22 mins PT General Charges $$ ACUTE PT VISIT: 1 Visit         Benjiman, PT Acute Rehab [facility] Rehab [phone]   Benjiman VEAR [place]berry 11/12/2023, 12:17 PM

## 2023-11-12 NOTE — Progress Notes (Signed)
 PROGRESS NOTE    Jennifer Ingram  FMW:[id number] DOB: 09-Jul-1950 DOA: 11/11/2023 PCP: Patient, No Pcp Per   Brief Narrative:   73 y.o. female with medical history significant of T2DM, HLD, HTN, CKD stage 3b, hypothyroidism, depression presented with syncope and right shoulder pain after a fall.  On presentation, WBC was 13.7, CBG of 554.  CT head and cervical spine showed no acute findings.  CT of chest/abdomen/pelvis showed near 2 part surgical neck fracture of the right proximal humerus displaced with moderate sized hiatal hernia and degenerative facet arthropathy bilaterally at L4-5 with grade 1 degenerative anterolisthesis of L4 on L5 with moderate right foraminal stenosis at L4-5 due to disc bulge along with aortic atherosclerosis.  Pelvis x-ray showed no acute findings.  Orthopedics was consulted.  Assessment & Plan:   Syncope and collapse -Continue telemetry monitoring.  Fall precautions.  No syncope since admission.  No acute ischemic changes on EKG; troponin normal.  Echo pending.  Vitamin B12 and TSH normal.  CK total normal. - Treated with some IV fluids. - Fall precautions.  PT eval - MRI of brain pending.  2 part displaced fracture of surgical neck of right humerus - Follow orthopedics recommendations regarding timing of surgical intervention.  Continue pain management.  Ice as needed.  Diabetes mellitus type 2 with hyperglycemia - A1c more than 15.5 in 3/125.  Diabetes coordinator consult.  Increase long-acting insulin .  Continue CBGs with SSI.  Leukocytosis -Possibly reactive.  Monitor  CKD stage IIIb - Creatinine stable.  Baseline creatinine of 1.1-1.2  Hypertension Hyperlipidemia -Continue atenolol  and statin  Hypothyroidism -Continue levothyroxine   Anxiety and depression - Continue paroxetine   DVT prophylaxis: SCDs Code Status: Full Family Communication: None at bedside Disposition Plan: Status is: Inpatient Remains inpatient appropriate because: Of  severity of illness    Consultants: Orthopedics  Procedures: None  Antimicrobials: None   Subjective: Patient seen and examined at bedside.  Complains of right shoulder pain.  Denies any current chest pain, shortness of breath, fever or vomiting.  Objective: Vitals:   11/11/23 2011 11/12/23 0105 11/12/23 0408 11/12/23 0500  BP: (!) 166/101 112/68 114/67   Pulse: 99 78 78   Resp: 18 20 18    Temp: 98.2 F (36.8 C) 98.2 F (36.8 C) 98.3 F (36.8 C)   TempSrc:      SpO2: 92% 93% (!) 87%   Weight:   66 kg 66.7 kg  Height:        Intake/Output Summary (Last 24 hours) at 11/12/2023 0733 Last data filed at 11/12/2023 0557 Gross per 24 hour  Intake 2000 ml  Output --  Net 2000 ml   Filed Weights   11/11/23 1705 11/12/23 0408 11/12/23 0500  Weight: 61.9 kg 66 kg 66.7 kg    Examination:  General exam: Appears calm and comfortable  Respiratory system: Bilateral decreased breath sounds at bases Cardiovascular system: S1 & S2 heard, Rate controlled Gastrointestinal system: Abdomen is nondistended, soft and nontender. Normal bowel sounds heard. Extremities: No cyanosis, clubbing, edema.  Right shoulder is in a sling. Central nervous system: Alert and oriented. No focal neurological deficits. Moving extremities Skin: No rashes, lesions or ulcers Psychiatry: Flat affect.  Not agitated.    Data Reviewed: I have personally reviewed following labs and imaging studies  CBC: Recent Labs  Lab 11/11/23 1428 11/12/23 0513  WBC 13.7* 13.6*  NEUTROABS 11.0*  --   HGB 14.8 13.2  HCT 43.9 38.8  MCV 89.0 90.2  PLT 234 265  Basic Metabolic Panel: Recent Labs  Lab 11/11/23 1428 11/11/23 1902 11/12/23 0513  NA 133* 136 136  K 4.0 3.9 4.2  CL 98 103 99  CO2 22 23 28   GLUCOSE 540* 385* 344*  BUN 23 23 25*  CREATININE 1.06* 0.98 1.03*  CALCIUM  8.3* 8.6* 8.6*   GFR: Estimated Creatinine Clearance: 42.7 mL/min (A) (by C-G formula based on SCr of 1.03 mg/dL (H)). Liver  Function Tests: Recent Labs  Lab 11/11/23 1428  AST 17  ALT 10  ALKPHOS 75  BILITOT 0.8  PROT 6.6  ALBUMIN 3.5   No results for input(s): LIPASE, AMYLASE in the last 168 hours. No results for input(s): AMMONIA in the last 168 hours. Coagulation Profile: Recent Labs  Lab 11/11/23 1428  INR 1.0   Cardiac Enzymes: Recent Labs  Lab 11/11/23 1428  CKTOTAL 48   BNP (last 3 results) No results for input(s): PROBNP in the last 8760 hours. HbA1C: No results for input(s): HGBA1C in the last 72 hours. CBG: Recent Labs  Lab 11/11/23 1555 11/11/23 1714 11/11/23 1858 11/11/23 2050 11/12/23 0503  GLUCAP 584* 554* 360* 288* 333*   Lipid Profile: No results for input(s): CHOL, HDL, LDLCALC, TRIG, CHOLHDL, LDLDIRECT in the last 72 hours. Thyroid  Function Tests: Recent Labs    11/11/23 1902  TSH 2.076   Anemia Panel: Recent Labs    11/11/23 1903  VITAMINB12 394   Sepsis Labs: No results for input(s): PROCALCITON, LATICACIDVEN in the last 168 hours.  No results found for this or any previous visit (from the past 240 hours).       Radiology Studies: CT CHEST ABDOMEN PELVIS W CONTRAST Result Date: 11/11/2023 CLINICAL DATA:  Syncope, right shoulder deformity. Earlier imaging today revealed a surgical neck fracture the right proximal humerus. EXAM: CT CHEST, ABDOMEN, AND PELVIS WITH CONTRAST TECHNIQUE: Multidetector CT imaging of the chest, abdomen and pelvis was performed following the standard protocol during bolus administration of intravenous contrast. RADIATION DOSE REDUCTION: This exam was performed according to the departmental dose-optimization program which includes automated exposure control, adjustment of the mA and/or kV according to patient size and/or use of iterative reconstruction technique. CONTRAST:  80mL OMNIPAQUE  IOHEXOL  300 MG/ML  SOLN COMPARISON:  Radiographs from 11/11/2023 and CT abdomen from 07/21/2023 FINDINGS: CT CHEST  FINDINGS Cardiovascular: Atheromatous vascular calcification the aortic arch. Mild aortic valve calcification. Mediastinum/Nodes: Moderate-sized hiatal hernia. Lungs/Pleura: Biapical pleuroparenchymal scarring. Dependent subsegmental atelectasis in both lower lobes. Mild scarring in the vicinity of the right minor fissure. Musculoskeletal: Mild lower thoracic spondylosis. Neer 2 part surgical neck fracture of the right proximal humerus displaced 1.7 cm CT ABDOMEN PELVIS FINDINGS Hepatobiliary: Cholecystectomy. Stable mild prominence of the common bile duct at 1.1 cm, likely a physiologic response to cholecystectomy. Pancreas: Unremarkable Spleen: Unremarkable Adrenals/Urinary Tract: Unremarkable Stomach/Bowel: Unremarkable Vascular/Lymphatic: Atherosclerosis is present, including aortoiliac atherosclerotic disease. Reproductive: Unremarkable Other: No supplemental non-categorized findings. Musculoskeletal: Degenerative facet arthropathy bilaterally at L4-5 with grade 1 degenerative anterolisthesis of L4 on L5. Moderate right foraminal stenosis at L4-5 due to disc bulge, facet arthropathy, and disc uncovering. IMPRESSION: 1. Neer 2-part surgical neck fracture of the right proximal humerus displaced 1.7 cm. 2. No additional acute findings in the chest, abdomen, or pelvis. 3. Moderate-sized hiatal hernia. 4. Degenerative facet arthropathy bilaterally at L4-5 with grade 1 degenerative anterolisthesis of L4 on L5. Moderate right foraminal stenosis at L4-5 due to disc bulge, facet arthropathy, and disc uncovering. 5.  Aortic Atherosclerosis (ICD10-I70.0). Electronically Signed   By:  Ryan Salvage M.D.   On: 11/11/2023 17:18   CT Head Wo Contrast Result Date: 11/11/2023 EXAM: CT HEAD AND CERVICAL SPINE 11/11/2023 05:01:25 PM TECHNIQUE: CT of the head and cervical spine was performed without the administration of intravenous contrast. Multiplanar reformatted images are provided for review. Automated exposure  control, iterative reconstruction, and/or weight based adjustment of the mA/kV was utilized to reduce the radiation dose to as low as reasonably achievable. COMPARISON: CT head 01/04/2023 CLINICAL HISTORY: Head trauma, minor (Age >= 65y). Pt bib gcems for syncopal episode this morning. Son called 911. Patietn doesn't remember falling or having syncopal episode. Obvious deformity to right shoulder. FINDINGS: CT HEAD BRAIN AND VENTRICLES: No acute intracranial hemorrhage. No mass effect or midline shift. No abnormal extra-axial fluid collection. Gray-white differentiation is maintained. No hydrocephalus. ORBITS: No acute abnormality. SINUSES AND MASTOIDS: No acute abnormality. SOFT TISSUES AND SKULL: No acute skull fracture. No acute soft tissue abnormality. CT CERVICAL SPINE BONES AND ALIGNMENT: No acute fracture or traumatic malalignment. 3 mm degenerative anterolisthesis of C4 on C5. DEGENERATIVE CHANGES: Degenerative endplate changes at C5-6. Left greater than right facet arthropathy at C4-5. SOFT TISSUES: No prevertebral soft tissue swelling. VASCULATURE: Atherosclerotic calcifications of the carotid bulbs. IMPRESSION: 1. No acute intracranial abnormality. 2. No acute fracture or traumatic malalignment of the cervical spine. Electronically signed by: Ryan Chess MD 11/11/2023 05:14 PM EDT RP Workstation: [id number]   CT Cervical Spine Wo Contrast Result Date: 11/11/2023 EXAM: CT HEAD AND CERVICAL SPINE 11/11/2023 05:01:25 PM TECHNIQUE: CT of the head and cervical spine was performed without the administration of intravenous contrast. Multiplanar reformatted images are provided for review. Automated exposure control, iterative reconstruction, and/or weight based adjustment of the mA/kV was utilized to reduce the radiation dose to as low as reasonably achievable. COMPARISON: CT head 01/04/2023 CLINICAL HISTORY: Head trauma, minor (Age >= 65y). Pt bib gcems for syncopal episode this morning. Son called 911.  Patietn doesn't remember falling or having syncopal episode. Obvious deformity to right shoulder. FINDINGS: CT HEAD BRAIN AND VENTRICLES: No acute intracranial hemorrhage. No mass effect or midline shift. No abnormal extra-axial fluid collection. Gray-white differentiation is maintained. No hydrocephalus. ORBITS: No acute abnormality. SINUSES AND MASTOIDS: No acute abnormality. SOFT TISSUES AND SKULL: No acute skull fracture. No acute soft tissue abnormality. CT CERVICAL SPINE BONES AND ALIGNMENT: No acute fracture or traumatic malalignment. 3 mm degenerative anterolisthesis of C4 on C5. DEGENERATIVE CHANGES: Degenerative endplate changes at C5-6. Left greater than right facet arthropathy at C4-5. SOFT TISSUES: No prevertebral soft tissue swelling. VASCULATURE: Atherosclerotic calcifications of the carotid bulbs. IMPRESSION: 1. No acute intracranial abnormality. 2. No acute fracture or traumatic malalignment of the cervical spine. Electronically signed by: Ryan Chess MD 11/11/2023 05:14 PM EDT RP Workstation: [id number]   DG Chest Portable 1 View Result Date: 11/11/2023 CLINICAL DATA:  Right proximal humeral fracture, fall EXAM: PORTABLE CHEST 1 VIEW COMPARISON:  12/28/2022 FINDINGS: The displaced right proximal humeral surgical neck fracture is observed. The lungs appear clear. Cardiac and mediastinal contours normal. No blunting of the costophrenic angles. Mild lower thoracic spondylosis. IMPRESSION: 1. Displaced right proximal humeral surgical neck fracture. 2. No acute cardiopulmonary findings. 3. Mild lower thoracic spondylosis. Electronically Signed   By: Ryan Salvage M.D.   On: 11/11/2023 15:28   DG Pelvis Portable Result Date: 11/11/2023 CLINICAL DATA:  Fall, right proximal humeral fracture EXAM: PORTABLE PELVIS 1-2 VIEWS COMPARISON:  CT pelvis 07/21/2023 FINDINGS: Chronic degenerative disc disease at L4-5. No pelvic fracture or  acute bony finding identified. IMPRESSION: 1. No pelvic fracture  or acute bony finding identified. 2. Chronic degenerative disc disease at L4-5. Electronically Signed   By: Ryan Salvage M.D.   On: 11/11/2023 15:26   DG Elbow 2 Views Right Result Date: 11/11/2023 CLINICAL DATA:  Deformity, fall EXAM: RIGHT ELBOW - 2 VIEW COMPARISON:  None Available. FINDINGS: Displaced surgical neck fracture the right proximal humerus. The patient has very limited ability to move the elbow due to the proximal humeral fracture. Both views are very limited and oblique. No obvious/gross elbow fracture observed. Indeterminate for elbow effusion. IMPRESSION: 1. Displaced surgical neck fracture of the right proximal humerus. 2. Very limited ability to move the elbow due to the proximal humeral fracture. No obvious/gross elbow fracture observed. Indeterminate for elbow effusion, adversely affected negative predictive value due to positioning. Electronically Signed   By: Ryan Salvage M.D.   On: 11/11/2023 15:25   DG Humerus Right Result Date: 11/11/2023 CLINICAL DATA:  Fall, shoulder deformity EXAM: RIGHT HUMERUS - 2+ VIEW COMPARISON:  None Available. FINDINGS: Displaced acute surgical neck fracture the right proximal humerus. No other fracture is identified. IMPRESSION: 1. Displaced acute surgical neck fracture of the right proximal humerus. Electronically Signed   By: Ryan Salvage M.D.   On: 11/11/2023 15:24   DG Shoulder Right Portable Result Date: 11/11/2023 CLINICAL DATA:  Fall, shoulder deformity EXAM: RIGHT SHOULDER - 1 VIEW COMPARISON:  None Available. FINDINGS: Surgical neck fracture proximal humerus displaced up to 2.5 cm. No other fracture is observed. IMPRESSION: 1. Surgical neck fracture right proximal humerus displaced up to 2.5 cm. Electronically Signed   By: Ryan Salvage M.D.   On: 11/11/2023 15:23        Scheduled Meds:  atenolol   50 mg Oral BID   gabapentin   300 mg Oral QHS   insulin  aspart  0-15 Units Subcutaneous TID WC   insulin   glargine-yfgn  5 Units Subcutaneous Daily   levothyroxine   50 mcg Oral Q0600   PARoxetine   40 mg Oral Daily   rosuvastatin   20 mg Oral Daily   sodium chloride  flush  3 mL Intravenous Q12H   Continuous Infusions:        Sophie Mao, MD [facility] 11/12/2023, 7:33 AM

## 2023-11-12 NOTE — NC FL2 (Signed)
 [place]  MEDICAID FL2 LEVEL OF CARE FORM     IDENTIFICATION  Patient Name: Jennifer Ingram Birthdate: Jul 25, 1950 Sex: female Admission Date (Current Location): 11/11/2023  [facility] and [place] Number:  Producer, television/film/video and Address:  [facility],  [address] [place] [address]      Provider Number: [id number]  Attending Physician Name and Address:  Cheryle Page, MD  Relative Name and Phone Number:       Current Level of Care: Hospital Recommended Level of Care: Skilled Nursing Facility Prior Approval Number:    Date Approved/Denied:   PASRR Number: [id number]  Discharge Plan: SNF    Current Diagnoses: Patient Active Problem List   Diagnosis Date Noted   Syncope and collapse 11/11/2023   2-part displaced fracture of surgical neck of right humerus, initial encounter for closed fracture 11/11/2023   Hyperglycemia 07/20/2023   DKA (diabetic ketoacidosis) (HCC) 07/19/2023   Acute encephalopathy 07/19/2023   Unsteady gait 01/05/2023   Orthostatic hypotension 01/04/2023   Hypokalemia 01/04/2023   CKD stage 3b, GFR 30-44 ml/min (HCC) 01/04/2023   Leukocytosis 01/04/2023   Uncontrolled type 2 diabetes mellitus with hyperglycemia, with long-term current use of insulin  (HCC) 01/04/2023   Systolic murmur 10/10/2022   Diminished pulses in lower extremity 11/30/2021   Pain due to onychomycosis of toenails of both feet 02/14/2021   Chronic kidney disease 12/20/2020   Depression 12/17/2020   Diabetes mellitus with polyneuropathy (HCC) 12/17/2020   Heart palpitations 12/17/2020   Hypothyroid 12/17/2020   Hypertension 12/17/2020   Hyperlipidemia 12/17/2020   Intertrigo 12/17/2020    Orientation RESPIRATION BLADDER Height & Weight     Self, Time, Situation, Place  O2 Incontinent Weight: 147 lb 0.8 oz (66.7 kg) Height:  5' (152.4 cm)  BEHAVIORAL SYMPTOMS/MOOD NEUROLOGICAL BOWEL NUTRITION STATUS      Continent Diet (see dc summary)  AMBULATORY STATUS  COMMUNICATION OF NEEDS Skin   Limited Assist Verbally Normal                       Personal Care Assistance Level of Assistance  Bathing, Dressing, Feeding Bathing Assistance: Limited assistance Feeding assistance: Limited assistance Dressing Assistance: Limited assistance     Functional Limitations Info  Sight, Hearing, Speech Sight Info: Impaired Hearing Info: Adequate Speech Info: Adequate    SPECIAL CARE FACTORS FREQUENCY  PT (By licensed PT), OT (By licensed OT)     PT Frequency: 5x/wk OT Frequency: 5x/wk            Contractures Contractures Info: Not present    Additional Factors Info  Code Status, Allergies Code Status Info: full code Allergies Info: No Known Allergies           Current Medications (11/12/2023):  This is the current hospital active medication list Current Facility-Administered Medications  Medication Dose Route Frequency Provider Last Rate Last Admin   acetaminophen  (TYLENOL ) tablet 650 mg  650 mg Oral Q6H PRN Waddell Rake, MD       Or   acetaminophen  (TYLENOL ) suppository 650 mg  650 mg Rectal Q6H PRN Waddell Rake, MD       atenolol  (TENORMIN ) tablet 50 mg  50 mg Oral BID Waddell Rake, MD   50 mg at 11/12/23 9060   gabapentin  (NEURONTIN ) capsule 300 mg  300 mg Oral QHS Waddell Rake, MD   300 mg at 11/11/23 2133   HYDROcodone -acetaminophen  (NORCO/VICODIN) 5-325 MG per tablet 1-2 tablet  1-2 tablet Oral Q6H PRN Waddell,  Isaiah, MD   2 tablet at 11/11/23 2132   insulin  aspart (novoLOG ) injection 0-15 Units  0-15 Units Subcutaneous TID WC Franklyn Sid SAILOR, MD   3 Units at 11/12/23 1238   insulin  aspart (novoLOG ) injection 5 Units  5 Units Subcutaneous TID WC Cheryle Page, MD       NOREEN ON 11/13/2023] insulin  glargine-yfgn (SEMGLEE ) injection 15 Units  15 Units Subcutaneous Daily Alekh, Kshitiz, MD       ketorolac  (TORADOL ) 15 MG/ML injection 15 mg  15 mg Intravenous Q6H PRN Cheryle, Kshitiz, MD   15 mg at 11/12/23 1553    levothyroxine  (SYNTHROID ) tablet 50 mcg  50 mcg Oral Q0600 Waddell Isaiah, MD   50 mcg at 11/12/23 0556   morphine  (PF) 2 MG/ML injection 2 mg  2 mg Intravenous Q3H PRN Waddell Isaiah, MD       ondansetron  (ZOFRAN ) tablet 4 mg  4 mg Oral Q6H PRN Waddell Isaiah, MD       Or   ondansetron  (ZOFRAN ) injection 4 mg  4 mg Intravenous Q6H PRN Waddell Isaiah, MD       PARoxetine  (PAXIL ) tablet 40 mg  40 mg Oral Daily Waddell Isaiah, MD   40 mg at 11/12/23 9060   rosuvastatin  (CRESTOR ) tablet 20 mg  20 mg Oral Daily Waddell Isaiah, MD   20 mg at 11/12/23 9060   sodium chloride  flush (NS) 0.9 % injection 3 mL  3 mL Intravenous Q12H Waddell Isaiah, MD   3 mL at 11/12/23 0940     Discharge Medications: Please see discharge summary for a list of discharge medications.  Relevant Imaging Results:  Relevant Lab Results:   Additional Information SSN [id number]  Sheri ONEIDA Sharps, LCSW

## 2023-11-12 NOTE — Plan of Care (Signed)

## 2023-11-13 DIAGNOSIS — R55 Syncope and collapse: Secondary | ICD-10-CM | POA: Diagnosis not present

## 2023-11-13 LAB — BASIC METABOLIC PANEL WITH GFR
Anion gap: 7 (ref 5–15)
BUN: 39 mg/dL — ABNORMAL HIGH (ref 8–23)
CO2: 27 mmol/L (ref 22–32)
Calcium: 8.4 mg/dL — ABNORMAL LOW (ref 8.9–10.3)
Chloride: 100 mmol/L (ref 98–111)
Creatinine, Ser: 1.86 mg/dL — ABNORMAL HIGH (ref 0.44–1.00)
GFR, Estimated: 29 mL/min — ABNORMAL LOW (ref >60)
Glucose, Bld: 275 mg/dL — ABNORMAL HIGH (ref 70–99)
Potassium: 3.9 mmol/L (ref 3.5–5.1)
Sodium: 134 mmol/L — ABNORMAL LOW (ref 135–145)

## 2023-11-13 LAB — GLUCOSE, CAPILLARY
Glucose-Capillary: 235 mg/dL — ABNORMAL HIGH (ref 70–99)
Glucose-Capillary: 322 mg/dL — ABNORMAL HIGH (ref 70–99)
Glucose-Capillary: 191 mg/dL — ABNORMAL HIGH (ref 70–99)
Glucose-Capillary: 112 mg/dL — ABNORMAL HIGH (ref 70–99)
Glucose-Capillary: 157 mg/dL — ABNORMAL HIGH (ref 70–99)

## 2023-11-13 LAB — CBC WITH DIFFERENTIAL/PLATELET
Abs Immature Granulocytes: 0.04 K/uL (ref 0.00–0.07)
Basophils Absolute: 0.1 K/uL (ref 0.0–0.1)
Basophils Relative: 0 %
Eosinophils Absolute: 0.4 K/uL (ref 0.0–0.5)
Eosinophils Relative: 3 %
HCT: 38.8 % (ref 36.0–46.0)
Hemoglobin: 12.6 g/dL (ref 12.0–15.0)
Immature Granulocytes: 0 %
Lymphocytes Relative: 23 %
Lymphs Abs: 3 K/uL (ref 0.7–4.0)
MCH: 30.2 pg (ref 26.0–34.0)
MCHC: 32.5 g/dL (ref 30.0–36.0)
MCV: 93 fL (ref 80.0–100.0)
Monocytes Absolute: 1.1 K/uL — ABNORMAL HIGH (ref 0.1–1.0)
Monocytes Relative: 9 %
Neutro Abs: 8.4 K/uL — ABNORMAL HIGH (ref 1.7–7.7)
Neutrophils Relative %: 65 %
Platelets: 246 K/uL (ref 150–400)
RBC: 4.17 MIL/uL (ref 3.87–5.11)
RDW: 12.6 % (ref 11.5–15.5)
WBC: 13 K/uL — ABNORMAL HIGH (ref 4.0–10.5)
nRBC: 0 % (ref 0.0–0.2)

## 2023-11-13 LAB — MAGNESIUM: Magnesium: 2 mg/dL (ref 1.7–2.4)

## 2023-11-13 MED ORDER — SODIUM CHLORIDE 0.9 % IV BOLUS
1000.0000 mL | Freq: Once | INTRAVENOUS | Status: AC
  Administered 2023-11-13: 1000 mL via INTRAVENOUS

## 2023-11-13 MED ORDER — SODIUM CHLORIDE 0.9 % IV SOLN: INTRAVENOUS | Status: DC

## 2023-11-13 MED ORDER — MIDODRINE HCL 5 MG PO TABS
10.0000 mg | ORAL_TABLET | Freq: Once | ORAL | Status: AC
  Administered 2023-11-13: 10 mg via ORAL
  Filled 2023-11-13: qty 2

## 2023-11-13 NOTE — Plan of Care (Signed)

## 2023-11-13 NOTE — Plan of Care (Signed)
  Problem: Cardiac: Goal: Will achieve and/or maintain adequate cardiac output Outcome: Progressing   Problem: Education: Goal: Knowledge of General Education information will improve Description: Including pain rating scale, medication(s)/side effects and non-pharmacologic comfort measures Outcome: Progressing   Problem: Health Behavior/Discharge Planning: Goal: Ability to manage health-related needs will improve Outcome: Progressing   Problem: Clinical Measurements: Goal: Will remain free from infection Outcome: Progressing Goal: Respiratory complications will improve Outcome: Progressing   Problem: Activity: Goal: Risk for activity intolerance will decrease Outcome: Progressing   Problem: Nutrition: Goal: Adequate nutrition will be maintained Outcome: Progressing   Problem: Elimination: Goal: Will not experience complications related to bowel motility Outcome: Progressing   Problem: Pain Managment: Goal: General experience of comfort will improve and/or be controlled Outcome: Progressing   Problem: Skin Integrity: Goal: Risk for impaired skin integrity will decrease Outcome: Progressing

## 2023-11-13 NOTE — Progress Notes (Signed)
   11/13/23 1623  PT Visit Information  Last PT Received On 11/13/23  Reason Eval/Treat Not Completed Medical issues which prohibited therapy  History of Present Illness Pt is 73 yo female presented on 11/11/23 after syncope and collapse - treated with IV fluids.  Pt found to have R proximal humerus fx with orthopedic consult -surgery likely indicated. Pt with hx including but not limited to T2DM, HLD, HTN, CKD stage 3b, hypothyroidism, depression.  Noted earlier hospital admission for orthostatic hypotension and pt also reports hx of vertigo.   Pt has had low blood pressure today, discussed with nursing to hold today and will follow up per plan of care.   Stann, PT Acute Rehabilitation Services Office: [phone] 11/13/2023

## 2023-11-13 NOTE — TOC Progression Note (Signed)
 Transition of Care [facility]) - Progression Note    Patient Details  Name: Jennifer Ingram MRN: [id number] Date of Birth: 1950-09-01  Transition of Care [facility]) CM/SW Contact  Sheri ONEIDA Sharps, [place] Phone Number: 11/13/2023, 4:13 PM  Clinical Narrative:    Bed choice [facility]. Ins Aurh started; pending approval.   Expected Discharge Plan: Skilled Nursing Facility Barriers to Discharge: Continued Medical Work up  Expected Discharge Plan and Services In-house Referral: NA Discharge Planning Services: NA Post Acute Care Choice: Skilled Nursing Facility Living arrangements for the past 2 months: Mobile Home                 DME Arranged: N/A DME Agency: NA       HH Arranged: NA HH Agency: NA         Social Determinants of Health (SDOH) Interventions SDOH Screenings   Food Insecurity: No Food Insecurity (11/11/2023)  Housing: Low Risk  (11/11/2023)  Transportation Needs: No Transportation Needs (11/11/2023)  Utilities: Not At Risk (11/11/2023)  Alcohol Screen: Low Risk  (10/10/2022)  Depression (PHQ2-9): Low Risk  (10/10/2022)  Physical Activity: Sufficiently Active (10/10/2022)  Social Connections: Moderately Integrated (11/11/2023)  Stress: No Stress Concern Present (10/10/2022)  Tobacco Use: Low Risk  (11/11/2023)    Readmission Risk Interventions    11/12/2023    4:30 PM 07/21/2023    9:11 AM 01/06/2023    1:24 PM  Readmission Risk Prevention Plan  Post Dischage Appt Complete    Medication Screening Complete    Transportation Screening Complete Complete Complete  PCP or Specialist Appt within 5-7 Days  Complete Complete  Home Care Screening  Complete Complete  Medication Review (RN CM)  Complete Complete

## 2023-11-13 NOTE — Progress Notes (Addendum)
 PROGRESS NOTE    Jennifer Ingram  FMW:[id number] DOB: [birth year] DOA: 11/11/2023 PCP: Patient, No Pcp Per   Brief Narrative:   73 y.o. female with medical history significant of T2DM, HLD, HTN, CKD stage 3b, hypothyroidism, depression presented with syncope and right shoulder pain after a fall.  On presentation, WBC was 13.7, CBG of 554.  CT head and cervical spine showed no acute findings.  CT of chest/abdomen/pelvis showed near 2 part surgical neck fracture of the right proximal humerus displaced with moderate sized hiatal hernia and degenerative facet arthropathy bilaterally at L4-5 with grade 1 degenerative anterolisthesis of L4 on L5 with moderate right foraminal stenosis at L4-5 due to disc bulge along with aortic atherosclerosis.  Pelvis x-ray showed no acute findings.  Orthopedics was consulted.  Assessment & Plan:   Syncope and collapse -Continue telemetry monitoring.  Fall precautions.  No syncope since admission.  No acute ischemic changes on EKG; troponin normal.  Echo showed EF of 60 to 65% Vitamin B12 and TSH normal.  CK total normal. - Treated with some IV fluids. - Fall precautions.  PT recommending SNF placement: TOC consulted - MRI of brain negative for acute stroke.  2 part displaced fracture of surgical neck of right humerus - Orthopedics recommending outpatient follow-up with Dr. Sharl for possible plan for surgical intervention next week.  Continue right upper extremity sling.  Continue pain management.  Ice as needed.  Diabetes mellitus type 2 with hyperglycemia - A1c more than 15.5 in 06/2023.  Repeat a.m. A1c.  Diabetes coordinator following.  Continue long-acting and short acting insulin .  Continue CBGs with SSI.  Carb modified diet  Leukocytosis -Possibly reactive.  Monitor  AKI on CKD stage IIIb - Creatinine has worsened to 1.86 today.  Questionable cause.  Start IV fluids.  Repeat a.m. labs baseline creatinine of  1.1-1.2  Hypertension Hyperlipidemia -Continue statin.  Blood pressure on the lower side.  Hold atenolol .  IV fluids as above.  Hypothyroidism -Continue levothyroxine   Anxiety and depression - Continue paroxetine   DVT prophylaxis: SCDs Code Status: Full Family Communication: None at bedside Disposition Plan: Status is: Inpatient Remains inpatient appropriate because: Of severity of illness.  Need for SNF placement.  Need for IV fluids    Consultants: Orthopedics  Procedures: None  Antimicrobials: None   Subjective: Patient seen and examined at bedside.  Continues to have right shoulder pain.  No fever, chest pain or shortness breath reported.   Objective: Vitals:   11/12/23 1519 11/12/23 2144 11/13/23 0500 11/13/23 0517  BP: 92/63 (!) 85/52  112/62  Pulse: 79 74  82  Resp: 20 18  20   Temp: 99.3 F (37.4 C) 98.5 F (36.9 C)  99.1 F (37.3 C)  TempSrc: Oral     SpO2: 94% 92%  92%  Weight:   66.9 kg   Height:       No intake or output data in the 24 hours ending 11/13/23 0812  Filed Weights   11/12/23 0408 11/12/23 0500 11/13/23 0500  Weight: 66 kg 66.7 kg 66.9 kg    Examination:  General: On room air.  No distress.  Looks chronically ill and deconditioned. ENT/neck: No thyromegaly.  JVD is not elevated  respiratory: Decreased breath sounds at bases bilaterally with some crackles; no wheezing  CVS: S1-S2 heard, rate controlled currently Abdominal: Soft, nontender, slightly distended; no organomegaly, bowel sounds are heard Extremities: Trace lower extremity edema; no cyanosis  CNS: Awake and alert.  Slow to respond.  Poor historian.  No focal neurologic deficit.  Moves extremities Lymph: No obvious lymphadenopathy Skin: No obvious ecchymosis/lesions  psych: Mostly flat affect.  Not agitated currently. musculoskeletal: Right shoulder is in a sling     Data Reviewed: I have personally reviewed following labs and imaging studies  CBC: Recent Labs   Lab 11/11/23 1428 11/12/23 0513 11/13/23 0528  WBC 13.7* 13.6* 13.0*  NEUTROABS 11.0*  --  8.4*  HGB 14.8 13.2 12.6  HCT 43.9 38.8 38.8  MCV 89.0 90.2 93.0  PLT 234 265 246   Basic Metabolic Panel: Recent Labs  Lab 11/11/23 1428 11/11/23 1902 11/12/23 0513 11/13/23 0528  NA 133* 136 136 134*  K 4.0 3.9 4.2 3.9  CL 98 103 99 100  CO2 22 23 28 27   GLUCOSE 540* 385* 344* 275*  BUN 23 23 25* 39*  CREATININE 1.06* 0.98 1.03* 1.86*  CALCIUM  8.3* 8.6* 8.6* 8.4*  MG  --   --   --  2.0   GFR: Estimated Creatinine Clearance: 23.7 mL/min (A) (by C-G formula based on SCr of 1.86 mg/dL (H)). Liver Function Tests: Recent Labs  Lab 11/11/23 1428  AST 17  ALT 10  ALKPHOS 75  BILITOT 0.8  PROT 6.6  ALBUMIN 3.5   No results for input(s): LIPASE, AMYLASE in the last 168 hours. No results for input(s): AMMONIA in the last 168 hours. Coagulation Profile: Recent Labs  Lab 11/11/23 1428  INR 1.0   Cardiac Enzymes: Recent Labs  Lab 11/11/23 1428  CKTOTAL 48   BNP (last 3 results) No results for input(s): PROBNP in the last 8760 hours. HbA1C: No results for input(s): HGBA1C in the last 72 hours. CBG: Recent Labs  Lab 11/12/23 1214 11/12/23 1729 11/12/23 2123 11/13/23 0512 11/13/23 0732  GLUCAP 161* 226* 154* 235* 322*   Lipid Profile: No results for input(s): CHOL, HDL, LDLCALC, TRIG, CHOLHDL, LDLDIRECT in the last 72 hours. Thyroid  Function Tests: Recent Labs    11/11/23 1902  TSH 2.076   Anemia Panel: Recent Labs    11/11/23 1903  VITAMINB12 394   Sepsis Labs: No results for input(s): PROCALCITON, LATICACIDVEN in the last 168 hours.  No results found for this or any previous visit (from the past 240 hours).       Radiology Studies: ECHOCARDIOGRAM COMPLETE Result Date: 11/12/2023    ECHOCARDIOGRAM REPORT   Patient Name:   Jennifer Ingram Date of Exam: 11/12/2023 Medical Rec #:  [id number]      Height:       60.0 in  Accession #:    [id number]     Weight:       147.0 lb Date of Birth:  [birth year]      BSA:          1.638 m Patient Age:    73 years       BP:           114/67 mmHg Patient Gender: F              HR:           79 bpm. Exam Location:  Inpatient Procedure: 2D Echo, Cardiac Doppler, Color Doppler and Intracardiac            Opacification Agent (Both Spectral and Color Flow Doppler were            utilized during procedure). Indications:    Syncope  History:        Patient has no  prior history of Echocardiogram examinations.                 Risk Factors:Hypertension, Diabetes and Dyslipidemia.  Sonographer:    Therisa Crouch Referring Phys: [id number] ALLISON WOLFE IMPRESSIONS  1. Left ventricular ejection fraction, by estimation, is 60 to 65%. The left ventricle has normal function. The left ventricle has no regional wall motion abnormalities. There is mild left ventricular hypertrophy. Left ventricular diastolic parameters were normal.  2. Right ventricular systolic function is mildly reduced. The right ventricular size is normal. Mildly increased right ventricular wall thickness. Tricuspid regurgitation signal is inadequate for assessing PA pressure.  3. The mitral valve is degenerative. No evidence of mitral valve regurgitation. No evidence of mitral stenosis.  4. The aortic valve is tricuspid. Aortic valve regurgitation is mild. Paradoxical low flow, low gradient atleast moderate AS (peak velocity 2.66m/s, MG , AVA (VTI) 1.07cm2, planimetry 0.89cm2, DI 0.42, SVi 26).  5. The inferior vena cava is normal in size with <50% respiratory variability, suggesting right atrial pressure of 8 mmHg. Comparison(s): No prior Echocardiogram. Conclusion(s)/Recommendation(s): No left ventricular mural or apical thrombus/thrombi. Either consider aortic valve calcium  score or invasive hemodynamics to further clarify the severity of aortic stenosis. FINDINGS  Left Ventricle: Left ventricular ejection fraction, by estimation, is 60 to  65%. The left ventricle has normal function. The left ventricle has no regional wall motion abnormalities. The left ventricular internal cavity size was small. There is mild left ventricular hypertrophy. Left ventricular diastolic parameters were normal. Right Ventricle: The right ventricular size is normal. Mildly increased right ventricular wall thickness. Right ventricular systolic function is mildly reduced. Tricuspid regurgitation signal is inadequate for assessing PA pressure. Left Atrium: Left atrial size was normal in size. Right Atrium: Right atrial size was normal in size. Pericardium: There is no evidence of pericardial effusion. Mitral Valve: The mitral valve is degenerative in appearance. There is mild thickening of the mitral valve leaflet(s). Normal mobility of the mitral valve leaflets. Mild mitral annular calcification. No evidence of mitral valve regurgitation. No evidence  of mitral valve stenosis. Tricuspid Valve: The tricuspid valve is grossly normal. Tricuspid valve regurgitation is not demonstrated. No evidence of tricuspid stenosis. Aortic Valve: The aortic valve is tricuspid. Aortic valve regurgitation is mild. Paradoxical low flow, low gradient atleast moderate AS (peak velocity 2.63m/s, MG , AVA (VTI) 1.07cm2, planimetry 0.89cm2, DI 0.42, SVi 26). Aortic valve mean gradient measures 10.5 mmHg. Aortic valve peak gradient measures 19.4 mmHg. Aortic valve area, by VTI measures 1.07 cm. Pulmonic Valve: The pulmonic valve was normal in structure. Pulmonic valve regurgitation is mild. No evidence of pulmonic stenosis. Aorta: The aortic root and ascending aorta are structurally normal, with no evidence of dilitation. Venous: The inferior vena cava is normal in size with less than 50% respiratory variability, suggesting right atrial pressure of 8 mmHg. IAS/Shunts: The atrial septum is grossly normal.  LEFT VENTRICLE PLAX 2D LVIDd:         3.20 cm     Diastology LVIDs:         2.00 cm     LV  e' medial:    6.31 cm/s LV PW:         1.20 cm     LV E/e' medial:  10.2 LV IVS:        1.10 cm     LV e' lateral:   5.22 cm/s LVOT diam:     1.80 cm     LV E/e' lateral: 12.3  LV SV:         42 LV SV Index:   26 LVOT Area:     2.54 cm  LV Volumes (MOD) LV vol d, MOD A2C: 51.4 ml LV vol d, MOD A4C: 51.3 ml LV vol s, MOD A2C: 18.8 ml LV vol s, MOD A4C: 19.3 ml LV SV MOD A2C:     32.6 ml LV SV MOD A4C:     51.3 ml LV SV MOD BP:      33.0 ml RIGHT VENTRICLE            IVC RV S prime:     8.49 cm/s  IVC diam: 1.90 cm TAPSE (M-mode): 1.6 cm LEFT ATRIUM             Index LA diam:        2.90 cm 1.77 cm/m LA Vol (A2C):   34.6 ml 21.12 ml/m LA Vol (A4C):   38.3 ml 23.38 ml/m LA Biplane Vol: 38.7 ml 23.63 ml/m  AORTIC VALVE AV Area (Vmax):    1.01 cm AV Area (Vmean):   0.99 cm AV Area (VTI):     1.07 cm AV Vmax:           220.00 cm/s AV Vmean:          152.500 cm/s AV VTI:            0.398 m AV Peak Grad:      19.4 mmHg AV Mean Grad:      10.5 mmHg LVOT Vmax:         87.00 cm/s LVOT Vmean:        59.600 cm/s LVOT VTI:          0.167 m LVOT/AV VTI ratio: 0.42  AORTA Ao Root diam: 3.20 cm Ao Asc diam:  3.20 cm MITRAL VALVE MV Area (PHT): 3.07 cm    SHUNTS MV Decel Time: 247 msec    Systemic VTI:  0.17 m MV E velocity: 64.30 cm/s  Systemic Diam: 1.80 cm MV A velocity: 84.00 cm/s MV E/A ratio:  0.77 Sunit Tolia Electronically signed by Madonna Large Signature Date/Time: 11/12/2023/5:02:42 PM    Final    MR BRAIN WO CONTRAST Result Date: 11/12/2023 EXAM: MRI BRAIN WITHOUT CONTRAST 11/12/2023 08:33:09 AM TECHNIQUE: Multiplanar multisequence MRI of the head/brain was performed without the administration of intravenous contrast. COMPARISON: None available. CLINICAL HISTORY: Head trauma, abnormal mental status (Age 63-64y). MR BRAIN WO; altered mental status FINDINGS: BRAIN AND VENTRICLES: No acute infarct. No intracranial hemorrhage. No mass. No midline shift. No hydrocephalus. The sella is unremarkable. Normal flow voids.  Mild atrophy and right matter changes are within normal limits for age. ORBITS: Bilateral lens replacements are noted. The globes and orbits are otherwise within normal limits. SINUSES AND MASTOIDS: No acute abnormality. BONES AND SOFT TISSUES: Normal marrow signal. No acute soft tissue abnormality. IMPRESSION: 1. No acute intracranial abnormality related to head trauma or altered mental status. Normal MRI of the brain for age. Electronically signed by: Lonni Necessary MD 11/12/2023 08:47 AM EDT RP Workstation: [id number]   CT CHEST ABDOMEN PELVIS W CONTRAST Result Date: 11/11/2023 CLINICAL DATA:  Syncope, right shoulder deformity. Earlier imaging today revealed a surgical neck fracture the right proximal humerus. EXAM: CT CHEST, ABDOMEN, AND PELVIS WITH CONTRAST TECHNIQUE: Multidetector CT imaging of the chest, abdomen and pelvis was performed following the standard protocol during bolus administration of intravenous contrast. RADIATION DOSE REDUCTION: This exam was performed according to the departmental  dose-optimization program which includes automated exposure control, adjustment of the mA and/or kV according to patient size and/or use of iterative reconstruction technique. CONTRAST:  80mL OMNIPAQUE  IOHEXOL  300 MG/ML  SOLN COMPARISON:  Radiographs from 11/11/2023 and CT abdomen from 07/21/2023 FINDINGS: CT CHEST FINDINGS Cardiovascular: Atheromatous vascular calcification the aortic arch. Mild aortic valve calcification. Mediastinum/Nodes: Moderate-sized hiatal hernia. Lungs/Pleura: Biapical pleuroparenchymal scarring. Dependent subsegmental atelectasis in both lower lobes. Mild scarring in the vicinity of the right minor fissure. Musculoskeletal: Mild lower thoracic spondylosis. Neer 2 part surgical neck fracture of the right proximal humerus displaced 1.7 cm CT ABDOMEN PELVIS FINDINGS Hepatobiliary: Cholecystectomy. Stable mild prominence of the common bile duct at 1.1 cm, likely a physiologic response  to cholecystectomy. Pancreas: Unremarkable Spleen: Unremarkable Adrenals/Urinary Tract: Unremarkable Stomach/Bowel: Unremarkable Vascular/Lymphatic: Atherosclerosis is present, including aortoiliac atherosclerotic disease. Reproductive: Unremarkable Other: No supplemental non-categorized findings. Musculoskeletal: Degenerative facet arthropathy bilaterally at L4-5 with grade 1 degenerative anterolisthesis of L4 on L5. Moderate right foraminal stenosis at L4-5 due to disc bulge, facet arthropathy, and disc uncovering. IMPRESSION: 1. Neer 2-part surgical neck fracture of the right proximal humerus displaced 1.7 cm. 2. No additional acute findings in the chest, abdomen, or pelvis. 3. Moderate-sized hiatal hernia. 4. Degenerative facet arthropathy bilaterally at L4-5 with grade 1 degenerative anterolisthesis of L4 on L5. Moderate right foraminal stenosis at L4-5 due to disc bulge, facet arthropathy, and disc uncovering. 5.  Aortic Atherosclerosis (ICD10-I70.0). Electronically Signed   By: Ryan Salvage M.D.   On: 11/11/2023 17:18   CT Head Wo Contrast Result Date: 11/11/2023 EXAM: CT HEAD AND CERVICAL SPINE 11/11/2023 05:01:25 PM TECHNIQUE: CT of the head and cervical spine was performed without the administration of intravenous contrast. Multiplanar reformatted images are provided for review. Automated exposure control, iterative reconstruction, and/or weight based adjustment of the mA/kV was utilized to reduce the radiation dose to as low as reasonably achievable. COMPARISON: CT head 01/04/2023 CLINICAL HISTORY: Head trauma, minor (Age >= 65y). Pt bib gcems for syncopal episode this morning. Son called 911. Patietn doesn't remember falling or having syncopal episode. Obvious deformity to right shoulder. FINDINGS: CT HEAD BRAIN AND VENTRICLES: No acute intracranial hemorrhage. No mass effect or midline shift. No abnormal extra-axial fluid collection. Gray-white differentiation is maintained. No hydrocephalus.  ORBITS: No acute abnormality. SINUSES AND MASTOIDS: No acute abnormality. SOFT TISSUES AND SKULL: No acute skull fracture. No acute soft tissue abnormality. CT CERVICAL SPINE BONES AND ALIGNMENT: No acute fracture or traumatic malalignment. 3 mm degenerative anterolisthesis of C4 on C5. DEGENERATIVE CHANGES: Degenerative endplate changes at C5-6. Left greater than right facet arthropathy at C4-5. SOFT TISSUES: No prevertebral soft tissue swelling. VASCULATURE: Atherosclerotic calcifications of the carotid bulbs. IMPRESSION: 1. No acute intracranial abnormality. 2. No acute fracture or traumatic malalignment of the cervical spine. Electronically signed by: Ryan Chess MD 11/11/2023 05:14 PM EDT RP Workstation: [id number]   CT Cervical Spine Wo Contrast Result Date: 11/11/2023 EXAM: CT HEAD AND CERVICAL SPINE 11/11/2023 05:01:25 PM TECHNIQUE: CT of the head and cervical spine was performed without the administration of intravenous contrast. Multiplanar reformatted images are provided for review. Automated exposure control, iterative reconstruction, and/or weight based adjustment of the mA/kV was utilized to reduce the radiation dose to as low as reasonably achievable. COMPARISON: CT head 01/04/2023 CLINICAL HISTORY: Head trauma, minor (Age >= 67y). Pt bib gcems for syncopal episode this morning. Son called 911. Patietn doesn't remember falling or having syncopal episode. Obvious deformity to right shoulder. FINDINGS: CT HEAD BRAIN AND  VENTRICLES: No acute intracranial hemorrhage. No mass effect or midline shift. No abnormal extra-axial fluid collection. Gray-white differentiation is maintained. No hydrocephalus. ORBITS: No acute abnormality. SINUSES AND MASTOIDS: No acute abnormality. SOFT TISSUES AND SKULL: No acute skull fracture. No acute soft tissue abnormality. CT CERVICAL SPINE BONES AND ALIGNMENT: No acute fracture or traumatic malalignment. 3 mm degenerative anterolisthesis of C4 on C5. DEGENERATIVE  CHANGES: Degenerative endplate changes at C5-6. Left greater than right facet arthropathy at C4-5. SOFT TISSUES: No prevertebral soft tissue swelling. VASCULATURE: Atherosclerotic calcifications of the carotid bulbs. IMPRESSION: 1. No acute intracranial abnormality. 2. No acute fracture or traumatic malalignment of the cervical spine. Electronically signed by: Ryan Chess MD 11/11/2023 05:14 PM EDT RP Workstation: [id number]   DG Chest Portable 1 View Result Date: 11/11/2023 CLINICAL DATA:  Right proximal humeral fracture, fall EXAM: PORTABLE CHEST 1 VIEW COMPARISON:  12/28/2022 FINDINGS: The displaced right proximal humeral surgical neck fracture is observed. The lungs appear clear. Cardiac and mediastinal contours normal. No blunting of the costophrenic angles. Mild lower thoracic spondylosis. IMPRESSION: 1. Displaced right proximal humeral surgical neck fracture. 2. No acute cardiopulmonary findings. 3. Mild lower thoracic spondylosis. Electronically Signed   By: Ryan Salvage M.D.   On: 11/11/2023 15:28   DG Pelvis Portable Result Date: 11/11/2023 CLINICAL DATA:  Fall, right proximal humeral fracture EXAM: PORTABLE PELVIS 1-2 VIEWS COMPARISON:  CT pelvis 07/21/2023 FINDINGS: Chronic degenerative disc disease at L4-5. No pelvic fracture or acute bony finding identified. IMPRESSION: 1. No pelvic fracture or acute bony finding identified. 2. Chronic degenerative disc disease at L4-5. Electronically Signed   By: Ryan Salvage M.D.   On: 11/11/2023 15:26   DG Elbow 2 Views Right Result Date: 11/11/2023 CLINICAL DATA:  Deformity, fall EXAM: RIGHT ELBOW - 2 VIEW COMPARISON:  None Available. FINDINGS: Displaced surgical neck fracture the right proximal humerus. The patient has very limited ability to move the elbow due to the proximal humeral fracture. Both views are very limited and oblique. No obvious/gross elbow fracture observed. Indeterminate for elbow effusion. IMPRESSION: 1. Displaced surgical  neck fracture of the right proximal humerus. 2. Very limited ability to move the elbow due to the proximal humeral fracture. No obvious/gross elbow fracture observed. Indeterminate for elbow effusion, adversely affected negative predictive value due to positioning. Electronically Signed   By: Ryan Salvage M.D.   On: 11/11/2023 15:25   DG Humerus Right Result Date: 11/11/2023 CLINICAL DATA:  Fall, shoulder deformity EXAM: RIGHT HUMERUS - 2+ VIEW COMPARISON:  None Available. FINDINGS: Displaced acute surgical neck fracture the right proximal humerus. No other fracture is identified. IMPRESSION: 1. Displaced acute surgical neck fracture of the right proximal humerus. Electronically Signed   By: Ryan Salvage M.D.   On: 11/11/2023 15:24   DG Shoulder Right Portable Result Date: 11/11/2023 CLINICAL DATA:  Fall, shoulder deformity EXAM: RIGHT SHOULDER - 1 VIEW COMPARISON:  None Available. FINDINGS: Surgical neck fracture proximal humerus displaced up to 2.5 cm. No other fracture is observed. IMPRESSION: 1. Surgical neck fracture right proximal humerus displaced up to 2.5 cm. Electronically Signed   By: Ryan Salvage M.D.   On: 11/11/2023 15:23        Scheduled Meds:  atenolol   50 mg Oral BID   gabapentin   300 mg Oral QHS   insulin  aspart  0-15 Units Subcutaneous TID WC   insulin  aspart  5 Units Subcutaneous TID WC   insulin  glargine-yfgn  15 Units Subcutaneous Daily   levothyroxine   50  mcg Oral Q0600   PARoxetine   40 mg Oral Daily   rosuvastatin   20 mg Oral Daily   sodium chloride  flush  3 mL Intravenous Q12H   Continuous Infusions:        Sophie Mao, MD [facility] 11/13/2023, 8:12 AM

## 2023-11-13 NOTE — Consult Note (Signed)
 Reason for Consult: right proximal humerus fracture Referring Physician: Cheryle, MD (Hospitalist)  Jennifer Ingram is an 73 y.o. female.  HPI: 73 y.o. female with medical history significant of T2DM, HLD, HTN, CKD stage 3b, hypothyroidism, depression presented with syncope and right shoulder pain after a fall.  On presentation, WBC was 13.7, CBG of 554.  CT head and cervical spine showed no acute findings.  CT of chest/abdomen/pelvis showed near 2 part surgical neck fracture of the right proximal humerus displaced with moderate sized hiatal hernia and degenerative facet arthropathy bilaterally at L4-5 with grade 1 degenerative anterolisthesis of L4 on L5 with moderate right foraminal stenosis at L4-5 due to disc bulge along with aortic atherosclerosis.  Pelvis x-ray showed no acute findings.  Orthopedics was consulted.   No other complaints of extremity pain at this point.  Past Medical History:  Diagnosis Date   Arthritis    bilateral knees and back;   Depression    on meds, working well   Diabetes mellitus without complication (HCC) 05/31/2012   on meds   Heart palpitations    rapid   Hyperlipidemia    on meds   Hypertension    on meds    Past Surgical History:  Procedure Laterality Date   APPENDECTOMY  1968   CESAREAN SECTION  1980   CHOLECYSTECTOMY  1985   TONSILLECTOMY  1960    Family History  Problem Relation Age of Onset   Cancer Mother        leukemia   Cancer Father        lung   Breast cancer Neg Hx    Colon polyps Neg Hx    Crohn's disease Neg Hx    Esophageal cancer Neg Hx    Stomach cancer Neg Hx    Rectal cancer Neg Hx     Social History:  reports that she has never smoked. She has never used smokeless tobacco. She reports that she does not drink alcohol and does not use drugs.  Allergies: No Known Allergies  Medications: I have reviewed the patient's current medications. Scheduled:  atenolol   50 mg Oral BID   gabapentin   300 mg Oral QHS   insulin   aspart  0-15 Units Subcutaneous TID WC   insulin  aspart  5 Units Subcutaneous TID WC   insulin  glargine-yfgn  15 Units Subcutaneous Daily   levothyroxine   50 mcg Oral Q0600   PARoxetine   40 mg Oral Daily   rosuvastatin   20 mg Oral Daily   sodium chloride  flush  3 mL Intravenous Q12H    Results for orders placed or performed during the hospital encounter of 11/11/23 (from the past 24 hours)  Glucose, capillary     Status: Abnormal   Collection Time: 11/12/23  7:44 AM  Result Value Ref Range   Glucose-Capillary 355 (H) 70 - 99 mg/dL   Comment 1 Notify RN   Glucose, capillary     Status: Abnormal   Collection Time: 11/12/23 12:14 PM  Result Value Ref Range   Glucose-Capillary 161 (H) 70 - 99 mg/dL  Glucose, capillary     Status: Abnormal   Collection Time: 11/12/23  5:29 PM  Result Value Ref Range   Glucose-Capillary 226 (H) 70 - 99 mg/dL  Glucose, capillary     Status: Abnormal   Collection Time: 11/12/23  9:23 PM  Result Value Ref Range   Glucose-Capillary 154 (H) 70 - 99 mg/dL  Glucose, capillary     Status: Abnormal  Collection Time: 11/13/23  5:12 AM  Result Value Ref Range   Glucose-Capillary 235 (H) 70 - 99 mg/dL     X-ray: CLINICAL DATA:  Fall, shoulder deformity   EXAM: RIGHT SHOULDER - 1 VIEW   COMPARISON:  None Available.   FINDINGS: Surgical neck fracture proximal humerus displaced up to 2.5 cm.   No other fracture is observed.   IMPRESSION: 1. Surgical neck fracture right proximal humerus displaced up to 2.5 cm.     Electronically Signed   By: Ryan Salvage M.D.  ROS: AS per HPI  Blood pressure 112/62, pulse 82, temperature 99.1 F (37.3 C), resp. rate 20, height 5' (1.524 m), weight 66.7 kg, SpO2 92%.  Physical Exam: General:  Appears calm and comfortable and is in NAD Eyes:  PERRL, EOMI, normal lids, iris ENT:  grossly normal hearing, lips & tongue, mmm; appropriate dentition Neck:  no LAD, masses or thyromegaly; no carotid  bruits Cardiovascular:  RRR, +systolic murmur. No LE edema.  Respiratory:   CTA bilaterally with no wheezes/rales/rhonchi.  Normal respiratory effort. Abdomen:  soft, mild TTP generalized, ND, NABS. No rebound or guarding.  Back:   normal alignment, no CVAT Skin:  no rash or induration seen on limited exam Musculoskeletal:  grossly normal tone LUE/BLE, good ROM,RUE: shoulder in sling with diffuse edema and bruising to proximal humerus  Lower extremity:  No LE edema.  Limited foot exam with no ulcerations.  2+ distal pulses. Psychiatric:  grossly normal mood and affect, speech fluent and appropriate, AOx2 Neurologic:  CN 2-12 grossly intact, moves all extremities in coordinated fashion, sensation intac  Assessment/Plan: Right proximal humerus fracture  Plan: She will need operative fixation for this fracture There are no available surgeons to address this during this hospital stay Plan for follow up in our office this week likely with Dr. Selinda Gosling to get scheduled for ORIF of shoulder likely next week Maintain RUE in sling for comfort at all times Ice as supplemental pain support  [name] 11/13/2023, 6:22 AM

## 2023-11-14 DIAGNOSIS — I350 Nonrheumatic aortic (valve) stenosis: Secondary | ICD-10-CM

## 2023-11-14 DIAGNOSIS — R55 Syncope and collapse: Secondary | ICD-10-CM | POA: Diagnosis not present

## 2023-11-14 LAB — BASIC METABOLIC PANEL WITH GFR
Anion gap: 10 (ref 5–15)
BUN: 40 mg/dL — ABNORMAL HIGH (ref 8–23)
CO2: 22 mmol/L (ref 22–32)
Calcium: 7.6 mg/dL — ABNORMAL LOW (ref 8.9–10.3)
Chloride: 105 mmol/L (ref 98–111)
Creatinine, Ser: 1.37 mg/dL — ABNORMAL HIGH (ref 0.44–1.00)
GFR, Estimated: 41 mL/min — ABNORMAL LOW (ref >60)
Glucose, Bld: 165 mg/dL — ABNORMAL HIGH (ref 70–99)
Potassium: 4 mmol/L (ref 3.5–5.1)
Sodium: 137 mmol/L (ref 135–145)

## 2023-11-14 LAB — GLUCOSE, CAPILLARY
Glucose-Capillary: 193 mg/dL — ABNORMAL HIGH (ref 70–99)
Glucose-Capillary: 171 mg/dL — ABNORMAL HIGH (ref 70–99)
Glucose-Capillary: 205 mg/dL — ABNORMAL HIGH (ref 70–99)
Glucose-Capillary: 188 mg/dL — ABNORMAL HIGH (ref 70–99)
Glucose-Capillary: 154 mg/dL — ABNORMAL HIGH (ref 70–99)

## 2023-11-14 LAB — CBC WITH DIFFERENTIAL/PLATELET
Abs Immature Granulocytes: 0.06 K/uL (ref 0.00–0.07)
Basophils Absolute: 0 K/uL (ref 0.0–0.1)
Basophils Relative: 0 %
Eosinophils Absolute: 0.7 K/uL — ABNORMAL HIGH (ref 0.0–0.5)
Eosinophils Relative: 5 %
HCT: 35.4 % — ABNORMAL LOW (ref 36.0–46.0)
Hemoglobin: 11.4 g/dL — ABNORMAL LOW (ref 12.0–15.0)
Immature Granulocytes: 0 %
Lymphocytes Relative: 23 %
Lymphs Abs: 3.1 K/uL (ref 0.7–4.0)
MCH: 30 pg (ref 26.0–34.0)
MCHC: 32.2 g/dL (ref 30.0–36.0)
MCV: 93.2 fL (ref 80.0–100.0)
Monocytes Absolute: 1.1 K/uL — ABNORMAL HIGH (ref 0.1–1.0)
Monocytes Relative: 8 %
Neutro Abs: 8.7 K/uL — ABNORMAL HIGH (ref 1.7–7.7)
Neutrophils Relative %: 64 %
Platelets: 233 K/uL (ref 150–400)
RBC: 3.8 MIL/uL — ABNORMAL LOW (ref 3.87–5.11)
RDW: 12.3 % (ref 11.5–15.5)
WBC: 13.7 K/uL — ABNORMAL HIGH (ref 4.0–10.5)
nRBC: 0 % (ref 0.0–0.2)

## 2023-11-14 LAB — MAGNESIUM: Magnesium: 1.7 mg/dL (ref 1.7–2.4)

## 2023-11-14 MED ORDER — HYDROCODONE-ACETAMINOPHEN 5-325 MG PO TABS: 1.0000 | ORAL_TABLET | Freq: Four times a day (QID) | ORAL | 0 refills | Status: DC | PRN

## 2023-11-14 NOTE — Progress Notes (Addendum)
 PROGRESS NOTE    [name]  FMW:[id number] DOB: 1951-01-27 DOA: 11/11/2023 PCP: Patient, No Pcp Per   Brief Narrative: Jennifer Ingram is a 73 y.o. female with a history of diabetes mellitus type 2, hypertension, hyperlipidemia, CKD stage IIIb, hypothyroidism, depression.  Patient presented secondary to syncope with resultant right shoulder pain and found to have acute humeral fracture.  Orthopedic surgery recommendations for outpatient follow-up and eventual ORIF.  Hospitalization complicated by AKI, treated with IV fluids.  Echocardiogram significant for moderate aortic stenosis with patient also having low normal blood pressures possibly contributing to syncopal episode.  Cardiology consulted..   Assessment and Plan:  Syncope and collapse Present on admission.  Unclear etiology.  Patient monitored on telemetry.  Syncope workup significant for no acute CT head or MRI brain findings.  Echocardiogram significant for at least moderate aortic stenosis with normal LV function.  Patient did have hypotension during admission which could be contributory to syncopal episode.  Patient's home atenolol  was held. -Cardiology consultation for aortic stenosis -Continue telemetry   Displaced right proximal humerus fracture Secondary to fall. X-ray significant for a displaced acute surgical neck fracture of the right proximal humerus. Orthopedic surgery consulted with recommendation for sling, non-weight bearing and outpatient follow-up with Dr. Selinda Gosling with eventual plan for ORIF. -Continue Norco as needed   Diabetes mellitus type 2 Poorly controlled based off last hemoglobin A1c of greater than 15.5%.  Patient is listed as taking Humulin  70/30 as an outpatient.  Patient started on Semglee , NovoLog  with meals and SSI this admission. - Continue Semglee  15 units daily - Continue NovoLog  5 units 3 times daily with meals - Continue SSI   Moderate aortic stenosis Noted on echocardiogram. Unclear  if this is contributory to presentation. -Cardiology consultation   Leukocytosis Mild on admission. Likely reactive secondary to acute fracture. Stable. No signs or symptoms of infection.   AKI on CKD stage IIIb Baseline creatinine of about 1.0 - 1.2. Creatinine of 1.06 on admission with worsening to a peak of 1.86. Possibly secondary to hypotension. Improved with IV fluids. Repeat BMP in 3-5 days.   Primary hypertension Patient with some hypotension during admission. Might have contributed to presentation. Hold atenolol  on discharge.   Hyperlipidemia Continue Crestor .   Hypothyroidism Continue Euthyrox    Anxiety Depression Continue Paroxetine    DVT prophylaxis: SCDs Code Status:   Code Status: Full Code Family Communication: None at bedside Disposition Plan: Discharge to SNF likely in 24 hours pending cardiology recommendations   Consultants:  Cardiology Orthopedic surgery  Procedures:  None  Antimicrobials: None    Subjective: Patient with continued right arm pain. Concerned about disposition.  Objective: BP (!) 105/54 (BP Location: Left Arm)   Pulse 68   Temp 98.5 F (36.9 C)   Resp 18   Ht 5' (1.524 m)   Wt 68.9 kg   SpO2 97%   BMI 29.67 kg/m   Examination:  General exam: Appears calm and comfortable Respiratory system: Clear to auscultation. Respiratory effort normal. Cardiovascular system: S1 & S2 heard, RRR. 2/6 systolic heart murmur Gastrointestinal system: Abdomen is nondistended, soft and nontender. Normal bowel sounds heard. Central nervous system: Alert. No focal neurological deficits. Musculoskeletal: No edema. No calf tenderness. Right arm in sling   Data Reviewed: I have personally reviewed following labs and imaging studies  CBC Lab Results  Component Value Date   WBC 13.7 (H) 11/14/2023   RBC 3.80 (L) 11/14/2023   HGB 11.4 (L) 11/14/2023  HCT 35.4 (L) 11/14/2023   MCV 93.2 11/14/2023   MCH 30.0 11/14/2023   PLT 233  11/14/2023   MCHC 32.2 11/14/2023   RDW 12.3 11/14/2023   LYMPHSABS 3.1 11/14/2023   MONOABS 1.1 (H) 11/14/2023   EOSABS 0.7 (H) 11/14/2023   BASOSABS 0.0 11/14/2023     Last metabolic panel Lab Results  Component Value Date   NA 137 11/14/2023   K 4.0 11/14/2023   CL 105 11/14/2023   CO2 22 11/14/2023   BUN 40 (H) 11/14/2023   CREATININE 1.37 (H) 11/14/2023   GLUCOSE 165 (H) 11/14/2023   GFRNONAA 41 (L) 11/14/2023   GFRAA >60 11/06/2016   CALCIUM  7.6 (L) 11/14/2023   PROT 6.6 11/11/2023   ALBUMIN 3.5 11/11/2023   BILITOT 0.8 11/11/2023   ALKPHOS 75 11/11/2023   AST 17 11/11/2023   ALT 10 11/11/2023   ANIONGAP 10 11/14/2023    GFR: Estimated Creatinine Clearance: 32.6 mL/min (A) (by C-G formula based on SCr of 1.37 mg/dL (H)).  No results found for this or any previous visit (from the past 240 hours).    Radiology Studies: ECHOCARDIOGRAM COMPLETE Result Date: 11/12/2023    ECHOCARDIOGRAM REPORT   Patient Name:   Jennifer Ingram Date of Exam: 11/12/2023 Medical Rec #:  [id number]      Height:       60.0 in Accession #:    [id number]     Weight:       147.0 lb Date of Birth:  [birth year]      BSA:          1.638 m Patient Age:    73 years       BP:           114/67 mmHg Patient Gender: F              HR:           79 bpm. Exam Location:  Inpatient Procedure: 2D Echo, Cardiac Doppler, Color Doppler and Intracardiac            Opacification Agent (Both Spectral and Color Flow Doppler were            utilized during procedure). Indications:    Syncope  History:        Patient has no prior history of Echocardiogram examinations.                 Risk Factors:Hypertension, Diabetes and Dyslipidemia.  Sonographer:    Therisa Crouch Referring Phys: [id number] ALLISON WOLFE IMPRESSIONS  1. Left ventricular ejection fraction, by estimation, is 60 to 65%. The left ventricle has normal function. The left ventricle has no regional wall motion abnormalities. There is mild left ventricular hypertrophy.  Left ventricular diastolic parameters were normal.  2. Right ventricular systolic function is mildly reduced. The right ventricular size is normal. Mildly increased right ventricular wall thickness. Tricuspid regurgitation signal is inadequate for assessing PA pressure.  3. The mitral valve is degenerative. No evidence of mitral valve regurgitation. No evidence of mitral stenosis.  4. The aortic valve is tricuspid. Aortic valve regurgitation is mild. Paradoxical low flow, low gradient atleast moderate AS (peak velocity 2.38m/s, MG , AVA (VTI) 1.07cm2, planimetry 0.89cm2, DI 0.42, SVi 26).  5. The inferior vena cava is normal in size with <50% respiratory variability, suggesting right atrial pressure of 8 mmHg. Comparison(s): No prior Echocardiogram. Conclusion(s)/Recommendation(s): No left ventricular mural or apical thrombus/thrombi. Either consider aortic valve calcium  score or invasive hemodynamics  to further clarify the severity of aortic stenosis. FINDINGS  Left Ventricle: Left ventricular ejection fraction, by estimation, is 60 to 65%. The left ventricle has normal function. The left ventricle has no regional wall motion abnormalities. The left ventricular internal cavity size was small. There is mild left ventricular hypertrophy. Left ventricular diastolic parameters were normal. Right Ventricle: The right ventricular size is normal. Mildly increased right ventricular wall thickness. Right ventricular systolic function is mildly reduced. Tricuspid regurgitation signal is inadequate for assessing PA pressure. Left Atrium: Left atrial size was normal in size. Right Atrium: Right atrial size was normal in size. Pericardium: There is no evidence of pericardial effusion. Mitral Valve: The mitral valve is degenerative in appearance. There is mild thickening of the mitral valve leaflet(s). Normal mobility of the mitral valve leaflets. Mild mitral annular calcification. No evidence of mitral valve regurgitation.  No evidence  of mitral valve stenosis. Tricuspid Valve: The tricuspid valve is grossly normal. Tricuspid valve regurgitation is not demonstrated. No evidence of tricuspid stenosis. Aortic Valve: The aortic valve is tricuspid. Aortic valve regurgitation is mild. Paradoxical low flow, low gradient atleast moderate AS (peak velocity 2.28m/s, MG , AVA (VTI) 1.07cm2, planimetry 0.89cm2, DI 0.42, SVi 26). Aortic valve mean gradient measures 10.5 mmHg. Aortic valve peak gradient measures 19.4 mmHg. Aortic valve area, by VTI measures 1.07 cm. Pulmonic Valve: The pulmonic valve was normal in structure. Pulmonic valve regurgitation is mild. No evidence of pulmonic stenosis. Aorta: The aortic root and ascending aorta are structurally normal, with no evidence of dilitation. Venous: The inferior vena cava is normal in size with less than 50% respiratory variability, suggesting right atrial pressure of 8 mmHg. IAS/Shunts: The atrial septum is grossly normal.  LEFT VENTRICLE PLAX 2D LVIDd:         3.20 cm     Diastology LVIDs:         2.00 cm     LV e' medial:    6.31 cm/s LV PW:         1.20 cm     LV E/e' medial:  10.2 LV IVS:        1.10 cm     LV e' lateral:   5.22 cm/s LVOT diam:     1.80 cm     LV E/e' lateral: 12.3 LV SV:         42 LV SV Index:   26 LVOT Area:     2.54 cm  LV Volumes (MOD) LV vol d, MOD A2C: 51.4 ml LV vol d, MOD A4C: 51.3 ml LV vol s, MOD A2C: 18.8 ml LV vol s, MOD A4C: 19.3 ml LV SV MOD A2C:     32.6 ml LV SV MOD A4C:     51.3 ml LV SV MOD BP:      33.0 ml RIGHT VENTRICLE            IVC RV S prime:     8.49 cm/s  IVC diam: 1.90 cm TAPSE (M-mode): 1.6 cm LEFT ATRIUM             Index LA diam:        2.90 cm 1.77 cm/m LA Vol (A2C):   34.6 ml 21.12 ml/m LA Vol (A4C):   38.3 ml 23.38 ml/m LA Biplane Vol: 38.7 ml 23.63 ml/m  AORTIC VALVE AV Area (Vmax):    1.01 cm AV Area (Vmean):   0.99 cm AV Area (VTI):     1.07 cm AV Vmax:  220.00 cm/s AV Vmean:          152.500 cm/s AV VTI:             0.398 m AV Peak Grad:      19.4 mmHg AV Mean Grad:      10.5 mmHg LVOT Vmax:         87.00 cm/s LVOT Vmean:        59.600 cm/s LVOT VTI:          0.167 m LVOT/AV VTI ratio: 0.42  AORTA Ao Root diam: 3.20 cm Ao Asc diam:  3.20 cm MITRAL VALVE MV Area (PHT): 3.07 cm    SHUNTS MV Decel Time: 247 msec    Systemic VTI:  0.17 m MV E velocity: 64.30 cm/s  Systemic Diam: 1.80 cm MV A velocity: 84.00 cm/s MV E/A ratio:  0.77 Sunit Tolia Electronically signed by Madonna Large Signature Date/Time: 11/12/2023/5:02:42 PM    Final       LOS: 3 days    Elgin Lam, MD [facility] 11/14/2023, 11:38 AM   If 7PM-7AM, please contact night-coverage [email or web address]

## 2023-11-14 NOTE — Progress Notes (Signed)
 Physical Therapy Treatment Patient Details Name: Jennifer Ingram MRN: [id number] DOB: 1950-09-21 Today's Date: 11/14/2023   History of Present Illness Pt is 73 yo female presented on 11/11/23 after syncope and collapse - treated with IV fluids.  Pt found to have R proximal humerus fx with orthopedic consult -pt will need ORIF but no surgeon available this admission and plans to f/u in office per ortho note.  Pt with hx including but not limited to T2DM, HLD, HTN, CKD stage 3b, hypothyroidism, depression.  Noted earlier hospital admission for orthostatic hypotension and pt also reports hx of vertigo.    PT Comments  Pt more alert and with some improvement in transfers and sitting balance but still very limited, needs assist of 2, and had drop in BP with syncopal symptoms that limited further therapy (see comments). Continue to recommend Patient will benefit from continued inpatient follow up therapy, <3 hours/day at d/c.  Had mentioned could benefit from OT at eval; however, still not appropriate/able to participate and appears sx likely not to occur during admission. In regards to vestibular, still limited testing due to pt's tolerance and currently more limited by orthostatic hypotension.    Pt alert today.  Noted low BP last night (last one documented at time of tx was 105/54 at 20:38 on 7/15).  Took BP in supine and was 113/60, sitting 96/66 and pt symptomatic.  Performed LE and L UE exercises.  After 7 mins BP up to 103/74 but pt reports further lightheaded, more pale, and less responsive.  Scooted toward [facility] and returned to supine.  Immediate return to supine 96/52.  After 3 mins 100/53.  HR stable 60's-70's, sats 99-100% on 2 L O2.  Note: BP taken in L distal arm with small cuff (upper arm with IV, R arm w fracture).  Pt reports immediate improvement in symptoms with supine.     If plan is discharge home, recommend the following: Two people to help with walking and/or transfers;Two people to help  with bathing/dressing/bathroom   Can travel by private vehicle     No  Equipment Recommendations  Wheelchair cushion (measurements PT);Wheelchair (measurements PT);Hospital bed    Recommendations for Other Services       Precautions / Restrictions Precautions Precautions: None Precaution/Restrictions Comments: R UE in sling; hypotension Required Braces or Orthoses: Sling Restrictions RUE Weight Bearing Per Provider Order: Non weight bearing     Mobility  Bed Mobility Overal bed mobility: Needs Assistance Bed Mobility: Rolling, Sidelying to Sit, Sit to Sidelying Rolling: Mod assist Sidelying to sit: Mod assist, +2 for physical assistance     Sit to sidelying: Mod assist, +2 for physical assistance General bed mobility comments: Rolled to L and sat EOB on L    Transfers Overall transfer level: Needs assistance   Transfers: Bed to chair/wheelchair/BSC            Lateral/Scoot Transfers: Mod assist General transfer comment: Lateral scoot toward HOB.  Unable to tolerate standing or transfer due to low BP despite performing exercises and increased time at EOB.    Ambulation/Gait                   Stairs             Wheelchair Mobility     Tilt Bed    Modified Rankin (Stroke Patients Only)       Balance Overall balance assessment: Needs assistance Sitting-balance support: Single extremity supported Sitting balance-Leahy Scale: Poor Sitting balance -  Comments: Needs UE support and close guarding                                    Communication    Cognition Arousal: Alert Behavior During Therapy: WFL for tasks assessed/performed   PT - Cognitive impairments: Initiation                       PT - Cognition Comments: More alert today and able to answer questions but needs cues for transfer techniques        Cueing    Exercises General Exercises - Lower Extremity Ankle Circles/Pumps: AROM, Both, 20 reps,  Seated Long Arc Quad: AROM, Both, 20 reps, Seated Other Exercises Other Exercises: L shoulder flexion 10x2 AAROM    General Comments General comments (skin integrity, edema, etc.): Pt alert today.  Noted low BP last night (last one documented at time of tx was 105/54 at 20:38 on 7/15).  Took BP in supine and was 113/60, sitting 96/66 and pt symptomatic.  Performed LE and L UE exercises.  After 7 mins BP up to 103/74 but pt reports further lightheaded, more pale, and less responsive.  Scooted toward [facility] and returned to supine.  Immediate return to supine 96/52.  After 3 mins 100/53.  HR stable 60's-70's, sats 99-100% on 2 L O2.  Note: BP taken in L distal arm with small cuff (upper arm with IV, R arm w fracture).  Pt reports immediate improvement in symptoms with supine.  Vestibular: Does report hx of vertigo/spinning in addition to syncopal sx.  States for past month occurs when standing/moving but also some symptoms with rolling/head turns but not bad with those activities.  Denies any colds/virus/sinus/ear infections recently and denies any jarring activities or falls prior to one at admission.  Able to partially perform ocular motor exam but not able to tolerate further: Resting Nystagmus: negative Gaze Induced Nystagmus: Negative Smooth Pursuit: coordinated but some mild dizziness Gaze stabilization: intact but limited head movement Unable to further test/tolerate        Pertinent Vitals/Pain Pain Assessment Pain Assessment: Faces Faces Pain Scale: Hurts little more Pain Location: R arm Pain Descriptors / Indicators: Grimacing, Discomfort, Sharp Pain Intervention(s): Limited activity within patient's tolerance, Monitored during session, Repositioned    Home Living                          Prior Function            PT Goals (current goals can now be found in the care plan section) Progress towards PT goals: Progressing toward goals (limited)    Frequency    Min  2X/week      PT Plan      Co-evaluation              AM-PAC PT 6 Clicks Mobility   Outcome Measure  Help needed turning from your back to your side while in a flat bed without using bedrails?: A Lot Help needed moving from lying on your back to sitting on the side of a flat bed without using bedrails?: A Lot Help needed moving to and from a bed to a chair (including a wheelchair)?: Total Help needed standing up from a chair using your arms (e.g., wheelchair or bedside chair)?: Total Help needed to walk in hospital room?: Total Help needed climbing 3-5 steps  with a railing? : Total 6 Click Score: 8    End of Session Equipment Utilized During Treatment: Oxygen Activity Tolerance: Treatment limited secondary to medical complications (Comment) Patient left: in bed;with call bell/phone within reach;with bed alarm set Nurse Communication: Mobility status;Other (comment) (Notified RN and MD of limited participation due to low BP) PT Visit Diagnosis: Other abnormalities of gait and mobility (R26.89);Muscle weakness (generalized) (M62.81)     Time: 8992-8963 PT Time Calculation (min) (ACUTE ONLY): 29 min  Charges:    $Therapeutic Activity: 23-37 mins PT General Charges $$ ACUTE PT VISIT: 1 Visit                     Jennifer, PT Acute Rehab Services [facility] [phone]    Jennifer Ingram 11/14/2023, 12:08 PM

## 2023-11-14 NOTE — TOC Progression Note (Signed)
 Transition of Care [facility]) - Progression Note    Patient Details  Name: Jennifer Ingram MRN: [id number] Date of Birth: December 10, 1950  Transition of Care [facility]) CM/SW Contact  Sheri ONEIDA [place], [place] Phone Number: 11/14/2023, 2:49 PM  Clinical Narrative:    Ins auth approved. Pending medical readiness.   Expected Discharge Plan: Skilled Nursing Facility Barriers to Discharge: Continued Medical Work up  Expected Discharge Plan and Services In-house Referral: NA Discharge Planning Services: NA Post Acute Care Choice: Skilled Nursing Facility Living arrangements for the past 2 months: Mobile Home                 DME Arranged: N/A DME Agency: NA       HH Arranged: NA HH Agency: NA         Social Determinants of Health (SDOH) Interventions SDOH Screenings   Food Insecurity: No Food Insecurity (11/11/2023)  Housing: Low Risk  (11/11/2023)  Transportation Needs: No Transportation Needs (11/11/2023)  Utilities: Not At Risk (11/11/2023)  Alcohol Screen: Low Risk  (10/10/2022)  Depression (PHQ2-9): Low Risk  (10/10/2022)  Physical Activity: Sufficiently Active (10/10/2022)  Social Connections: Moderately Integrated (11/11/2023)  Stress: No Stress Concern Present (10/10/2022)  Tobacco Use: Low Risk  (11/11/2023)    Readmission Risk Interventions    11/12/2023    4:30 PM 07/21/2023    9:11 AM 01/06/2023    1:24 PM  Readmission Risk Prevention Plan  Post Dischage Appt Complete    Medication Screening Complete    Transportation Screening Complete Complete Complete  PCP or Specialist Appt within 5-7 Days  Complete Complete  Home Care Screening  Complete Complete  Medication Review (RN CM)  Complete Complete

## 2023-11-14 NOTE — Hospital Course (Signed)
 Jennifer Ingram is a 73 y.o. female with a history of diabetes mellitus type 2, hypertension, hyperlipidemia, CKD stage IIIb, hypothyroidism, depression.  Patient presented secondary to syncope with resultant right shoulder pain and found to have acute humeral fracture.  Orthopedic surgery recommendations for outpatient follow-up and eventual ORIF.  Hospitalization complicated by AKI, treated with IV fluids.  Echocardiogram significant for moderate aortic stenosis with patient also having low normal blood pressures possibly contributing to syncopal episode.  Cardiology consulted.SABRA

## 2023-11-14 NOTE — Discharge Summary (Signed)
 Physician Discharge Summary   Patient: Jennifer Ingram MRN: [id number] DOB: 12/12/1950  Admit date:     11/11/2023  Discharge date: {dischdate:26783}  Discharge Physician: Elgin Lam   PCP: Patient, No Pcp Per   Recommendations at discharge:  PCP visit for hospital follow-up Orthopedic surgery follow-up with Dr. Sharl this week for planning of ORIF next week Maintain right upper extremity in sling at all times (for comfort)  Discharge Diagnoses: Principal Problem:   Syncope and collapse Active Problems:   2-part displaced fracture of surgical neck of right humerus, initial encounter for closed fracture   Uncontrolled type 2 diabetes mellitus with hyperglycemia, with long-term current use of insulin  (HCC)   Leukocytosis   Hypertension   CKD stage 3b, GFR 30-44 ml/min (HCC)   Hyperlipidemia   Depression   Hypothyroid  Resolved Problems:   * No resolved hospital problems. *  Hospital Course: [name] is a 73 y.o. female with a history of diabetes mellitus type 2, hypertension, hyperlipidemia, CKD stage IIIb, hypothyroidism, depression.  Patient presented secondary to syncope with resultant right shoulder pain and found to have acute humeral fracture.  Orthopedic surgery recommendations for outpatient follow-up and eventual ORIF.  Hospitalization complicated by AKI, treated with IV fluids.  Echocardiogram significant for moderate aortic stenosis with patient also having low normal blood pressures possibly contributing to syncopal episode.  Cardiology consulted..  Assessment and Plan:  Syncope and collapse Present on admission.  Unclear etiology.  Patient monitored on telemetry.  Syncope workup significant for no acute CT head or MRI brain findings.  Echocardiogram significant for at least moderate aortic stenosis with normal LV function.  Patient did have hypotension during admission which could be contributory to syncopal episode.  Patient's home atenolol  was held.  ***  displaced *** fracture ***  Diabetes mellitus type 2 *** controlled.Continue Humulin  70/30 on discharge.  Moderate aortic stenosis Noted on echocardiogram.  Leukocytosis Mild on admission. Likely reactive secondary to acute fracture. Stable. No signs or symptoms of infection.  AKI on CKD stage IIIb Baseline creatinine of about 1.0 - 1.2. Creatinine of 1.06 on admission with worsening to a peak of 1.86. Possibly secondary to hypotension. Improved with IV fluids. Repeat BMP in 3-5 days.  Primary hypertension Patient with some hypotension during admission. Might have contributed to presentation. Hold atenolol  on discharge.  Hyperlipidemia Continue Crestor .  Hypothyroidism Continue Euthyrox   Anxiety Depression Continue Paroxetine    Consultants: *** Procedures performed: ***  Disposition: {Plan; Disposition:26390} Diet recommendation:  {Diet_Plan:26776} DISCHARGE MEDICATION: Allergies as of 11/15/2023   No Known Allergies      Medication List     STOP taking these medications    atenolol  50 MG tablet Commonly known as: TENORMIN        TAKE these medications    Accu-Chek Guide test strip Generic drug: glucose blood Check blood sugar 3 times per day   Accu-Chek Softclix Lancets lancets Check blood sugar 3 times a day   Euthyrox  50 MCG tablet Generic drug: levothyroxine  Take 1 tablet by mouth once daily   FreeStyle Libre 3 Reader Espiridion USE AS DIRECTED   FreeStyle Libre 3 Sensor Misc PLACE SENSOR ON THE SKIN EVERY 14 DAYS TO CHECK GLUXOSE CONTINUOUSLY   gabapentin  300 MG capsule Commonly known as: NEURONTIN  Take 300 mg by mouth at bedtime.   HumuLIN  70/30 KwikPen (70-30) 100 UNIT/ML KwikPen Generic drug: insulin  isophane & regular human KwikPen Inject 10 Units into the skin 2 (two) times daily.   HYDROcodone -acetaminophen  5-325  MG tablet Commonly known as: NORCO/VICODIN Take 1 tablet by mouth every 6 (six) hours as needed for moderate pain (pain  score 4-6).   PARoxetine  40 MG tablet Commonly known as: PAXIL  TAKE 1 TABLET BY MOUTH ONCE DAILY AFTER BREAKFAST   Pen Needles 31G X 5 MM Misc 1 each by Does not apply route daily.   rosuvastatin  20 MG tablet Commonly known as: CRESTOR  Take 1 tablet (20 mg total) by mouth daily.        Contact information for follow-up providers     Sharl Selinda Dover, MD Follow up in 3 day(s).   Specialty: Orthopedic Surgery Contact information: [address] [place] [place] [address] [phone]              Contact information for after-discharge care     Destination     [facility] [place] .   Service: Skilled Nursing Contact information: [address] [place]  [address] [phone]                    Discharge Exam: Filed Weights   11/13/23 0500 11/14/23 0500 11/15/23 0500  Weight: 66.9 kg 68.9 kg 71.9 kg   ***  Condition at discharge: {DC Condition:26389}  The results of significant diagnostics from this hospitalization (including imaging, microbiology, ancillary and laboratory) are listed below for reference.   Imaging Studies: ECHOCARDIOGRAM COMPLETE Result Date: 11/12/2023    ECHOCARDIOGRAM REPORT   Patient Name:   Jennifer Ingram Date of Exam: 11/12/2023 Medical Rec #:  [id number]      Height:       60.0 in Accession #:    [id number]     Weight:       147.0 lb Date of Birth:  08-30-1950      BSA:          1.638 m Patient Age:    73 years       BP:           114/67 mmHg Patient Gender: F              HR:           79 bpm. Exam Location:  Inpatient Procedure: 2D Echo, Cardiac Doppler, Color Doppler and Intracardiac            Opacification Agent (Both Spectral and Color Flow Doppler were            utilized during procedure). Indications:    Syncope  History:        Patient has no prior history of Echocardiogram examinations.                 Risk Factors:Hypertension, Diabetes and Dyslipidemia.  Sonographer:     Therisa Crouch Referring Phys: [id number] ALLISON WOLFE IMPRESSIONS  1. Left ventricular ejection fraction, by estimation, is 60 to 65%. The left ventricle has normal function. The left ventricle has no regional wall motion abnormalities. There is mild left ventricular hypertrophy. Left ventricular diastolic parameters were normal.  2. Right ventricular systolic function is mildly reduced. The right ventricular size is normal. Mildly increased right ventricular wall thickness. Tricuspid regurgitation signal is inadequate for assessing PA pressure.  3. The mitral valve is degenerative. No evidence of mitral valve regurgitation. No evidence of mitral stenosis.  4. The aortic valve is tricuspid. Aortic valve regurgitation is mild. Paradoxical low flow, low gradient atleast moderate AS (peak velocity 2.71m/s, MG , AVA (VTI) 1.07cm2, planimetry 0.89cm2, DI  0.42, SVi 26).  5. The inferior vena cava is normal in size with <50% respiratory variability, suggesting right atrial pressure of 8 mmHg. Comparison(s): No prior Echocardiogram. Conclusion(s)/Recommendation(s): No left ventricular mural or apical thrombus/thrombi. Either consider aortic valve calcium  score or invasive hemodynamics to further clarify the severity of aortic stenosis. FINDINGS  Left Ventricle: Left ventricular ejection fraction, by estimation, is 60 to 65%. The left ventricle has normal function. The left ventricle has no regional wall motion abnormalities. The left ventricular internal cavity size was small. There is mild left ventricular hypertrophy. Left ventricular diastolic parameters were normal. Right Ventricle: The right ventricular size is normal. Mildly increased right ventricular wall thickness. Right ventricular systolic function is mildly reduced. Tricuspid regurgitation signal is inadequate for assessing PA pressure. Left Atrium: Left atrial size was normal in size. Right Atrium: Right atrial size was normal in size. Pericardium: There is  no evidence of pericardial effusion. Mitral Valve: The mitral valve is degenerative in appearance. There is mild thickening of the mitral valve leaflet(s). Normal mobility of the mitral valve leaflets. Mild mitral annular calcification. No evidence of mitral valve regurgitation. No evidence  of mitral valve stenosis. Tricuspid Valve: The tricuspid valve is grossly normal. Tricuspid valve regurgitation is not demonstrated. No evidence of tricuspid stenosis. Aortic Valve: The aortic valve is tricuspid. Aortic valve regurgitation is mild. Paradoxical low flow, low gradient atleast moderate AS (peak velocity 2.70m/s, MG , AVA (VTI) 1.07cm2, planimetry 0.89cm2, DI 0.42, SVi 26). Aortic valve mean gradient measures 10.5 mmHg. Aortic valve peak gradient measures 19.4 mmHg. Aortic valve area, by VTI measures 1.07 cm. Pulmonic Valve: The pulmonic valve was normal in structure. Pulmonic valve regurgitation is mild. No evidence of pulmonic stenosis. Aorta: The aortic root and ascending aorta are structurally normal, with no evidence of dilitation. Venous: The inferior vena cava is normal in size with less than 50% respiratory variability, suggesting right atrial pressure of 8 mmHg. IAS/Shunts: The atrial septum is grossly normal.  LEFT VENTRICLE PLAX 2D LVIDd:         3.20 cm     Diastology LVIDs:         2.00 cm     LV e' medial:    6.31 cm/s LV PW:         1.20 cm     LV E/e' medial:  10.2 LV IVS:        1.10 cm     LV e' lateral:   5.22 cm/s LVOT diam:     1.80 cm     LV E/e' lateral: 12.3 LV SV:         42 LV SV Index:   26 LVOT Area:     2.54 cm  LV Volumes (MOD) LV vol d, MOD A2C: 51.4 ml LV vol d, MOD A4C: 51.3 ml LV vol s, MOD A2C: 18.8 ml LV vol s, MOD A4C: 19.3 ml LV SV MOD A2C:     32.6 ml LV SV MOD A4C:     51.3 ml LV SV MOD BP:      33.0 ml RIGHT VENTRICLE            IVC RV S prime:     8.49 cm/s  IVC diam: 1.90 cm TAPSE (M-mode): 1.6 cm LEFT ATRIUM             Index LA diam:        2.90 cm 1.77 cm/m LA  Vol (A2C):   34.6 ml 21.12 ml/m  LA Vol (A4C):   38.3 ml 23.38 ml/m LA Biplane Vol: 38.7 ml 23.63 ml/m  AORTIC VALVE AV Area (Vmax):    1.01 cm AV Area (Vmean):   0.99 cm AV Area (VTI):     1.07 cm AV Vmax:           220.00 cm/s AV Vmean:          152.500 cm/s AV VTI:            0.398 m AV Peak Grad:      19.4 mmHg AV Mean Grad:      10.5 mmHg LVOT Vmax:         87.00 cm/s LVOT Vmean:        59.600 cm/s LVOT VTI:          0.167 m LVOT/AV VTI ratio: 0.42  AORTA Ao Root diam: 3.20 cm Ao Asc diam:  3.20 cm MITRAL VALVE MV Area (PHT): 3.07 cm    SHUNTS MV Decel Time: 247 msec    Systemic VTI:  0.17 m MV E velocity: 64.30 cm/s  Systemic Diam: 1.80 cm MV A velocity: 84.00 cm/s MV E/A ratio:  0.77 Sunit Tolia Electronically signed by Madonna Large Signature Date/Time: 11/12/2023/5:02:42 PM    Final    MR BRAIN WO CONTRAST Result Date: 11/12/2023 EXAM: MRI BRAIN WITHOUT CONTRAST 11/12/2023 08:33:09 AM TECHNIQUE: Multiplanar multisequence MRI of the head/brain was performed without the administration of intravenous contrast. COMPARISON: None available. CLINICAL HISTORY: Head trauma, abnormal mental status (Age 79-64y). MR BRAIN WO; altered mental status FINDINGS: BRAIN AND VENTRICLES: No acute infarct. No intracranial hemorrhage. No mass. No midline shift. No hydrocephalus. The sella is unremarkable. Normal flow voids. Mild atrophy and right matter changes are within normal limits for age. ORBITS: Bilateral lens replacements are noted. The globes and orbits are otherwise within normal limits. SINUSES AND MASTOIDS: No acute abnormality. BONES AND SOFT TISSUES: Normal marrow signal. No acute soft tissue abnormality. IMPRESSION: 1. No acute intracranial abnormality related to head trauma or altered mental status. Normal MRI of the brain for age. Electronically signed by: Lonni Necessary MD 11/12/2023 08:47 AM EDT RP Workstation: [id number]   CT CHEST ABDOMEN PELVIS W CONTRAST Result Date: 11/11/2023 CLINICAL DATA:   Syncope, right shoulder deformity. Earlier imaging today revealed a surgical neck fracture the right proximal humerus. EXAM: CT CHEST, ABDOMEN, AND PELVIS WITH CONTRAST TECHNIQUE: Multidetector CT imaging of the chest, abdomen and pelvis was performed following the standard protocol during bolus administration of intravenous contrast. RADIATION DOSE REDUCTION: This exam was performed according to the departmental dose-optimization program which includes automated exposure control, adjustment of the mA and/or kV according to patient size and/or use of iterative reconstruction technique. CONTRAST:  80mL OMNIPAQUE  IOHEXOL  300 MG/ML  SOLN COMPARISON:  Radiographs from 11/11/2023 and CT abdomen from 07/21/2023 FINDINGS: CT CHEST FINDINGS Cardiovascular: Atheromatous vascular calcification the aortic arch. Mild aortic valve calcification. Mediastinum/Nodes: Moderate-sized hiatal hernia. Lungs/Pleura: Biapical pleuroparenchymal scarring. Dependent subsegmental atelectasis in both lower lobes. Mild scarring in the vicinity of the right minor fissure. Musculoskeletal: Mild lower thoracic spondylosis. Neer 2 part surgical neck fracture of the right proximal humerus displaced 1.7 cm CT ABDOMEN PELVIS FINDINGS Hepatobiliary: Cholecystectomy. Stable mild prominence of the common bile duct at 1.1 cm, likely a physiologic response to cholecystectomy. Pancreas: Unremarkable Spleen: Unremarkable Adrenals/Urinary Tract: Unremarkable Stomach/Bowel: Unremarkable Vascular/Lymphatic: Atherosclerosis is present, including aortoiliac atherosclerotic disease. Reproductive: Unremarkable Other: No supplemental non-categorized findings. Musculoskeletal: Degenerative facet arthropathy bilaterally at L4-5 with grade  1 degenerative anterolisthesis of L4 on L5. Moderate right foraminal stenosis at L4-5 due to disc bulge, facet arthropathy, and disc uncovering. IMPRESSION: 1. Neer 2-part surgical neck fracture of the right proximal humerus displaced  1.7 cm. 2. No additional acute findings in the chest, abdomen, or pelvis. 3. Moderate-sized hiatal hernia. 4. Degenerative facet arthropathy bilaterally at L4-5 with grade 1 degenerative anterolisthesis of L4 on L5. Moderate right foraminal stenosis at L4-5 due to disc bulge, facet arthropathy, and disc uncovering. 5.  Aortic Atherosclerosis (ICD10-I70.0). Electronically Signed   By: Ryan Salvage M.D.   On: 11/11/2023 17:18   CT Head Wo Contrast Result Date: 11/11/2023 EXAM: CT HEAD AND CERVICAL SPINE 11/11/2023 05:01:25 PM TECHNIQUE: CT of the head and cervical spine was performed without the administration of intravenous contrast. Multiplanar reformatted images are provided for review. Automated exposure control, iterative reconstruction, and/or weight based adjustment of the mA/kV was utilized to reduce the radiation dose to as low as reasonably achievable. COMPARISON: CT head 01/04/2023 CLINICAL HISTORY: Head trauma, minor (Age >= 65y). Pt bib gcems for syncopal episode this morning. Son called 911. Patietn doesn't remember falling or having syncopal episode. Obvious deformity to right shoulder. FINDINGS: CT HEAD BRAIN AND VENTRICLES: No acute intracranial hemorrhage. No mass effect or midline shift. No abnormal extra-axial fluid collection. Gray-white differentiation is maintained. No hydrocephalus. ORBITS: No acute abnormality. SINUSES AND MASTOIDS: No acute abnormality. SOFT TISSUES AND SKULL: No acute skull fracture. No acute soft tissue abnormality. CT CERVICAL SPINE BONES AND ALIGNMENT: No acute fracture or traumatic malalignment. 3 mm degenerative anterolisthesis of C4 on C5. DEGENERATIVE CHANGES: Degenerative endplate changes at C5-6. Left greater than right facet arthropathy at C4-5. SOFT TISSUES: No prevertebral soft tissue swelling. VASCULATURE: Atherosclerotic calcifications of the carotid bulbs. IMPRESSION: 1. No acute intracranial abnormality. 2. No acute fracture or traumatic malalignment  of the cervical spine. Electronically signed by: Ryan Chess MD 11/11/2023 05:14 PM EDT RP Workstation: [id number]   CT Cervical Spine Wo Contrast Result Date: 11/11/2023 EXAM: CT HEAD AND CERVICAL SPINE 11/11/2023 05:01:25 PM TECHNIQUE: CT of the head and cervical spine was performed without the administration of intravenous contrast. Multiplanar reformatted images are provided for review. Automated exposure control, iterative reconstruction, and/or weight based adjustment of the mA/kV was utilized to reduce the radiation dose to as low as reasonably achievable. COMPARISON: CT head 01/04/2023 CLINICAL HISTORY: Head trauma, minor (Age >= 65y). Pt bib gcems for syncopal episode this morning. Son called 911. Patietn doesn't remember falling or having syncopal episode. Obvious deformity to right shoulder. FINDINGS: CT HEAD BRAIN AND VENTRICLES: No acute intracranial hemorrhage. No mass effect or midline shift. No abnormal extra-axial fluid collection. Gray-white differentiation is maintained. No hydrocephalus. ORBITS: No acute abnormality. SINUSES AND MASTOIDS: No acute abnormality. SOFT TISSUES AND SKULL: No acute skull fracture. No acute soft tissue abnormality. CT CERVICAL SPINE BONES AND ALIGNMENT: No acute fracture or traumatic malalignment. 3 mm degenerative anterolisthesis of C4 on C5. DEGENERATIVE CHANGES: Degenerative endplate changes at C5-6. Left greater than right facet arthropathy at C4-5. SOFT TISSUES: No prevertebral soft tissue swelling. VASCULATURE: Atherosclerotic calcifications of the carotid bulbs. IMPRESSION: 1. No acute intracranial abnormality. 2. No acute fracture or traumatic malalignment of the cervical spine. Electronically signed by: Ryan Chess MD 11/11/2023 05:14 PM EDT RP Workstation: [id number]   DG Chest Portable 1 View Result Date: 11/11/2023 CLINICAL DATA:  Right proximal humeral fracture, fall EXAM: PORTABLE CHEST 1 VIEW COMPARISON:  12/28/2022 FINDINGS: The displaced  right proximal humeral  surgical neck fracture is observed. The lungs appear clear. Cardiac and mediastinal contours normal. No blunting of the costophrenic angles. Mild lower thoracic spondylosis. IMPRESSION: 1. Displaced right proximal humeral surgical neck fracture. 2. No acute cardiopulmonary findings. 3. Mild lower thoracic spondylosis. Electronically Signed   By: Ryan Salvage M.D.   On: 11/11/2023 15:28   DG Pelvis Portable Result Date: 11/11/2023 CLINICAL DATA:  Fall, right proximal humeral fracture EXAM: PORTABLE PELVIS 1-2 VIEWS COMPARISON:  CT pelvis 07/21/2023 FINDINGS: Chronic degenerative disc disease at L4-5. No pelvic fracture or acute bony finding identified. IMPRESSION: 1. No pelvic fracture or acute bony finding identified. 2. Chronic degenerative disc disease at L4-5. Electronically Signed   By: Ryan Salvage M.D.   On: 11/11/2023 15:26   DG Elbow 2 Views Right Result Date: 11/11/2023 CLINICAL DATA:  Deformity, fall EXAM: RIGHT ELBOW - 2 VIEW COMPARISON:  None Available. FINDINGS: Displaced surgical neck fracture the right proximal humerus. The patient has very limited ability to move the elbow due to the proximal humeral fracture. Both views are very limited and oblique. No obvious/gross elbow fracture observed. Indeterminate for elbow effusion. IMPRESSION: 1. Displaced surgical neck fracture of the right proximal humerus. 2. Very limited ability to move the elbow due to the proximal humeral fracture. No obvious/gross elbow fracture observed. Indeterminate for elbow effusion, adversely affected negative predictive value due to positioning. Electronically Signed   By: Ryan Salvage M.D.   On: 11/11/2023 15:25   DG Humerus Right Result Date: 11/11/2023 CLINICAL DATA:  Fall, shoulder deformity EXAM: RIGHT HUMERUS - 2+ VIEW COMPARISON:  None Available. FINDINGS: Displaced acute surgical neck fracture the right proximal humerus. No other fracture is identified. IMPRESSION: 1.  Displaced acute surgical neck fracture of the right proximal humerus. Electronically Signed   By: Ryan Salvage M.D.   On: 11/11/2023 15:24   DG Shoulder Right Portable Result Date: 11/11/2023 CLINICAL DATA:  Fall, shoulder deformity EXAM: RIGHT SHOULDER - 1 VIEW COMPARISON:  None Available. FINDINGS: Surgical neck fracture proximal humerus displaced up to 2.5 cm. No other fracture is observed. IMPRESSION: 1. Surgical neck fracture right proximal humerus displaced up to 2.5 cm. Electronically Signed   By: Ryan Salvage M.D.   On: 11/11/2023 15:23    Microbiology: Results for orders placed or performed during the hospital encounter of 07/19/23  MRSA Next Gen by PCR, Nasal     Status: None   Collection Time: 07/20/23 12:12 AM   Specimen: Nasal Mucosa; Nasal Swab  Result Value Ref Range Status   MRSA by PCR Next Gen NOT DETECTED NOT DETECTED Final    Comment: (NOTE) The GeneXpert MRSA Assay (FDA approved for NASAL specimens only), is one component of a comprehensive MRSA colonization surveillance program. It is not intended to diagnose MRSA infection nor to guide or monitor treatment for MRSA infections. Test performance is not FDA approved in patients less than 45 years old. Performed at [facility], [address], [place], [place] [address]     Labs: CBC: Recent Labs  Lab 11/11/23 1428 11/12/23 0513 11/13/23 0528 11/14/23 0259  WBC 13.7* 13.6* 13.0* 13.7*  NEUTROABS 11.0*  --  8.4* 8.7*  HGB 14.8 13.2 12.6 11.4*  HCT 43.9 38.8 38.8 35.4*  MCV 89.0 90.2 93.0 93.2  PLT 234 265 246 233   Basic Metabolic Panel: Recent Labs  Lab 11/11/23 1428 11/11/23 1902 11/12/23 0513 11/13/23 0528 11/14/23 0259  NA 133* 136 136 134* 137  K 4.0 3.9  4.2 3.9 4.0  CL 98 103 99 100 105  CO2 22 23 28 27 22   GLUCOSE 540* 385* 344* 275* 165*  BUN 23 23 25* 39* 40*  CREATININE 1.06* 0.98 1.03* 1.86* 1.37*  CALCIUM  8.3* 8.6* 8.6* 8.4* 7.6*  MG  --   --   --   2.0 1.7   Liver Function Tests: Recent Labs  Lab 11/11/23 1428  AST 17  ALT 10  ALKPHOS 75  BILITOT 0.8  PROT 6.6  ALBUMIN 3.5   CBG: Recent Labs  Lab 11/14/23 1633 11/14/23 2125 11/15/23 0433 11/15/23 0731 11/15/23 1145  GLUCAP 188* 154* 176* 182* 149*    Discharge time spent: {LESS THAN/GREATER THAN:26388} 30 minutes.  Signed: Elgin Lam, MD [facility] 11/15/2023

## 2023-11-14 NOTE — Progress Notes (Signed)
 Orthopedic Tech Progress Note Patient Details:  Jennifer Ingram 1951-03-26 [id number]  Ortho Devices Type of Ortho Device: Shoulder immobilizer Ortho Device/Splint Location: right shoulder immobilizer applied Ortho Device/Splint Interventions: Ordered, Application, Adjustment   Post Interventions Patient Tolerated: Well Instructions Provided: Adjustment of device, Care of device  Waylan Thom Loving 11/14/2023, 10:26 AM

## 2023-11-15 DIAGNOSIS — R55 Syncope and collapse: Secondary | ICD-10-CM | POA: Diagnosis not present

## 2023-11-15 DIAGNOSIS — Z01810 Encounter for preprocedural cardiovascular examination: Secondary | ICD-10-CM

## 2023-11-15 DIAGNOSIS — E785 Hyperlipidemia, unspecified: Secondary | ICD-10-CM

## 2023-11-15 DIAGNOSIS — I350 Nonrheumatic aortic (valve) stenosis: Secondary | ICD-10-CM

## 2023-11-15 LAB — GLUCOSE, CAPILLARY
Glucose-Capillary: 176 mg/dL — ABNORMAL HIGH (ref 70–99)
Glucose-Capillary: 182 mg/dL — ABNORMAL HIGH (ref 70–99)
Glucose-Capillary: 149 mg/dL — ABNORMAL HIGH (ref 70–99)
Glucose-Capillary: 181 mg/dL — ABNORMAL HIGH (ref 70–99)
Glucose-Capillary: 146 mg/dL — ABNORMAL HIGH (ref 70–99)

## 2023-11-15 NOTE — Consult Note (Signed)
 Cardiology Consultation   Patient ID: Jennifer Ingram MRN: [id number]; DOB: 28-May-1950  Admit date: 11/11/2023 Date of Consult: 11/15/2023  PCP:  Patient, No Pcp Per   [facility] Providers Cardiologist:  Annabella Scarce, MD - new this admission    Patient Profile: Jennifer Ingram is a 73 y.o. female with a hx of type 2 diabetes, hypertension, hyperlipidemia, CKD stage IIIb, hypothyroidism, depression who is being seen 11/15/2023 for the evaluation of syncope at the request of Dr. Briana.  History of Present Illness: [name] is a 73 year old female with above medical history.  Per chart review, patient does not have any past cardiac history and does not follow with a cardiologist.  Patient presented to the ED on 7/13 after she had a presumed syncopal episode and a fall.  After the fall she had significant pain in her right shoulder.  Patient does not remember falling or having a syncopal episode.  Her son heard her fall and for found her on the floor, responsive and alert.  Patient has a history of dizziness/vertigo.  Previous orthostatic vital signs in 12/2022 showed significant drop in blood pressure.  Initial labs in the ED significant for glucose elevated to 540, creatinine 1.06, sodium 133.  CK 48.  High-sensitivity troponin negative.  WBC 13.1, hemoglobin 14.8.  Shoulder x-ray is significant for surgical neck fracture of right proximal humerus displaced up to 2.5 cm.  She was admitted to the internal medicine service for treatment of syncope and collapse as well as displaced fracture of surgical neck of right humerus.  Received IV fluids and PT evaluation.  Also was seen by the diabetes coordinator and orthopedics.  Orthopedic surgery was consulted and plan for outpatient ORIF, likely next week.  Echocardiogram 7/14 showed EF 60-65%, no regional wall motion abnormalities, mild LVH, mildly reduced RV systolic function, no evidence of mitral valve regurgitation or stenosis,  mild AI, low-flow/low gradient at least moderate AS.  Cardiology was consulted.  On interview today, patient reports that she is feeling poorly because she is having quite a bit of pain in her right arm.  She denies any past cardiac history.  She denies recent episodes of chest discomfort or shortness of breath.  When she fell on 7/13, she cannot recall exactly what happened.  Suspects that she did lose consciousness, cannot remember having any symptoms prior or post fall.  She has not been having any chest pain on exertion recently.  Denies dyspnea on exertion, dizziness, lightheadedness.  Denies having recent lightheadedness upon standing, did have an episode of vertigo in the past but this has not been an issue recently.  Reports that she has been in her usual state of health recently and has been eating and drinking as normal.  Her blood pressure has been a bit labile this admission but she denies recurrence of syncope.  Denies lightheadedness, near syncope, or dizziness since coming to the hospital.   Past Medical History:  Diagnosis Date   Arthritis    bilateral knees and back;   Depression    on meds, working well   Diabetes mellitus without complication (HCC) 05/31/2012   on meds   Heart palpitations    rapid   Hyperlipidemia    on meds   Hypertension    on meds    Past Surgical History:  Procedure Laterality Date   APPENDECTOMY  1968   CESAREAN SECTION  1980   CHOLECYSTECTOMY  1985   TONSILLECTOMY  1960  Scheduled Meds:  insulin  aspart  0-15 Units Subcutaneous TID WC   insulin  aspart  5 Units Subcutaneous TID WC   insulin  glargine-yfgn  15 Units Subcutaneous Daily   levothyroxine   50 mcg Oral Q0600   PARoxetine   40 mg Oral Daily   rosuvastatin   20 mg Oral Daily   sodium chloride  flush  3 mL Intravenous Q12H   Continuous Infusions:  sodium chloride  125 mL/hr at 11/15/23 0404   PRN Meds: acetaminophen  **OR** acetaminophen , HYDROcodone -acetaminophen , ketorolac ,  morphine  injection, ondansetron  **OR** ondansetron  (ZOFRAN ) IV  Allergies:   No Known Allergies  Social History:   Social History   Socioeconomic History   Marital status: Widowed    Spouse name: Not on file   Number of children: 1   Years of education: 29   Highest education level: 12th grade  Occupational History   Not on file  Tobacco Use   Smoking status: Never   Smokeless tobacco: Never  Vaping Use   Vaping status: Never Used  Substance and Sexual Activity   Alcohol use: No   Drug use: No   Sexual activity: Yes  Other Topics Concern   Not on file  Social History Narrative   Not on file   Social Drivers of Health   Financial Resource Strain: Not on file  Food Insecurity: No Food Insecurity (11/11/2023)   Hunger Vital Sign    Worried About Running Out of Food in the Last Year: Never true    Ran Out of Food in the Last Year: Never true  Transportation Needs: No Transportation Needs (11/11/2023)   PRAPARE - Administrator, Civil Service (Medical): No    Lack of Transportation (Non-Medical): No  Physical Activity: Sufficiently Active (10/10/2022)   Exercise Vital Sign    Days of Exercise per Week: 3 days    Minutes of Exercise per Session: 60 min  Stress: No Stress Concern Present (10/10/2022)   [employer] of Occupational Health - Occupational Stress Questionnaire    Feeling of Stress : Not at all  Social Connections: Moderately Integrated (11/11/2023)   Social Connection and Isolation Panel    Frequency of Communication with Friends and Family: More than three times a week    Frequency of Social Gatherings with Friends and Family: Once a week    Attends Religious Services: 1 to 4 times per year    Active Member of [employer] or Organizations: No    Attends Banker Meetings: 1 to 4 times per year    Marital Status: Widowed  Intimate Partner Violence: Not At Risk (11/11/2023)   Humiliation, Afraid, Rape, and Kick questionnaire    Fear of  Current or Ex-Partner: No    Emotionally Abused: No    Physically Abused: No    Sexually Abused: No    Family History:    Family History  Problem Relation Age of Onset   Cancer Mother        leukemia   Cancer Father        lung   Breast cancer Neg Hx    Colon polyps Neg Hx    Crohn's disease Neg Hx    Esophageal cancer Neg Hx    Stomach cancer Neg Hx    Rectal cancer Neg Hx      ROS:  Please see the history of present illness.   All other ROS reviewed and negative.     Physical Exam/Data: Vitals:   11/14/23 1144 11/14/23 2022 11/15/23 0434 11/15/23  0500  BP: 95/65 (!) 111/52 (!) 154/69   Pulse: 68 74 74   Resp: 16 16 16    Temp: 98.6 F (37 C) 98 F (36.7 C) 98.8 F (37.1 C)   TempSrc:      SpO2: 100% 94% 96%   Weight:    71.9 kg  Height:        Intake/Output Summary (Last 24 hours) at 11/15/2023 9077 Last data filed at 11/14/2023 1837 Gross per 24 hour  Intake 120 ml  Output 300 ml  Net -180 ml      11/15/2023    5:00 AM 11/14/2023    5:00 AM 11/13/2023    5:00 AM  Last 3 Weights  Weight (lbs) 158 lb 8.2 oz 151 lb 14.4 oz 147 lb 7.8 oz  Weight (kg) 71.9 kg 68.9 kg 66.9 kg     Body mass index is 30.96 kg/m.  General:  Elderly female, sitting upright in the bed. Appears uncomfortable but in no acute distress.  HEENT: normal Neck: no JVD Cardiac:  normal S1, S2; RRR; grade 2/6 systolic murmur  Lungs:  anterior lung exam clear. Patient unable to tolerate sitting upright or rotating for full lung exam due to pain  Abd: soft, nontender  Ext: no edema in BLE  Musculoskeletal:  Right upper humerus covered in bruises  Skin: warm and dry  Neuro:  CNs 2-12 intact, no focal abnormalities noted Psych:  Normal affect   EKG:  The EKG was personally reviewed and demonstrates:  Sinus rhythm, old inferior infarct, heart rate 72 bpm Telemetry:  Telemetry was personally reviewed and demonstrates:  NSR   Relevant CV Studies: Cardiac Studies & Procedures    ______________________________________________________________________________________________     ECHOCARDIOGRAM  ECHOCARDIOGRAM COMPLETE 11/12/2023  Narrative ECHOCARDIOGRAM REPORT    Patient Name:   MIKYA DON Date of Exam: 11/12/2023 Medical Rec #:  [id number]      Height:       60.0 in Accession #:    [id number]     Weight:       147.0 lb Date of Birth:  October 11, 1950      BSA:          1.638 m Patient Age:    73 years       BP:           114/67 mmHg Patient Gender: F              HR:           79 bpm. Exam Location:  Inpatient  Procedure: 2D Echo, Cardiac Doppler, Color Doppler and Intracardiac Opacification Agent (Both Spectral and Color Flow Doppler were utilized during procedure).  Indications:    Syncope  History:        Patient has no prior history of Echocardiogram examinations. Risk Factors:Hypertension, Diabetes and Dyslipidemia.  Sonographer:    Therisa Crouch Referring Phys: [id number] ALLISON WOLFE  IMPRESSIONS   1. Left ventricular ejection fraction, by estimation, is 60 to 65%. The left ventricle has normal function. The left ventricle has no regional wall motion abnormalities. There is mild left ventricular hypertrophy. Left ventricular diastolic parameters were normal. 2. Right ventricular systolic function is mildly reduced. The right ventricular size is normal. Mildly increased right ventricular wall thickness. Tricuspid regurgitation signal is inadequate for assessing PA pressure. 3. The mitral valve is degenerative. No evidence of mitral valve regurgitation. No evidence of mitral stenosis. 4. The aortic valve is tricuspid. Aortic valve regurgitation is mild. Paradoxical  low flow, low gradient atleast moderate AS (peak velocity 2.44m/s, MG , AVA (VTI) 1.07cm2, planimetry 0.89cm2, DI 0.42, SVi 26). 5. The inferior vena cava is normal in size with <50% respiratory variability, suggesting right atrial pressure of 8 mmHg.  Comparison(s): No prior  Echocardiogram.  Conclusion(s)/Recommendation(s): No left ventricular mural or apical thrombus/thrombi. Either consider aortic valve calcium  score or invasive hemodynamics to further clarify the severity of aortic stenosis.  FINDINGS Left Ventricle: Left ventricular ejection fraction, by estimation, is 60 to 65%. The left ventricle has normal function. The left ventricle has no regional wall motion abnormalities. The left ventricular internal cavity size was small. There is mild left ventricular hypertrophy. Left ventricular diastolic parameters were normal.  Right Ventricle: The right ventricular size is normal. Mildly increased right ventricular wall thickness. Right ventricular systolic function is mildly reduced. Tricuspid regurgitation signal is inadequate for assessing PA pressure.  Left Atrium: Left atrial size was normal in size.  Right Atrium: Right atrial size was normal in size.  Pericardium: There is no evidence of pericardial effusion.  Mitral Valve: The mitral valve is degenerative in appearance. There is mild thickening of the mitral valve leaflet(s). Normal mobility of the mitral valve leaflets. Mild mitral annular calcification. No evidence of mitral valve regurgitation. No evidence of mitral valve stenosis.  Tricuspid Valve: The tricuspid valve is grossly normal. Tricuspid valve regurgitation is not demonstrated. No evidence of tricuspid stenosis.  Aortic Valve: The aortic valve is tricuspid. Aortic valve regurgitation is mild. Paradoxical low flow, low gradient atleast moderate AS (peak velocity 2.83m/s, MG , AVA (VTI) 1.07cm2, planimetry 0.89cm2, DI 0.42, SVi 26). Aortic valve mean gradient measures 10.5 mmHg. Aortic valve peak gradient measures 19.4 mmHg. Aortic valve area, by VTI measures 1.07 cm.  Pulmonic Valve: The pulmonic valve was normal in structure. Pulmonic valve regurgitation is mild. No evidence of pulmonic stenosis.  Aorta: The aortic root and  ascending aorta are structurally normal, with no evidence of dilitation.  Venous: The inferior vena cava is normal in size with less than 50% respiratory variability, suggesting right atrial pressure of 8 mmHg.  IAS/Shunts: The atrial septum is grossly normal.   LEFT VENTRICLE PLAX 2D LVIDd:         3.20 cm     Diastology LVIDs:         2.00 cm     LV e' medial:    6.31 cm/s LV PW:         1.20 cm     LV E/e' medial:  10.2 LV IVS:        1.10 cm     LV e' lateral:   5.22 cm/s LVOT diam:     1.80 cm     LV E/e' lateral: 12.3 LV SV:         42 LV SV Index:   26 LVOT Area:     2.54 cm  LV Volumes (MOD) LV vol d, MOD A2C: 51.4 ml LV vol d, MOD A4C: 51.3 ml LV vol s, MOD A2C: 18.8 ml LV vol s, MOD A4C: 19.3 ml LV SV MOD A2C:     32.6 ml LV SV MOD A4C:     51.3 ml LV SV MOD BP:      33.0 ml  RIGHT VENTRICLE            IVC RV S prime:     8.49 cm/s  IVC diam: 1.90 cm TAPSE (M-mode): 1.6 cm  LEFT ATRIUM  Index LA diam:        2.90 cm 1.77 cm/m LA Vol (A2C):   34.6 ml 21.12 ml/m LA Vol (A4C):   38.3 ml 23.38 ml/m LA Biplane Vol: 38.7 ml 23.63 ml/m AORTIC VALVE AV Area (Vmax):    1.01 cm AV Area (Vmean):   0.99 cm AV Area (VTI):     1.07 cm AV Vmax:           220.00 cm/s AV Vmean:          152.500 cm/s AV VTI:            0.398 m AV Peak Grad:      19.4 mmHg AV Mean Grad:      10.5 mmHg LVOT Vmax:         87.00 cm/s LVOT Vmean:        59.600 cm/s LVOT VTI:          0.167 m LVOT/AV VTI ratio: 0.42  AORTA Ao Root diam: 3.20 cm Ao Asc diam:  3.20 cm  MITRAL VALVE MV Area (PHT): 3.07 cm    SHUNTS MV Decel Time: 247 msec    Systemic VTI:  0.17 m MV E velocity: 64.30 cm/s  Systemic Diam: 1.80 cm MV A velocity: 84.00 cm/s MV E/A ratio:  0.77  Sunit Tolia Electronically signed by Madonna Large Signature Date/Time: 11/12/2023/5:02:42 PM    Final           ______________________________________________________________________________________________       Laboratory Data: High Sensitivity Troponin:   Recent Labs  Lab 11/11/23 1902 11/11/23 2050  TROPONINIHS <2 3     Chemistry Recent Labs  Lab 11/12/23 0513 11/13/23 0528 11/14/23 0259  NA 136 134* 137  K 4.2 3.9 4.0  CL 99 100 105  CO2 28 27 22   GLUCOSE 344* 275* 165*  BUN 25* 39* 40*  CREATININE 1.03* 1.86* 1.37*  CALCIUM  8.6* 8.4* 7.6*  MG  --  2.0 1.7  GFRNONAA 58* 29* 41*  ANIONGAP 9 7 10     Recent Labs  Lab 11/11/23 1428  PROT 6.6  ALBUMIN 3.5  AST 17  ALT 10  ALKPHOS 75  BILITOT 0.8   Lipids No results for input(s): CHOL, TRIG, HDL, LABVLDL, LDLCALC, CHOLHDL in the last 168 hours.  Hematology Recent Labs  Lab 11/12/23 0513 11/13/23 0528 11/14/23 0259  WBC 13.6* 13.0* 13.7*  RBC 4.30 4.17 3.80*  HGB 13.2 12.6 11.4*  HCT 38.8 38.8 35.4*  MCV 90.2 93.0 93.2  MCH 30.7 30.2 30.0  MCHC 34.0 32.5 32.2  RDW 12.3 12.6 12.3  PLT 265 246 233   Thyroid   Recent Labs  Lab 11/11/23 1902  TSH 2.076    BNPNo results for input(s): BNP, PROBNP in the last 168 hours.  DDimer No results for input(s): DDIMER in the last 168 hours.  Radiology/Studies:  ECHOCARDIOGRAM COMPLETE Result Date: 11/12/2023    ECHOCARDIOGRAM REPORT   Patient Name:   AILEANA HODDER Date of Exam: 11/12/2023 Medical Rec #:  [id number]      Height:       60.0 in Accession #:    [id number]     Weight:       147.0 lb Date of Birth:  11/13/1950      BSA:          1.638 m Patient Age:    73 years       BP:  114/67 mmHg Patient Gender: F              HR:           79 bpm. Exam Location:  Inpatient Procedure: 2D Echo, Cardiac Doppler, Color Doppler and Intracardiac            Opacification Agent (Both Spectral and Color Flow Doppler were            utilized during procedure). Indications:    Syncope  History:        Patient has no prior history of Echocardiogram  examinations.                 Risk Factors:Hypertension, Diabetes and Dyslipidemia.  Sonographer:    Therisa Crouch Referring Phys: [id number] ALLISON WOLFE IMPRESSIONS  1. Left ventricular ejection fraction, by estimation, is 60 to 65%. The left ventricle has normal function. The left ventricle has no regional wall motion abnormalities. There is mild left ventricular hypertrophy. Left ventricular diastolic parameters were normal.  2. Right ventricular systolic function is mildly reduced. The right ventricular size is normal. Mildly increased right ventricular wall thickness. Tricuspid regurgitation signal is inadequate for assessing PA pressure.  3. The mitral valve is degenerative. No evidence of mitral valve regurgitation. No evidence of mitral stenosis.  4. The aortic valve is tricuspid. Aortic valve regurgitation is mild. Paradoxical low flow, low gradient atleast moderate AS (peak velocity 2.53m/s, MG , AVA (VTI) 1.07cm2, planimetry 0.89cm2, DI 0.42, SVi 26).  5. The inferior vena cava is normal in size with <50% respiratory variability, suggesting right atrial pressure of 8 mmHg. Comparison(s): No prior Echocardiogram. Conclusion(s)/Recommendation(s): No left ventricular mural or apical thrombus/thrombi. Either consider aortic valve calcium  score or invasive hemodynamics to further clarify the severity of aortic stenosis. FINDINGS  Left Ventricle: Left ventricular ejection fraction, by estimation, is 60 to 65%. The left ventricle has normal function. The left ventricle has no regional wall motion abnormalities. The left ventricular internal cavity size was small. There is mild left ventricular hypertrophy. Left ventricular diastolic parameters were normal. Right Ventricle: The right ventricular size is normal. Mildly increased right ventricular wall thickness. Right ventricular systolic function is mildly reduced. Tricuspid regurgitation signal is inadequate for assessing PA pressure. Left Atrium: Left atrial  size was normal in size. Right Atrium: Right atrial size was normal in size. Pericardium: There is no evidence of pericardial effusion. Mitral Valve: The mitral valve is degenerative in appearance. There is mild thickening of the mitral valve leaflet(s). Normal mobility of the mitral valve leaflets. Mild mitral annular calcification. No evidence of mitral valve regurgitation. No evidence  of mitral valve stenosis. Tricuspid Valve: The tricuspid valve is grossly normal. Tricuspid valve regurgitation is not demonstrated. No evidence of tricuspid stenosis. Aortic Valve: The aortic valve is tricuspid. Aortic valve regurgitation is mild. Paradoxical low flow, low gradient atleast moderate AS (peak velocity 2.52m/s, MG , AVA (VTI) 1.07cm2, planimetry 0.89cm2, DI 0.42, SVi 26). Aortic valve mean gradient measures 10.5 mmHg. Aortic valve peak gradient measures 19.4 mmHg. Aortic valve area, by VTI measures 1.07 cm. Pulmonic Valve: The pulmonic valve was normal in structure. Pulmonic valve regurgitation is mild. No evidence of pulmonic stenosis. Aorta: The aortic root and ascending aorta are structurally normal, with no evidence of dilitation. Venous: The inferior vena cava is normal in size with less than 50% respiratory variability, suggesting right atrial pressure of 8 mmHg. IAS/Shunts: The atrial septum is grossly normal.  LEFT VENTRICLE PLAX 2D LVIDd:  3.20 cm     Diastology LVIDs:         2.00 cm     LV e' medial:    6.31 cm/s LV PW:         1.20 cm     LV E/e' medial:  10.2 LV IVS:        1.10 cm     LV e' lateral:   5.22 cm/s LVOT diam:     1.80 cm     LV E/e' lateral: 12.3 LV SV:         42 LV SV Index:   26 LVOT Area:     2.54 cm  LV Volumes (MOD) LV vol d, MOD A2C: 51.4 ml LV vol d, MOD A4C: 51.3 ml LV vol s, MOD A2C: 18.8 ml LV vol s, MOD A4C: 19.3 ml LV SV MOD A2C:     32.6 ml LV SV MOD A4C:     51.3 ml LV SV MOD BP:      33.0 ml RIGHT VENTRICLE            IVC RV S prime:     8.49 cm/s  IVC diam:  1.90 cm TAPSE (M-mode): 1.6 cm LEFT ATRIUM             Index LA diam:        2.90 cm 1.77 cm/m LA Vol (A2C):   34.6 ml 21.12 ml/m LA Vol (A4C):   38.3 ml 23.38 ml/m LA Biplane Vol: 38.7 ml 23.63 ml/m  AORTIC VALVE AV Area (Vmax):    1.01 cm AV Area (Vmean):   0.99 cm AV Area (VTI):     1.07 cm AV Vmax:           220.00 cm/s AV Vmean:          152.500 cm/s AV VTI:            0.398 m AV Peak Grad:      19.4 mmHg AV Mean Grad:      10.5 mmHg LVOT Vmax:         87.00 cm/s LVOT Vmean:        59.600 cm/s LVOT VTI:          0.167 m LVOT/AV VTI ratio: 0.42  AORTA Ao Root diam: 3.20 cm Ao Asc diam:  3.20 cm MITRAL VALVE MV Area (PHT): 3.07 cm    SHUNTS MV Decel Time: 247 msec    Systemic VTI:  0.17 m MV E velocity: 64.30 cm/s  Systemic Diam: 1.80 cm MV A velocity: 84.00 cm/s MV E/A ratio:  0.77 Sunit Tolia Electronically signed by Madonna Large Signature Date/Time: 11/12/2023/5:02:42 PM    Final    MR BRAIN WO CONTRAST Result Date: 11/12/2023 EXAM: MRI BRAIN WITHOUT CONTRAST 11/12/2023 08:33:09 AM TECHNIQUE: Multiplanar multisequence MRI of the head/brain was performed without the administration of intravenous contrast. COMPARISON: None available. CLINICAL HISTORY: Head trauma, abnormal mental status (Age 31-64y). MR BRAIN WO; altered mental status FINDINGS: BRAIN AND VENTRICLES: No acute infarct. No intracranial hemorrhage. No mass. No midline shift. No hydrocephalus. The sella is unremarkable. Normal flow voids. Mild atrophy and right matter changes are within normal limits for age. ORBITS: Bilateral lens replacements are noted. The globes and orbits are otherwise within normal limits. SINUSES AND MASTOIDS: No acute abnormality. BONES AND SOFT TISSUES: Normal marrow signal. No acute soft tissue abnormality. IMPRESSION: 1. No acute intracranial abnormality related to head trauma or altered mental status. Normal MRI  of the brain for age. Electronically signed by: Lonni Necessary MD 11/12/2023 08:47 AM EDT RP  Workstation: [id number]   CT CHEST ABDOMEN PELVIS W CONTRAST Result Date: 11/11/2023 CLINICAL DATA:  Syncope, right shoulder deformity. Earlier imaging today revealed a surgical neck fracture the right proximal humerus. EXAM: CT CHEST, ABDOMEN, AND PELVIS WITH CONTRAST TECHNIQUE: Multidetector CT imaging of the chest, abdomen and pelvis was performed following the standard protocol during bolus administration of intravenous contrast. RADIATION DOSE REDUCTION: This exam was performed according to the departmental dose-optimization program which includes automated exposure control, adjustment of the mA and/or kV according to patient size and/or use of iterative reconstruction technique. CONTRAST:  80mL OMNIPAQUE  IOHEXOL  300 MG/ML  SOLN COMPARISON:  Radiographs from 11/11/2023 and CT abdomen from 07/21/2023 FINDINGS: CT CHEST FINDINGS Cardiovascular: Atheromatous vascular calcification the aortic arch. Mild aortic valve calcification. Mediastinum/Nodes: Moderate-sized hiatal hernia. Lungs/Pleura: Biapical pleuroparenchymal scarring. Dependent subsegmental atelectasis in both lower lobes. Mild scarring in the vicinity of the right minor fissure. Musculoskeletal: Mild lower thoracic spondylosis. Neer 2 part surgical neck fracture of the right proximal humerus displaced 1.7 cm CT ABDOMEN PELVIS FINDINGS Hepatobiliary: Cholecystectomy. Stable mild prominence of the common bile duct at 1.1 cm, likely a physiologic response to cholecystectomy. Pancreas: Unremarkable Spleen: Unremarkable Adrenals/Urinary Tract: Unremarkable Stomach/Bowel: Unremarkable Vascular/Lymphatic: Atherosclerosis is present, including aortoiliac atherosclerotic disease. Reproductive: Unremarkable Other: No supplemental non-categorized findings. Musculoskeletal: Degenerative facet arthropathy bilaterally at L4-5 with grade 1 degenerative anterolisthesis of L4 on L5. Moderate right foraminal stenosis at L4-5 due to disc bulge, facet arthropathy, and  disc uncovering. IMPRESSION: 1. Neer 2-part surgical neck fracture of the right proximal humerus displaced 1.7 cm. 2. No additional acute findings in the chest, abdomen, or pelvis. 3. Moderate-sized hiatal hernia. 4. Degenerative facet arthropathy bilaterally at L4-5 with grade 1 degenerative anterolisthesis of L4 on L5. Moderate right foraminal stenosis at L4-5 due to disc bulge, facet arthropathy, and disc uncovering. 5.  Aortic Atherosclerosis (ICD10-I70.0). Electronically Signed   By: Ryan Salvage M.D.   On: 11/11/2023 17:18   CT Head Wo Contrast Result Date: 11/11/2023 EXAM: CT HEAD AND CERVICAL SPINE 11/11/2023 05:01:25 PM TECHNIQUE: CT of the head and cervical spine was performed without the administration of intravenous contrast. Multiplanar reformatted images are provided for review. Automated exposure control, iterative reconstruction, and/or weight based adjustment of the mA/kV was utilized to reduce the radiation dose to as low as reasonably achievable. COMPARISON: CT head 01/04/2023 CLINICAL HISTORY: Head trauma, minor (Age >= 65y). Pt bib gcems for syncopal episode this morning. Son called 911. Patietn doesn't remember falling or having syncopal episode. Obvious deformity to right shoulder. FINDINGS: CT HEAD BRAIN AND VENTRICLES: No acute intracranial hemorrhage. No mass effect or midline shift. No abnormal extra-axial fluid collection. Gray-white differentiation is maintained. No hydrocephalus. ORBITS: No acute abnormality. SINUSES AND MASTOIDS: No acute abnormality. SOFT TISSUES AND SKULL: No acute skull fracture. No acute soft tissue abnormality. CT CERVICAL SPINE BONES AND ALIGNMENT: No acute fracture or traumatic malalignment. 3 mm degenerative anterolisthesis of C4 on C5. DEGENERATIVE CHANGES: Degenerative endplate changes at C5-6. Left greater than right facet arthropathy at C4-5. SOFT TISSUES: No prevertebral soft tissue swelling. VASCULATURE: Atherosclerotic calcifications of the  carotid bulbs. IMPRESSION: 1. No acute intracranial abnormality. 2. No acute fracture or traumatic malalignment of the cervical spine. Electronically signed by: Ryan Chess MD 11/11/2023 05:14 PM EDT RP Workstation: [id number]   CT Cervical Spine Wo Contrast Result Date: 11/11/2023 EXAM: CT HEAD AND CERVICAL SPINE 11/11/2023 05:01:25  PM TECHNIQUE: CT of the head and cervical spine was performed without the administration of intravenous contrast. Multiplanar reformatted images are provided for review. Automated exposure control, iterative reconstruction, and/or weight based adjustment of the mA/kV was utilized to reduce the radiation dose to as low as reasonably achievable. COMPARISON: CT head 01/04/2023 CLINICAL HISTORY: Head trauma, minor (Age >= 65y). Pt bib gcems for syncopal episode this morning. Son called 911. Patietn doesn't remember falling or having syncopal episode. Obvious deformity to right shoulder. FINDINGS: CT HEAD BRAIN AND VENTRICLES: No acute intracranial hemorrhage. No mass effect or midline shift. No abnormal extra-axial fluid collection. Gray-white differentiation is maintained. No hydrocephalus. ORBITS: No acute abnormality. SINUSES AND MASTOIDS: No acute abnormality. SOFT TISSUES AND SKULL: No acute skull fracture. No acute soft tissue abnormality. CT CERVICAL SPINE BONES AND ALIGNMENT: No acute fracture or traumatic malalignment. 3 mm degenerative anterolisthesis of C4 on C5. DEGENERATIVE CHANGES: Degenerative endplate changes at C5-6. Left greater than right facet arthropathy at C4-5. SOFT TISSUES: No prevertebral soft tissue swelling. VASCULATURE: Atherosclerotic calcifications of the carotid bulbs. IMPRESSION: 1. No acute intracranial abnormality. 2. No acute fracture or traumatic malalignment of the cervical spine. Electronically signed by: Ryan Chess MD 11/11/2023 05:14 PM EDT RP Workstation: [id number]   DG Chest Portable 1 View Result Date: 11/11/2023 CLINICAL DATA:  Right  proximal humeral fracture, fall EXAM: PORTABLE CHEST 1 VIEW COMPARISON:  12/28/2022 FINDINGS: The displaced right proximal humeral surgical neck fracture is observed. The lungs appear clear. Cardiac and mediastinal contours normal. No blunting of the costophrenic angles. Mild lower thoracic spondylosis. IMPRESSION: 1. Displaced right proximal humeral surgical neck fracture. 2. No acute cardiopulmonary findings. 3. Mild lower thoracic spondylosis. Electronically Signed   By: Ryan Salvage M.D.   On: 11/11/2023 15:28   DG Pelvis Portable Result Date: 11/11/2023 CLINICAL DATA:  Fall, right proximal humeral fracture EXAM: PORTABLE PELVIS 1-2 VIEWS COMPARISON:  CT pelvis 07/21/2023 FINDINGS: Chronic degenerative disc disease at L4-5. No pelvic fracture or acute bony finding identified. IMPRESSION: 1. No pelvic fracture or acute bony finding identified. 2. Chronic degenerative disc disease at L4-5. Electronically Signed   By: Ryan Salvage M.D.   On: 11/11/2023 15:26   DG Elbow 2 Views Right Result Date: 11/11/2023 CLINICAL DATA:  Deformity, fall EXAM: RIGHT ELBOW - 2 VIEW COMPARISON:  None Available. FINDINGS: Displaced surgical neck fracture the right proximal humerus. The patient has very limited ability to move the elbow due to the proximal humeral fracture. Both views are very limited and oblique. No obvious/gross elbow fracture observed. Indeterminate for elbow effusion. IMPRESSION: 1. Displaced surgical neck fracture of the right proximal humerus. 2. Very limited ability to move the elbow due to the proximal humeral fracture. No obvious/gross elbow fracture observed. Indeterminate for elbow effusion, adversely affected negative predictive value due to positioning. Electronically Signed   By: Ryan Salvage M.D.   On: 11/11/2023 15:25   DG Humerus Right Result Date: 11/11/2023 CLINICAL DATA:  Fall, shoulder deformity EXAM: RIGHT HUMERUS - 2+ VIEW COMPARISON:  None Available. FINDINGS: Displaced  acute surgical neck fracture the right proximal humerus. No other fracture is identified. IMPRESSION: 1. Displaced acute surgical neck fracture of the right proximal humerus. Electronically Signed   By: Ryan Salvage M.D.   On: 11/11/2023 15:24   DG Shoulder Right Portable Result Date: 11/11/2023 CLINICAL DATA:  Fall, shoulder deformity EXAM: RIGHT SHOULDER - 1 VIEW COMPARISON:  None Available. FINDINGS: Surgical neck fracture proximal humerus displaced up to 2.5 cm.  No other fracture is observed. IMPRESSION: 1. Surgical neck fracture right proximal humerus displaced up to 2.5 cm. Electronically Signed   By: Ryan Salvage M.D.   On: 11/11/2023 15:23     Assessment and Plan:  Syncope  Labile BP - Patient does not recall any details of the event.  On 7/13 she was in her kitchen and her son heard her fall.  She believes she may have lost consciousness but is not sure.  Cannot recall if she had any symptoms prior or post fall - Prior to her fall, patient reports she was in her usual state of health.  Has been eating and drinking normally.  No recent illness.  She has a history of vertigo and orthostatic hypotension but neither of these had been an issue recently - Echocardiogram this admission with EF 60-65%, no wall motion abnormalities, mildly reduced RV systolic function, no evidence of mitral valve disease.  There is mild AI, paradoxical low-flow low gradient moderate AS - Patient had AKI this mission with creatinine peaking at 1.86.  Improved with IV fluids.  Possible that patient was somewhat dehydrated contributing to her syncopal event - BP has been somewhat labile this admission, as high as 154/69 today, but down to 95/65 yesterday at noon.  Currently, her home atenolol  is held.  Will need to keep BP log postdischarge - Telemetry shows normal sinus rhythm.  Plan for 14-day monitor at discharge  Aortic Stenosis  - Echocardiogram this admission showed paradoxical low flow, low  gradient at least moderate AS  - Patient did have 1 recent possible syncopal event.  Denies having chest pain or shortness of breath - Plan to monitor as an outpatient with serial echos given patient's lack of symptoms. If she develops worsening AS or symptoms, can refer to structural heart  Preoperative Eval  - Patient pending ortho surgery to repair R humerus fracture. Likely next week  - While patient does have low flow low gradient moderate AS on echo, she overall is asymptomatic. This does increase her surgical risk, but she is overall stable from a cardiac standpoint  - Do not anticipate further cardiac workup prior to surgery. MD to see and confirm   Otherwise per primary  - Poorly controlled diabetes-hemoglobin A1c>15.5%.  Blood glucose was 540 in ED - AKI on CKD stage IIIb-creatinine peaked at 1.86, improving with IV fluids - Hypothyroidism - Anxiety, depression  Risk Assessment/Risk Scores:   For questions or updates, please contact [facility] Please consult [email or web address] for contact info under    Signed, Rollo FABIENE Louder, PA-C  11/15/2023 9:22 AM

## 2023-11-15 NOTE — Plan of Care (Signed)
  Problem: Education: Goal: Knowledge of condition and prescribed therapy will improve Outcome: Progressing   Problem: Cardiac: Goal: Will achieve and/or maintain adequate cardiac output Outcome: Progressing   Problem: Physical Regulation: Goal: Complications related to the disease process, condition or treatment will be avoided or minimized Outcome: Progressing   Problem: Education: Goal: Knowledge of General Education information will improve Description: Including pain rating scale, medication(s)/side effects and non-pharmacologic comfort measures Outcome: Progressing   Problem: Health Behavior/Discharge Planning: Goal: Ability to manage health-related needs will improve Outcome: Progressing   Problem: Clinical Measurements: Goal: Ability to maintain clinical measurements within normal limits will improve Outcome: Progressing Goal: Will remain free from infection Outcome: Progressing Goal: Diagnostic test results will improve Outcome: Progressing Goal: Respiratory complications will improve Outcome: Progressing Goal: Cardiovascular complication will be avoided Outcome: Progressing   Problem: Activity: Goal: Risk for activity intolerance will decrease Outcome: Progressing   Problem: Nutrition: Goal: Adequate nutrition will be maintained Outcome: Progressing   Problem: Coping: Goal: Level of anxiety will decrease Outcome: Progressing   Problem: Elimination: Goal: Will not experience complications related to bowel motility Outcome: Progressing Goal: Will not experience complications related to urinary retention Outcome: Progressing

## 2023-11-15 NOTE — Plan of Care (Signed)
  Problem: Health Behavior/Discharge Planning: Goal: Ability to manage health-related needs will improve Outcome: Progressing   Problem: Activity: Goal: Risk for activity intolerance will decrease Outcome: Progressing   Problem: Coping: Goal: Level of anxiety will decrease Outcome: Progressing   Problem: Pain Managment: Goal: General experience of comfort will improve and/or be controlled Outcome: Progressing   Problem: Safety: Goal: Ability to remain free from injury will improve Outcome: Progressing

## 2023-11-15 NOTE — Progress Notes (Addendum)
 PROGRESS NOTE    Jennifer Ingram  FMW:[id number] DOB: 12/20/1950 DOA: 11/11/2023 PCP: Patient, No Pcp Per   Brief Narrative: Jennifer Ingram is a 73 y.o. female with a history of diabetes mellitus type 2, hypertension, hyperlipidemia, CKD stage IIIb, hypothyroidism, depression.  Patient presented secondary to syncope with resultant right shoulder pain and found to have acute humeral fracture.  Orthopedic surgery recommendations for outpatient follow-up and eventual ORIF.  Hospitalization complicated by AKI, treated with IV fluids.  Echocardiogram significant for moderate aortic stenosis with patient also having low normal blood pressures possibly contributing to syncopal episode.  Cardiology consulted.   Assessment and Plan:  Syncope and collapse Present on admission.  Unclear etiology.  Patient monitored on telemetry.  Syncope workup significant for no acute CT head or MRI brain findings.  Echocardiogram significant for at least moderate aortic stenosis with normal LV function.  Patient did have hypotension during admission which could be contributory to syncopal episode.  Patient's home atenolol  was held. - Cardiology recommendations: Plan for outpatient heart monitor, outpatient TAVR evaluation after management of humerus fracture - Continue telemetry   Displaced right proximal humerus fracture Secondary to fall. X-ray significant for a displaced acute surgical neck fracture of the right proximal humerus. Orthopedic surgery consulted with recommendation for sling, non-weight bearing and outpatient follow-up with Dr. Selinda Gosling with eventual plan for ORIF. Cardiology recommendation to proceed with surgical management but to avoid hypotension. - Continue Norco as needed   Diabetes mellitus type 2 Poorly controlled based off last hemoglobin A1c of greater than 15.5%.  Patient is listed as taking Humulin  70/30 as an outpatient.  Patient started on Semglee , NovoLog  with meals and SSI this  admission. - Continue Semglee  15 units daily - Continue NovoLog  5 units 3 times daily with meals - Continue SSI   Moderate aortic stenosis Noted on echocardiogram. Unclear if this is contributory to presentation. - Cardiology recommendations: plan for outpatient TAVR evaluation   Leukocytosis Mild on admission. Likely reactive secondary to acute fracture. Stable. No signs or symptoms of infection.   AKI on CKD stage IIIb Baseline creatinine of about 1.0 - 1.2. Creatinine of 1.06 on admission with worsening to a peak of 1.86. Possibly secondary to hypotension. Improved with IV fluids. Repeat BMP in 3-5 days.   Primary hypertension Patient with some hypotension during admission. Might have contributed to presentation. Hold atenolol  on discharge.   Hyperlipidemia - Continue Crestor .   Hypothyroidism - Continue Euthyrox    Anxiety Depression - Continue Paroxetine .   DVT prophylaxis: SCDs Code Status:   Code Status: Full Code Family Communication: None at bedside Disposition Plan: Discharge to SNF likely in 24 hours pending cardiology recommendations   Consultants:  Cardiology Orthopedic surgery  Procedures:  Transthoracic Echocardiogram  Antimicrobials: None    Subjective: Continued trouble with managing now that she cannot use her right arm. No other issues or concerns.  Objective: BP 134/62 (BP Location: Left Arm)   Pulse 73   Temp 98.1 F (36.7 C)   Resp 17   Ht 5' (1.524 m)   Wt 71.9 kg   SpO2 99%   BMI 30.96 kg/m   Examination:  General exam: Appears calm and comfortable Respiratory system: Clear to auscultation. Respiratory effort normal. Cardiovascular system: S1 & S2 heard, RRR. 2/6 systolic murmur Gastrointestinal system: Abdomen is nondistended, soft and nontender. Central nervous system: Alert and oriented. No focal neurological deficits. Musculoskeletal: No edema. No calf tenderness. Right arm in sling. Psychiatry: Judgement and  insight  appear normal. Mood & affect appropriate.    Data Reviewed: I have personally reviewed following labs and imaging studies  CBC Lab Results  Component Value Date   WBC 13.7 (H) 11/14/2023   RBC 3.80 (L) 11/14/2023   HGB 11.4 (L) 11/14/2023   HCT 35.4 (L) 11/14/2023   MCV 93.2 11/14/2023   MCH 30.0 11/14/2023   PLT 233 11/14/2023   MCHC 32.2 11/14/2023   RDW 12.3 11/14/2023   LYMPHSABS 3.1 11/14/2023   MONOABS 1.1 (H) 11/14/2023   EOSABS 0.7 (H) 11/14/2023   BASOSABS 0.0 11/14/2023     Last metabolic panel Lab Results  Component Value Date   NA 137 11/14/2023   K 4.0 11/14/2023   CL 105 11/14/2023   CO2 22 11/14/2023   BUN 40 (H) 11/14/2023   CREATININE 1.37 (H) 11/14/2023   GLUCOSE 165 (H) 11/14/2023   GFRNONAA 41 (L) 11/14/2023   GFRAA >60 11/06/2016   CALCIUM  7.6 (L) 11/14/2023   PROT 6.6 11/11/2023   ALBUMIN 3.5 11/11/2023   BILITOT 0.8 11/11/2023   ALKPHOS 75 11/11/2023   AST 17 11/11/2023   ALT 10 11/11/2023   ANIONGAP 10 11/14/2023    GFR: Estimated Creatinine Clearance: 33.4 mL/min (A) (by C-G formula based on SCr of 1.37 mg/dL (H)).  No results found for this or any previous visit (from the past 240 hours).    Radiology Studies: No results found.     LOS: 4 days    Elgin Lam, MD [facility] 11/15/2023, 2:18 PM   If 7PM-7AM, please contact night-coverage [email or web address]

## 2023-11-16 ENCOUNTER — Other Ambulatory Visit

## 2023-11-16 ENCOUNTER — Encounter: Payer: Self-pay | Admitting: *Deleted

## 2023-11-16 ENCOUNTER — Other Ambulatory Visit: Payer: Self-pay | Admitting: Cardiology

## 2023-11-16 DIAGNOSIS — I350 Nonrheumatic aortic (valve) stenosis: Secondary | ICD-10-CM | POA: Diagnosis not present

## 2023-11-16 DIAGNOSIS — Z794 Long term (current) use of insulin: Secondary | ICD-10-CM | POA: Diagnosis not present

## 2023-11-16 DIAGNOSIS — R55 Syncope and collapse: Secondary | ICD-10-CM | POA: Diagnosis not present

## 2023-11-16 DIAGNOSIS — M6281 Muscle weakness (generalized): Secondary | ICD-10-CM | POA: Diagnosis not present

## 2023-11-16 DIAGNOSIS — E039 Hypothyroidism, unspecified: Secondary | ICD-10-CM | POA: Diagnosis not present

## 2023-11-16 DIAGNOSIS — N1832 Chronic kidney disease, stage 3b: Secondary | ICD-10-CM | POA: Diagnosis not present

## 2023-11-16 DIAGNOSIS — R1311 Dysphagia, oral phase: Secondary | ICD-10-CM | POA: Diagnosis not present

## 2023-11-16 DIAGNOSIS — S42201A Unspecified fracture of upper end of right humerus, initial encounter for closed fracture: Secondary | ICD-10-CM | POA: Diagnosis present

## 2023-11-16 DIAGNOSIS — R2681 Unsteadiness on feet: Secondary | ICD-10-CM | POA: Diagnosis not present

## 2023-11-16 DIAGNOSIS — E119 Type 2 diabetes mellitus without complications: Secondary | ICD-10-CM | POA: Diagnosis not present

## 2023-11-16 DIAGNOSIS — W19XXXA Unspecified fall, initial encounter: Secondary | ICD-10-CM | POA: Diagnosis not present

## 2023-11-16 DIAGNOSIS — Z79899 Other long term (current) drug therapy: Secondary | ICD-10-CM | POA: Diagnosis not present

## 2023-11-16 DIAGNOSIS — E785 Hyperlipidemia, unspecified: Secondary | ICD-10-CM | POA: Diagnosis not present

## 2023-11-16 DIAGNOSIS — M25511 Pain in right shoulder: Secondary | ICD-10-CM | POA: Diagnosis not present

## 2023-11-16 DIAGNOSIS — I129 Hypertensive chronic kidney disease with stage 1 through stage 4 chronic kidney disease, or unspecified chronic kidney disease: Secondary | ICD-10-CM | POA: Diagnosis not present

## 2023-11-16 DIAGNOSIS — I10 Essential (primary) hypertension: Secondary | ICD-10-CM | POA: Diagnosis not present

## 2023-11-16 DIAGNOSIS — S42221D 2-part displaced fracture of surgical neck of right humerus, subsequent encounter for fracture with routine healing: Secondary | ICD-10-CM | POA: Diagnosis not present

## 2023-11-16 LAB — GLUCOSE, CAPILLARY
Glucose-Capillary: 114 mg/dL — ABNORMAL HIGH (ref 70–99)
Glucose-Capillary: 131 mg/dL — ABNORMAL HIGH (ref 70–99)

## 2023-11-16 MED ORDER — HYDROCODONE-ACETAMINOPHEN 5-325 MG PO TABS: 1.0000 | ORAL_TABLET | Freq: Four times a day (QID) | ORAL | 0 refills | Status: DC | PRN

## 2023-11-16 NOTE — Progress Notes (Unsigned)
 Enrolled for [employer] to mail a [employer] AT Live Telemetry monitor to patients address on file.   Dr. Duke Salvia to read.

## 2023-11-16 NOTE — TOC Transition Note (Signed)
 Transition of Care [facility]) - Discharge Note   Patient Details  Name: Jennifer Ingram MRN: [id number] Date of Birth: Dec 27, 1950  Transition of Care [facility]) CM/SW Contact:  Sheri ONEIDA Sharps, LCSW Phone Number: 11/16/2023, 10:52 AM   Clinical Narrative:    Pt medically ready to dc to [facility]. Call report [phone] room 907p given to RN. DC packet left at nurses station. PTA called at 10:25am. No further TOC needs.   Final next level of care: Skilled Nursing Facility Barriers to Discharge: Barriers Resolved   Patient Goals and CMS Choice Patient states their goals for this hospitalization and ongoing recovery are:: return home following STR CMS Medicare.gov Compare Post Acute Care list provided to:: Patient Choice offered to / list presented to : Patient [place] ownership interest in [facility].provided to:: Patient    Discharge Placement PASRR number recieved: 11/12/23            Patient [facility] bed at: [facility] Patient to be transferred to facility by: [facility] Name of family member notified: Akshitha, [place] (Son)  [phone] Patient and family notified of of transfer: 11/16/23  Discharge Plan and Services Additional resources added to the After Visit Summary for   In-house Referral: NA Discharge Planning Services: NA Post Acute Care Choice: Skilled Nursing Facility          DME Arranged: N/A DME Agency: NA       HH Arranged: NA HH Agency: NA        Social Drivers of Health (SDOH) Interventions SDOH Screenings   Food Insecurity: No Food Insecurity (11/11/2023)  Housing: Low Risk  (11/11/2023)  Transportation Needs: No Transportation Needs (11/11/2023)  Utilities: Not At Risk (11/11/2023)  Alcohol Screen: Low Risk  (10/10/2022)  Depression (PHQ2-9): Low Risk  (10/10/2022)  Physical Activity: Sufficiently Active (10/10/2022)  Social Connections: Moderately Integrated (11/11/2023)  Stress: No Stress Concern Present (10/10/2022)  Tobacco Use: Low Risk   (11/11/2023)     Readmission Risk Interventions    11/16/2023   10:50 AM 11/12/2023    4:30 PM 07/21/2023    9:11 AM  Readmission Risk Prevention Plan  Post Dischage Appt  Complete   Medication Screening  Complete   Transportation Screening Complete Complete Complete  PCP or Specialist Appt within 5-7 Days Complete  Complete  Home Care Screening Complete  Complete  Medication Review (RN CM) Complete  Complete

## 2023-11-19 DIAGNOSIS — E1142 Type 2 diabetes mellitus with diabetic polyneuropathy: Secondary | ICD-10-CM | POA: Diagnosis not present

## 2023-11-19 DIAGNOSIS — S42221D 2-part displaced fracture of surgical neck of right humerus, subsequent encounter for fracture with routine healing: Secondary | ICD-10-CM | POA: Diagnosis not present

## 2023-11-19 DIAGNOSIS — N1832 Chronic kidney disease, stage 3b: Secondary | ICD-10-CM | POA: Diagnosis not present

## 2023-11-19 DIAGNOSIS — M6281 Muscle weakness (generalized): Secondary | ICD-10-CM | POA: Diagnosis not present

## 2023-11-19 DIAGNOSIS — S42201D Unspecified fracture of upper end of right humerus, subsequent encounter for fracture with routine healing: Secondary | ICD-10-CM | POA: Diagnosis not present

## 2023-11-19 DIAGNOSIS — Z9181 History of falling: Secondary | ICD-10-CM | POA: Diagnosis not present

## 2023-11-19 DIAGNOSIS — E039 Hypothyroidism, unspecified: Secondary | ICD-10-CM | POA: Diagnosis not present

## 2023-11-19 DIAGNOSIS — I129 Hypertensive chronic kidney disease with stage 1 through stage 4 chronic kidney disease, or unspecified chronic kidney disease: Secondary | ICD-10-CM | POA: Diagnosis not present

## 2023-11-19 DIAGNOSIS — E785 Hyperlipidemia, unspecified: Secondary | ICD-10-CM | POA: Diagnosis not present

## 2023-11-19 DIAGNOSIS — R2681 Unsteadiness on feet: Secondary | ICD-10-CM | POA: Diagnosis not present

## 2023-11-19 DIAGNOSIS — D72829 Elevated white blood cell count, unspecified: Secondary | ICD-10-CM | POA: Diagnosis not present

## 2023-11-19 DIAGNOSIS — E1122 Type 2 diabetes mellitus with diabetic chronic kidney disease: Secondary | ICD-10-CM | POA: Diagnosis not present

## 2023-11-19 DIAGNOSIS — R55 Syncope and collapse: Secondary | ICD-10-CM | POA: Diagnosis not present

## 2023-11-19 DIAGNOSIS — I350 Nonrheumatic aortic (valve) stenosis: Secondary | ICD-10-CM | POA: Diagnosis not present

## 2023-11-19 DIAGNOSIS — I951 Orthostatic hypotension: Secondary | ICD-10-CM | POA: Diagnosis not present

## 2023-11-20 NOTE — Progress Notes (Deleted)
  Cardiology Office Note   Date:  11/20/2023  ID:  Jennifer Ingram, Jennifer Ingram 08-Oct-1950, MRN [id number] PCP: Patient, No Pcp Per  [facility] Providers Cardiologist:  Annabella Scarce, MD  History of Present Illness Jennifer Ingram is a 73 y.o. female with a past medical history of type 2 DM, aortic stenosis, HTN, HLD, CKD stage IIIb, hypothyroidism, depression, recent syncopal event. Patient is followed by Dr. Scarce and presents today for a hospital follow up appointment   Patient had presented to the ED on 7/13 after she had a presumed syncopal episode and fall. After the fall, she had significant pain in her right shoulder. Found to have a surgical neck fracture of the right humerus. In the ED, found to have glucose 540. Troponin negative. Treated with IV fluids. Echocardiogram 7/14 showed EF 60-65%, no regional wall motion abnormalities, mild LVH, mildly reduced RV systolic function, no evidence of mitral valve regurgitation or stenosis, mild AI, low-flow/low gradient at least moderate AS. Cardiology was consulted. Her BP was very labile during her admission and her atenolol  was held. Dr. Shirly reviewed echo, felt aortic stenosis was overall moderate-severe. Recommended avoiding hypotension, outpatient TAVR workup. If surgical risk was acceptable to anesthesia and the patient, it was felt to be OK to proceed with surgery.   Syncope  Labile BP  - Patient recently admitted after a syncopal episode and fall on 7/13. Had labile BP during her admission. Also had poorly controlled diabetes and AKI. Echocardiogram showed moderate-severe AS  - Atenolol  held for labile BP  - Orthostatic vital signs were negative, but patient did become symptomatic upon standing  - Cardiac monitor pending   Moderate-severe AS  - Noted on echocardiogram in 10/2023  - Pending outpatient TAVR eval-   HLD  - Lipid panel from 06/2023 showed LDL 100, HDL 33, triglycerides 196, total cholesterol 172  - continue  crestor  20 mg daily   Type 2 DM  - A1c >15.5% - Followed by PDP  - On insulin    ROS: ***  Studies Reviewed      *** Risk Assessment/Calculations {Does this patient have ATRIAL FIBRILLATION?[phone]} No BP recorded.  {Refresh Note OR Click here to enter BP  :1}***       Physical Exam VS:  There were no vitals taken for this visit.       Wt Readings from Last 3 Encounters:  11/16/23 152 lb 8.9 oz (69.2 kg)  09/14/23 136 lb 6.4 oz (61.9 kg)  07/21/23 139 lb 8.8 oz (63.3 kg)    GEN: Well nourished, well developed in no acute distress NECK: No JVD; No carotid bruits CARDIAC: ***RRR, no murmurs, rubs, gallops RESPIRATORY:  Clear to auscultation without rales, wheezing or rhonchi  ABDOMEN: Soft, non-tender, non-distended EXTREMITIES:  No edema; No deformity   ASSESSMENT AND PLAN ***    {Are you ordering a CV Procedure (e.g. stress test, cath, DCCV, TEE, etc)?   Press F2        :[id number]}  Dispo: ***  Signed, Rollo FABIENE Louder, PA-C

## 2023-11-21 DIAGNOSIS — I951 Orthostatic hypotension: Secondary | ICD-10-CM | POA: Diagnosis not present

## 2023-11-21 DIAGNOSIS — R2681 Unsteadiness on feet: Secondary | ICD-10-CM | POA: Diagnosis not present

## 2023-11-21 DIAGNOSIS — M6281 Muscle weakness (generalized): Secondary | ICD-10-CM | POA: Diagnosis not present

## 2023-11-21 DIAGNOSIS — Z9181 History of falling: Secondary | ICD-10-CM | POA: Diagnosis not present

## 2023-11-21 DIAGNOSIS — S42221D 2-part displaced fracture of surgical neck of right humerus, subsequent encounter for fracture with routine healing: Secondary | ICD-10-CM | POA: Diagnosis not present

## 2023-11-22 DIAGNOSIS — M25511 Pain in right shoulder: Secondary | ICD-10-CM | POA: Diagnosis not present

## 2023-11-23 ENCOUNTER — Telehealth: Payer: Self-pay | Admitting: Cardiology

## 2023-11-23 NOTE — Telephone Encounter (Signed)
 Abn monitor results

## 2023-11-23 NOTE — Telephone Encounter (Signed)
 Call from [place] at [employer]. She reports patient was ordered a [employer] AT monitor for live monitoring and it was mailed to her on 11/16/23. She reports it does not appear to have been turned on. Patient appears to have been in hospital for humerus fracture, has ORIF scheduled for 11/30/23. Order placed by Rollo Louder PA-C, with whom patient has hospital follow up visit scheduled 11/29/23. Order states Dr. Raford is to read.  Called # listed on DPR for patient to see if patient needs assistance to apply monitor, no answer and unable to leave message. Also called son, no answer.

## 2023-11-26 DIAGNOSIS — M6281 Muscle weakness (generalized): Secondary | ICD-10-CM | POA: Diagnosis not present

## 2023-11-26 DIAGNOSIS — S42221D 2-part displaced fracture of surgical neck of right humerus, subsequent encounter for fracture with routine healing: Secondary | ICD-10-CM | POA: Diagnosis not present

## 2023-11-26 DIAGNOSIS — I951 Orthostatic hypotension: Secondary | ICD-10-CM | POA: Diagnosis not present

## 2023-11-26 DIAGNOSIS — R2681 Unsteadiness on feet: Secondary | ICD-10-CM | POA: Diagnosis not present

## 2023-11-26 DIAGNOSIS — Z9181 History of falling: Secondary | ICD-10-CM | POA: Diagnosis not present

## 2023-11-28 ENCOUNTER — Encounter (HOSPITAL_COMMUNITY): Payer: Self-pay | Admitting: Orthopedic Surgery

## 2023-11-28 ENCOUNTER — Other Ambulatory Visit: Payer: Self-pay

## 2023-11-28 DIAGNOSIS — S42221D 2-part displaced fracture of surgical neck of right humerus, subsequent encounter for fracture with routine healing: Secondary | ICD-10-CM | POA: Diagnosis not present

## 2023-11-28 DIAGNOSIS — Z9181 History of falling: Secondary | ICD-10-CM | POA: Diagnosis not present

## 2023-11-28 DIAGNOSIS — R2681 Unsteadiness on feet: Secondary | ICD-10-CM | POA: Diagnosis not present

## 2023-11-28 DIAGNOSIS — I951 Orthostatic hypotension: Secondary | ICD-10-CM | POA: Diagnosis not present

## 2023-11-28 DIAGNOSIS — M6281 Muscle weakness (generalized): Secondary | ICD-10-CM | POA: Diagnosis not present

## 2023-11-28 NOTE — Progress Notes (Signed)
 Patient is currently at [facility] for Rehab, Room 907.  Spoke with patient's nurse, Levon Ao, LPN.  Will Fax information and instructions for DOS to [facility] at [phone].  Levon will call if any questions or concerns about instructions for DOS.  Obtained all patient history/information from the patient.  Patient was also given information and instructions for DOS.    PCP - Rockie Medley, DO Cardiologist - Dr Annabella Scarce (clearance on 11/29/23)  Chest x-ray - 11/11/23 EKG - 11/11/23 Stress Test - n/a ECHO - 11/12/23 Cardiac Cath - n/a  ICD Pacemaker/Loop - n/a  Sleep Study -  n/a  Diabetes Type 2 FreeStyle Libre 3, currently not using.FreeStyle System Fasting Blood Sugar -  unknown Checks Blood Sugar several times a day  THE NIGHT BEFORE SURGERY, take 7 Units Humulin  70/30 Insulin .       THE MORNING OF SURGERY, do not take Humulin  Insulin .    Aspirin & Blood Thinner Instructions:  n/a.  ERAS - Clear liquids til 12:30 PM DOS.  Anesthesia review: Yes  STOP now taking any Aspirin (unless otherwise instructed by your surgeon), Aleve, Naproxen, Ibuprofen, Motrin, Advil, Goody's, BC's, all herbal medications, fish oil, and all vitamins.   Coronavirus Screening Do you have any of the following symptoms:  Cough yes/no: No Fever (>100.52F)  yes/no: No Runny nose yes/no: No Sore throat yes/no: No Difficulty breathing/shortness of breath  yes/no: No  Have you traveled in the last 14 days and where? yes/no: No  Patient verbalized understanding of instructions that were given to them at the PAT appointment. Patient was also instructed that they will need to review over the PAT instructions again at home before surgery.

## 2023-11-28 NOTE — Progress Notes (Signed)
 Surgical Instructions for Jennifer Ingram, Room 907    Your procedure is scheduled on Friday, 11/30/23.  Report to [facility]tr[facility] A at  1 P.M., then check in with the Admitting office.  Call this number if you have problems the morning of surgery:  [phone]   If you have any questions prior to your surgery date call [phone]: Open Monday-Friday 8am-4pm If you experience any cold or flu symptoms such as cough, fever, chills, shortness of breath, etc. between now and your scheduled surgery, please notify us  at the above number     Remember:  Do not eat after midnight the night before your surgery-Thursday  You may drink clear liquids until 12:30 PM,  the day of your surgery-Friday.   Clear liquids allowed are: Water, Non-Citrus Juices (without pulp), Carbonated Beverages, Clear Tea, Black Coffee ONLY (NO MILK, CREAM OR POWDERED CREAMER of any kind), and Gatorade    Take these medicines the morning of surgery with A SIP OF WATER:  EUTHYROX   PARoxetine  (PAXIL )  rosuvastatin  (CRESTOR )   IF NEEDED: HYDROcodone -acetaminophen    Diabetes Medication:  Humulin  70/30 Insulin    The night prior to your procedure (Thursday) only take 7 units of Humulin  70/30 Insulin .  The day of your procedure (Friday morning), do not take your Humulin  70/30 Insulin .  We will check your blood sugar upon arrival to Short Stay and treat if needed.     As of today, STOP taking any Aspirin (unless otherwise instructed by your surgeon) Aleve, Naproxen, Ibuprofen, Motrin, Advil, Goody's, BC's, all herbal medications, fish oil, and all vitamins.           Do not wear jewelry or makeup. Do not wear lotions, powders, perfumes or deodorant. Do not shave 48 hours prior to surgery.   Do not bring valuables to the hospital. Do not wear nail polish, gel polish, artificial nails, or any other type of covering on natural nails (fingers and toes) If you have artificial nails or gel coating that need to be  removed by a nail salon, please have this removed prior to surgery. Artificial nails or gel coating may interfere with anesthesia's ability to adequately monitor your vital signs.  [name] is not responsible for any belongings or valuables.      Contacts, glasses, hearing aids, dentures or partials may not be worn into surgery, please bring cases for these belongings   For patients admitted to the hospital, discharge time will be determined by your treatment team.    SURGICAL WAITING ROOM VISITATION Patients having surgery or a procedure may have no more than 2 support people in the waiting area - these visitors may rotate.   Children under the age of 49 must have an adult with them who is not the patient. If the patient needs to stay at the hospital during part of their recovery, the visitor guidelines for inpatient rooms apply. Pre-op nurse will coordinate an appropriate time for 1 support person to accompany patient in pre-op.  This support person may not rotate.   Please refer to [email or web address] for the visitor guidelines for Inpatients (after your surgery is over and you are in a regular room).    Special instructions:    Oral Hygiene is also important to reduce your risk of infection.  Remember - BRUSH YOUR TEETH THE MORNING OF SURGERY WITH YOUR REGULAR TOOTHPASTE

## 2023-11-29 ENCOUNTER — Ambulatory Visit: Attending: Cardiology | Admitting: Cardiology

## 2023-11-29 ENCOUNTER — Ambulatory Visit: Admitting: Cardiology

## 2023-11-29 ENCOUNTER — Encounter: Payer: Self-pay | Admitting: Cardiology

## 2023-11-29 ENCOUNTER — Other Ambulatory Visit

## 2023-11-29 VITALS — BP 138/82 | HR 100 | Ht 59.0 in | Wt 140.0 lb

## 2023-11-29 DIAGNOSIS — R55 Syncope and collapse: Secondary | ICD-10-CM

## 2023-11-29 DIAGNOSIS — E785 Hyperlipidemia, unspecified: Secondary | ICD-10-CM

## 2023-11-29 DIAGNOSIS — I10 Essential (primary) hypertension: Secondary | ICD-10-CM

## 2023-11-29 DIAGNOSIS — I350 Nonrheumatic aortic (valve) stenosis: Secondary | ICD-10-CM | POA: Diagnosis not present

## 2023-11-29 NOTE — Patient Instructions (Addendum)
 Medication Instructions:  No changes *If you need a refill on your cardiac medications before your next appointment, please call your pharmacy*  Lab Work: No labs If you have labs (blood work) drawn today and your tests are completely normal, you will receive your results only by: MyChart Message (if you have MyChart) OR A paper copy in the mail If you have any lab test that is abnormal or we need to change your treatment, we will call you to review the results.  Testing/Procedures: No testing  Follow-Up: At [facility], you and your health needs are our priority.  As part of our continuing mission to provide you with exceptional heart care, our providers are all part of one team.  This team includes your primary Cardiologist (physician) and Advanced Practice Providers or APPs (Physician Assistants and Nurse Practitioners) who all work together to provide you with the care you need, when you need it.  Your next appointment:   3 week(s)  Provider:   Rollo Louder, PA-C   We recommend signing up for the patient portal called MyChart.  Sign up information is provided on this After Visit Summary.  MyChart is used to connect with patients for Virtual Visits (Telemedicine).  Patients are able to view lab/test results, encounter notes, upcoming appointments, etc.  Non-urgent messages can be sent to your provider as well.   To learn more about what you can do with MyChart, go to [email or web address].   Other Instructions Please take your blood pressure daily for 2 weeks and send in a MyChart message. Please include heart rates. (One message at the end of the 2 weeks).   HOW TO TAKE YOUR BLOOD PRESSURE: Rest 5 minutes before taking your blood pressure. Don't smoke or drink caffeinated beverages for at least 30 minutes before. Take your blood pressure before (not after) you eat. Sit comfortably with your back supported and both feet on the floor (don't cross your  legs). Elevate your arm to heart level on a table or a desk. Use the proper sized cuff. It should fit smoothly and snugly around your bare upper arm. There should be enough room to slip a fingertip under the cuff. The bottom edge of the cuff should be 1 inch above the crease of the elbow. Ideally, take 3 measurements at one sitting and record the average.  ZIO AT Long term monitor-Live Telemetry  Your physician has requested you wear a ZIO patch monitor for 14 days.  This is a single patch monitor. [employer] supplies one patch monitor per enrollment. Additional  stickers are not available.  Please do not apply patch if you will be having a Nuclear Stress Test, Echocardiogram, Cardiac CT, MRI,  or Chest Xray during the period you would be wearing the monitor. The patch cannot be worn during  these tests. You cannot remove and re-apply the ZIO AT patch monitor.  Your ZIO patch monitor will be mailed 3 day USPS to your address on file. It may take 3-5 days to  receive your monitor after you have been enrolled.  Once you have received your monitor, please review the enclosed instructions. Your monitor has  already been registered assigning a specific monitor serial # to you.   Billing and Patient Assistance Program information  [facility] has been supplied with any insurance information on record for billing. [employer] offers a sliding scale Patient Assistance Program for patients without insurance, or whose  insurance does not completely cover the cost of the ZIO patch monitor.  You must apply for the  Patient Assistance Program to qualify for the discounted rate. To apply, call [employer] at [phone],  select option 4, select option 2 , ask to apply for the Patient Assistance Program, (you can request an  interpreter if needed). [employer] will ask your household income and how many people are in your  household. [employer] will quote your out-of-pocket cost based on this information. They will also be  able  to set up a 12 month interest free payment plan if needed.  Applying the monitor   Shave hair from upper left chest.  Hold the abrader disc by orange tab. Rub the abrader in 40 strokes over left upper chest as indicated in  your monitor instructions.  Clean area with 4 enclosed alcohol pads. Use all pads to ensure the area is cleaned thoroughly. Let  dry.  Apply patch as indicated in monitor instructions. Patch will be placed under collarbone on left side of  chest with arrow pointing upward.  Rub patch adhesive wings for 2 minutes. Remove the white label marked 1. Remove the white label  marked 2. Rub patch adhesive wings for 2 additional minutes.  While looking in a mirror, press and release button in center of patch. A small green light will flash 3-4  times. This will be your only indicator that the monitor has been turned on.  Do not shower for the first 24 hours. You may shower after the first 24 hours.  Press the button if you feel a symptom. You will hear a small click. Record Date, Time and Symptom in  the Patient Log.   Starting the Gateway  In your kit there is a Audiological scientist box the size of a cellphone. This is Buyer, retail. It transmits all your  recorded data to [facility]. This box must always stay within 10 feet of you. Open the box and push the *  button. There will be a light that blinks orange and then green a few times. When the light stops  blinking, the Gateway is connected to the ZIO patch. Call [employer] at [phone] to confirm your monitor is transmitting.  Returning your monitor  Remove your patch and place it inside the Gateway. In the lower half of the Gateway there is a white  bag with prepaid postage on it. Place Gateway in bag and seal. Mail package back to [facility] as soon as  possible. Your physician should have your final report approximately 7 days after you have mailed back  your monitor. Call [facility] Customer Care at  [phone] if you have questions regarding your ZIO AT  patch monitor. Call them immediately if you see an orange light blinking on your monitor.  If your monitor falls off in less than 4 days, contact our Monitor department at [phone]. If your  monitor becomes loose or falls off after 4 days call [employer] at [phone] for suggestions on  securing your monitor

## 2023-11-29 NOTE — Progress Notes (Signed)
 Anesthesia Chart Review: Same day workup  73 year old female with pertinent history including uncontrolled IDDM 2 (A1c >15.5 on 07/20/2023 during admission for DKA), hypothyroid, HTN, HLD, CKD 3B.  Recent admission 7/13 through 11/16/2023 after presenting with fall secondary to syncopal event with resultant right humeral fracture.  Hospitalization complicated by AKI, treated with IV fluids.  Echocardiogram significant for moderate to severe paradoxical low-flow low gradient aortic stenosis.  Cardiology was consulted and recommended outpatient consideration of planning for possible TAVR.  Per consult note 11/15/2023,  Moderate-severe aortic stenosis: Multiple possible etiologies.  It is concerning that she does not have any prodrome, which suggest cardiac syncope.  Confounded by the fact that she also does have orthostatic symptoms.  There were no arrhythmias on telemetry.  Blood pressures are quite labile.  I personally reviewed her echocardiogram.  Systolic function is normal.  There is minimal excursion of the noncoronary cusp.  But dimensionless index the aortic stenosis is moderate, though the mean gradient is consistent with mild aortic stenosis.  Overall I think it is moderate to severe.  Certainly need to avoid hypotension.  Ultimately this probably needs to be replaced.  Of course this is more complicated given that she also needs to have surgery on her shoulder.  TAVR would delay shoulder surgery.  This is not something that is done acutely and requires outpatient planning.  If the surgical risk is acceptable to anesthesia and the patient, it makes most sense to proceed with her shoulder surgery while avoiding hypotension and maintaining her hemoglobin greater than 8.  Would then recommend outpatient TAVR evaluation once more medically stable.  Continue to hold atenolol .  Will check orthostatic vital signs.  Agree with a live ambulatory monitor at discharge.  Ambulatory monitor results are still  pending.  BMP and CBC 11/14/2023 reviewed, creatinine mildly elevated at 1.37, mild anemia with hemoglobin 11.4, otherwise unremarkable.  EKG 11/11/2023: Sinus rhythm.  Rate 72. Inferior infarct, old. Probable anterior infarct, old  TTE 11/12/2023: 1. Left ventricular ejection fraction, by estimation, is 60 to 65%. The  left ventricle has normal function. The left ventricle has no regional  wall motion abnormalities. There is mild left ventricular hypertrophy.  Left ventricular diastolic parameters  were normal.   2. Right ventricular systolic function is mildly reduced. The right  ventricular size is normal. Mildly increased right ventricular wall  thickness. Tricuspid regurgitation signal is inadequate for assessing PA  pressure.   3. The mitral valve is degenerative. No evidence of mitral valve  regurgitation. No evidence of mitral stenosis.   4. The aortic valve is tricuspid. Aortic valve regurgitation is mild.  Paradoxical low flow, low gradient atleast moderate AS (peak velocity  2.25m/s, MG , AVA (VTI) 1.07cm2, planimetry 0.89cm2, DI 0.42, SVi  26).   5. The inferior vena cava is normal in size with <50% respiratory  variability, suggesting right atrial pressure of 8 mmHg.   Comparison(s): No prior Echocardiogram.     Lynwood Geofm RIGGERS [facility] Short Stay Center/Anesthesiology Phone [phone] 11/29/2023 11:31 AM

## 2023-11-29 NOTE — Progress Notes (Unsigned)
 Enrolled for [employer] to mail a [employer] AT Live Telemetry monitor to patients address on file.   Dr. Duke Salvia to read.

## 2023-11-29 NOTE — Anesthesia Preprocedure Evaluation (Signed)
 Anesthesia Evaluation  Patient identified by MRN, date of birth, ID band Patient awake    Reviewed: Allergy & Precautions, NPO status , Patient's Chart, lab work & pertinent test results  History of Anesthesia Complications Negative for: history of anesthetic complications  Airway Mallampati: II  TM Distance: >3 FB Neck ROM: Full    Dental  (+) Dental Advisory Given, Poor Dentition, Chipped   Pulmonary neg shortness of breath, neg sleep apnea, neg COPD, neg recent URI   breath sounds clear to auscultation       Cardiovascular hypertension, (-) angina (-) Past MI and (-) CHF + Valvular Problems/Murmurs AI and AS  Rhythm:Regular + Systolic murmurs  1. Left ventricular ejection fraction, by estimation, is 60 to 65%. The  left ventricle has normal function. The left ventricle has no regional  wall motion abnormalities. There is mild left ventricular hypertrophy.  Left ventricular diastolic parameters  were normal.   2. Right ventricular systolic function is mildly reduced. The right  ventricular size is normal. Mildly increased right ventricular wall  thickness. Tricuspid regurgitation signal is inadequate for assessing PA  pressure.   3. The mitral valve is degenerative. No evidence of mitral valve  regurgitation. No evidence of mitral stenosis.   4. The aortic valve is tricuspid. Aortic valve regurgitation is mild.  Paradoxical low flow, low gradient atleast moderate AS (peak velocity  2.13m/s, MG , AVA (VTI) 1.07cm2, planimetry 0.89cm2, DI 0.42, SVi  26).   5. The inferior vena cava is normal in size with <50% respiratory  variability, suggesting right atrial pressure of 8 mmHg.      Neuro/Psych neg Seizures PSYCHIATRIC DISORDERS  Depression       GI/Hepatic negative GI ROS, Neg liver ROS,,,  Endo/Other  diabetes, Insulin  Dependent    Renal/GU Renal InsufficiencyRenal diseaseLab Results      Component                 Value               Date                      NA                       137                 11/14/2023                K                        4.0                 11/14/2023                CO2                      22                  11/14/2023                GLUCOSE                  165 (H)             11/14/2023                BUN  40 (H)              11/14/2023                CREATININE               1.37 (H)            11/14/2023                CALCIUM                   7.6 (L)             11/14/2023                EGFR                     35 (L)              10/10/2022                GFRNONAA                 41 (L)              11/14/2023                Musculoskeletal  (+) Arthritis ,  Right shoulder proximal humerus fracture   Abdominal   Peds  Hematology  (+) Blood dyscrasia, anemia Lab Results      Component                Value               Date                      WBC                      13.7 (H)            11/14/2023                HGB                      11.4 (L)            11/14/2023                HCT                      35.4 (L)            11/14/2023                MCV                      93.2                11/14/2023                PLT                      233                 11/14/2023              Anesthesia Other Findings   Reproductive/Obstetrics  Anesthesia Physical Anesthesia Plan  ASA: 3  Anesthesia Plan: General and Regional   Post-op Pain Management: Regional block*   Induction: Intravenous  PONV Risk Score and Plan: 3 and Ondansetron  and Dexamethasone  Airway Management Planned: Oral ETT  Additional Equipment:   Intra-op Plan:   Post-operative Plan: Extubation in OR  Informed Consent: I have reviewed the patients History and Physical, chart, labs and discussed the procedure including the risks, benefits and alternatives for the proposed anesthesia with the  patient or authorized representative who has indicated his/her understanding and acceptance.     Dental advisory given  Plan Discussed with: CRNA  Anesthesia Plan Comments: (PAT note by Lynwood Hope, PA-C: 73 year old female with pertinent history including uncontrolled IDDM 2 (A1c >15.5 on 07/20/2023 during admission for DKA), hypothyroid, HTN, HLD, CKD 3B.  Recent admission 7/13 through 11/16/2023 after presenting with fall secondary to syncopal event with resultant right humeral fracture.  Hospitalization complicated by AKI, treated with IV fluids.  Echocardiogram significant for moderate to severe paradoxical low-flow low gradient aortic stenosis.  Cardiology was consulted and recommended outpatient consideration of planning for possible TAVR.  Per consult note 11/15/2023,  Moderate-severe aortic stenosis: Multiple possible etiologies.  It is concerning that she does not have any prodrome, which suggest cardiac syncope.  Confounded by the fact that she also does have orthostatic symptoms.  There were no arrhythmias on telemetry.  Blood pressures are quite labile.  I personally reviewed her echocardiogram.  Systolic function is normal.  There is minimal excursion of the noncoronary cusp.  But dimensionless index the aortic stenosis is moderate, though the mean gradient is consistent with mild aortic stenosis.  Overall I think it is moderate to severe.  Certainly need to avoid hypotension.  Ultimately this probably needs to be replaced.  Of course this is more complicated given that she also needs to have surgery on her shoulder.  TAVR would delay shoulder surgery.  This is not something that is done acutely and requires outpatient planning.  If the surgical risk is acceptable to anesthesia and the patient, it makes most sense to proceed with her shoulder surgery while avoiding hypotension and maintaining her hemoglobin greater than 8.  Would then recommend outpatient TAVR evaluation once more medically  stable.  Continue to hold atenolol .  Will check orthostatic vital signs.  Agree with a live ambulatory monitor at discharge.  Ambulatory monitor results are still pending.  BMP and CBC 11/14/2023 reviewed, creatinine mildly elevated at 1.37, mild anemia with hemoglobin 11.4, otherwise unremarkable.  EKG 11/11/2023: Sinus rhythm.  Rate 72. Inferior infarct, old. Probable anterior infarct, old  TTE 11/12/2023: 1. Left ventricular ejection fraction, by estimation, is 60 to 65%. The  left ventricle has normal function. The left ventricle has no regional  wall motion abnormalities. There is mild left ventricular hypertrophy.  Left ventricular diastolic parameters  were normal.   2. Right ventricular systolic function is mildly reduced. The right  ventricular size is normal. Mildly increased right ventricular wall  thickness. Tricuspid regurgitation signal is inadequate for assessing PA  pressure.   3. The mitral valve is degenerative. No evidence of mitral valve  regurgitation. No evidence of mitral stenosis.   4. The aortic valve is tricuspid. Aortic valve regurgitation is mild.  Paradoxical low flow, low gradient atleast moderate AS (peak velocity  2.35m/s, MG , AVA (VTI) 1.07cm2, planimetry 0.89cm2, DI 0.42, SVi  26).   5. The inferior vena cava is normal in size with <50% respiratory  variability, suggesting right atrial pressure of 8 mmHg.   Comparison(s): No prior Echocardiogram.    )         Anesthesia Quick Evaluation

## 2023-11-29 NOTE — Progress Notes (Signed)
 Cardiology Office Note   Date:  11/29/2023  ID:  [name], [name] [birth year], MRN [id number] PCP: Patient, No Pcp Per  [facility] Providers Cardiologist:  Annabella Scarce, MD  History of Present Illness Jennifer Ingram is a 73 y.o. female with a past medical history of type 2 DM, aortic stenosis, HTN, HLD, CKD stage IIIb, hypothyroidism, depression, recent syncopal event. Patient is followed by Dr. Scarce and presents today for a hospital follow up appointment   Patient had presented to the ED on 7/13 after she had a presumed syncopal episode and fall. After the fall, she had significant pain in her right shoulder. Found to have a surgical neck fracture of the right humerus. In the ED, found to have glucose 540. Troponin negative. Treated with IV fluids. Echocardiogram 7/14 showed EF 60-65%, no regional wall motion abnormalities, mild LVH, mildly reduced RV systolic function, no evidence of mitral valve regurgitation or stenosis, mild AI, low-flow/low gradient at least moderate AS. Cardiology was consulted. Her BP was very labile during her admission and her atenolol  was held. Dr. Shirly reviewed echo, felt aortic stenosis was overall moderate-severe. Recommended avoiding hypotension, outpatient TAVR workup. If surgical risk was acceptable to anesthesia and the patient, it was felt to be OK to proceed with surgery.   Patient reports that she has been having quite a bit of right arm pain which has been making her feel poorly.  She denies any chest pain or shortness of breath.  Denies syncope, near syncope.  Denies palpitations.  She has been keeping an eye on her blood pressure, checks it every morning.  Reports that her blood pressure has overall been well-controlled but has been a bit high at times.  She is scheduled for orthopedic surgery tomorrow. She lives with her son    Studies Reviewed  Cardiac Studies & Procedures    ______________________________________________________________________________________________     ECHOCARDIOGRAM  ECHOCARDIOGRAM COMPLETE 11/12/2023  Narrative ECHOCARDIOGRAM REPORT    Patient Name:   Jennifer Ingram Date of Exam: 11/12/2023 Medical Rec #:  [id number]      Height:       60.0 in Accession #:    [id number]     Weight:       147.0 lb Date of Birth:  07/04/1950      BSA:          1.638 m Patient Age:    73 years       BP:           114/67 mmHg Patient Gender: F              HR:           79 bpm. Exam Location:  Inpatient  Procedure: 2D Echo, Cardiac Doppler, Color Doppler and Intracardiac Opacification Agent (Both Spectral and Color Flow Doppler were utilized during procedure).  Indications:    Syncope  History:        Patient has no prior history of Echocardiogram examinations. Risk Factors:Hypertension, Diabetes and Dyslipidemia.  Sonographer:    Therisa Crouch Referring Phys: [id number] ALLISON WOLFE  IMPRESSIONS   1. Left ventricular ejection fraction, by estimation, is 60 to 65%. The left ventricle has normal function. The left ventricle has no regional wall motion abnormalities. There is mild left ventricular hypertrophy. Left ventricular diastolic parameters were normal. 2. Right ventricular systolic function is mildly reduced. The right ventricular size is normal. Mildly increased right ventricular wall thickness. Tricuspid regurgitation signal is inadequate for assessing  PA pressure. 3. The mitral valve is degenerative. No evidence of mitral valve regurgitation. No evidence of mitral stenosis. 4. The aortic valve is tricuspid. Aortic valve regurgitation is mild. Paradoxical low flow, low gradient atleast moderate AS (peak velocity 2.52m/s, MG , AVA (VTI) 1.07cm2, planimetry 0.89cm2, DI 0.42, SVi 26). 5. The inferior vena cava is normal in size with <50% respiratory variability, suggesting right atrial pressure of 8 mmHg.  Comparison(s): No prior  Echocardiogram.  Conclusion(s)/Recommendation(s): No left ventricular mural or apical thrombus/thrombi. Either consider aortic valve calcium  score or invasive hemodynamics to further clarify the severity of aortic stenosis.  FINDINGS Left Ventricle: Left ventricular ejection fraction, by estimation, is 60 to 65%. The left ventricle has normal function. The left ventricle has no regional wall motion abnormalities. The left ventricular internal cavity size was small. There is mild left ventricular hypertrophy. Left ventricular diastolic parameters were normal.  Right Ventricle: The right ventricular size is normal. Mildly increased right ventricular wall thickness. Right ventricular systolic function is mildly reduced. Tricuspid regurgitation signal is inadequate for assessing PA pressure.  Left Atrium: Left atrial size was normal in size.  Right Atrium: Right atrial size was normal in size.  Pericardium: There is no evidence of pericardial effusion.  Mitral Valve: The mitral valve is degenerative in appearance. There is mild thickening of the mitral valve leaflet(s). Normal mobility of the mitral valve leaflets. Mild mitral annular calcification. No evidence of mitral valve regurgitation. No evidence of mitral valve stenosis.  Tricuspid Valve: The tricuspid valve is grossly normal. Tricuspid valve regurgitation is not demonstrated. No evidence of tricuspid stenosis.  Aortic Valve: The aortic valve is tricuspid. Aortic valve regurgitation is mild. Paradoxical low flow, low gradient atleast moderate AS (peak velocity 2.33m/s, MG , AVA (VTI) 1.07cm2, planimetry 0.89cm2, DI 0.42, SVi 26). Aortic valve mean gradient measures 10.5 mmHg. Aortic valve peak gradient measures 19.4 mmHg. Aortic valve area, by VTI measures 1.07 cm.  Pulmonic Valve: The pulmonic valve was normal in structure. Pulmonic valve regurgitation is mild. No evidence of pulmonic stenosis.  Aorta: The aortic root and  ascending aorta are structurally normal, with no evidence of dilitation.  Venous: The inferior vena cava is normal in size with less than 50% respiratory variability, suggesting right atrial pressure of 8 mmHg.  IAS/Shunts: The atrial septum is grossly normal.   LEFT VENTRICLE PLAX 2D LVIDd:         3.20 cm     Diastology LVIDs:         2.00 cm     LV e' medial:    6.31 cm/s LV PW:         1.20 cm     LV E/e' medial:  10.2 LV IVS:        1.10 cm     LV e' lateral:   5.22 cm/s LVOT diam:     1.80 cm     LV E/e' lateral: 12.3 LV SV:         42 LV SV Index:   26 LVOT Area:     2.54 cm  LV Volumes (MOD) LV vol d, MOD A2C: 51.4 ml LV vol d, MOD A4C: 51.3 ml LV vol s, MOD A2C: 18.8 ml LV vol s, MOD A4C: 19.3 ml LV SV MOD A2C:     32.6 ml LV SV MOD A4C:     51.3 ml LV SV MOD BP:      33.0 ml  RIGHT VENTRICLE  IVC RV S prime:     8.49 cm/s  IVC diam: 1.90 cm TAPSE (M-mode): 1.6 cm  LEFT ATRIUM             Index LA diam:        2.90 cm 1.77 cm/m LA Vol (A2C):   34.6 ml 21.12 ml/m LA Vol (A4C):   38.3 ml 23.38 ml/m LA Biplane Vol: 38.7 ml 23.63 ml/m AORTIC VALVE AV Area (Vmax):    1.01 cm AV Area (Vmean):   0.99 cm AV Area (VTI):     1.07 cm AV Vmax:           220.00 cm/s AV Vmean:          152.500 cm/s AV VTI:            0.398 m AV Peak Grad:      19.4 mmHg AV Mean Grad:      10.5 mmHg LVOT Vmax:         87.00 cm/s LVOT Vmean:        59.600 cm/s LVOT VTI:          0.167 m LVOT/AV VTI ratio: 0.42  AORTA Ao Root diam: 3.20 cm Ao Asc diam:  3.20 cm  MITRAL VALVE MV Area (PHT): 3.07 cm    SHUNTS MV Decel Time: 247 msec    Systemic VTI:  0.17 m MV E velocity: 64.30 cm/s  Systemic Diam: 1.80 cm MV A velocity: 84.00 cm/s MV E/A ratio:  0.77  Sunit Tolia Electronically signed by Madonna Large Signature Date/Time: 11/12/2023/5:02:42 PM    Final           ______________________________________________________________________________________________       Risk Assessment/Calculations           Physical Exam VS:  BP 138/82   Pulse 100   Ht 4' 11 (1.499 m)   Wt 140 lb (63.5 kg)   SpO2 93%   BMI 28.28 kg/m        Wt Readings from Last 3 Encounters:  11/29/23 140 lb (63.5 kg)  11/16/23 152 lb 8.9 oz (69.2 kg)  09/14/23 136 lb 6.4 oz (61.9 kg)    GEN: Well nourished, well developed in no acute distress. Sitting comfortably in her wheelchair  NECK: No JVD  CARDIAC:  RRR. Grade 2/6 systolic murmur  RESPIRATORY:  Clear to auscultation without rales, wheezing or rhonchi. Normal WOB on room air   ABDOMEN: Soft, non-tender, non-distended EXTREMITIES:  No edema; Right arm in sling, bruise present on upper arm   ASSESSMENT AND PLAN   Syncope  Labile BP  - Patient recently admitted after a syncopal episode and fall on 7/13. Had labile BP during her admission. Also had poorly controlled diabetes and AKI. Echocardiogram showed moderate-severe AS  - Orthostatic vital signs were negative, but patient did become symptomatic upon standing when in the hospital. Atenolol  held for labile BP. Since getting out of the hospital, her BP has been a bit higher than usual. She has not been keeping a log so she does not remember exact numbers  - Patient has not had any syncope, near syncope since being discharged from the hospital  - I am hesitant to resume atenolol  with her recent syncope and dizziness upon standing. Instructed her to keep a BP log, checking her BP once in the AM and once in the PM. Instructed her to not start the BP log until a few days after her surgery on 8/1 and return log  in about 2 weeks - Encouraged patient to remain hydrated  - Cardiac monitor pending - reports monitor never arrived at her home. I have asked out staff to look into this more and send a repeat monitor if needed   Moderate-severe AS  - Noted on echocardiogram  in 10/2023  - Pending outpatient TAVR eval - seeing Dr. Verlin in August  - Patient denies chest pain, shortness of breath, syncope, near syncope   HLD  - Lipid panel from 06/2023 showed LDL 100, HDL 33, triglycerides 196, total cholesterol 172  - continue crestor  20 mg daily   Type 2 DM  - A1c >15.5% - Followed by [facility]  - On insulin    Dispo: follow up with APP in   Signed, Rollo FABIENE Louder, PA-C

## 2023-11-30 ENCOUNTER — Encounter (HOSPITAL_COMMUNITY)
Admission: RE | Disposition: A | Discharge status: Skilled Nursing Facility | Payer: Self-pay | Source: Home / Self Care | Attending: Orthopedic Surgery

## 2023-11-30 ENCOUNTER — Observation Stay (HOSPITAL_COMMUNITY)
Admission: RE | Admit: 2023-11-30 | Discharge: 2023-12-01 | Disposition: A | Discharge status: Home / Self Care | Attending: Orthopedic Surgery | Admitting: Orthopedic Surgery

## 2023-11-30 ENCOUNTER — Ambulatory Visit (HOSPITAL_COMMUNITY)

## 2023-11-30 ENCOUNTER — Encounter (HOSPITAL_COMMUNITY): Payer: Self-pay | Admitting: Orthopedic Surgery

## 2023-11-30 ENCOUNTER — Encounter (HOSPITAL_COMMUNITY): Admitting: Physician Assistant

## 2023-11-30 ENCOUNTER — Other Ambulatory Visit: Payer: Self-pay

## 2023-11-30 ENCOUNTER — Ambulatory Visit (HOSPITAL_BASED_OUTPATIENT_CLINIC_OR_DEPARTMENT_OTHER): Admitting: Physician Assistant

## 2023-11-30 DIAGNOSIS — I10 Essential (primary) hypertension: Secondary | ICD-10-CM | POA: Diagnosis not present

## 2023-11-30 DIAGNOSIS — E119 Type 2 diabetes mellitus without complications: Secondary | ICD-10-CM | POA: Insufficient documentation

## 2023-11-30 DIAGNOSIS — Z79899 Other long term (current) drug therapy: Secondary | ICD-10-CM | POA: Diagnosis not present

## 2023-11-30 DIAGNOSIS — Z794 Long term (current) use of insulin: Secondary | ICD-10-CM | POA: Insufficient documentation

## 2023-11-30 DIAGNOSIS — W19XXXA Unspecified fall, initial encounter: Secondary | ICD-10-CM | POA: Diagnosis not present

## 2023-11-30 DIAGNOSIS — G8918 Other acute postprocedural pain: Secondary | ICD-10-CM | POA: Diagnosis not present

## 2023-11-30 DIAGNOSIS — S42201A Unspecified fracture of upper end of right humerus, initial encounter for closed fracture: Secondary | ICD-10-CM | POA: Diagnosis not present

## 2023-11-30 DIAGNOSIS — E1122 Type 2 diabetes mellitus with diabetic chronic kidney disease: Secondary | ICD-10-CM | POA: Diagnosis not present

## 2023-11-30 DIAGNOSIS — N1832 Chronic kidney disease, stage 3b: Secondary | ICD-10-CM | POA: Diagnosis not present

## 2023-11-30 DIAGNOSIS — I129 Hypertensive chronic kidney disease with stage 1 through stage 4 chronic kidney disease, or unspecified chronic kidney disease: Secondary | ICD-10-CM

## 2023-11-30 DIAGNOSIS — E785 Hyperlipidemia, unspecified: Secondary | ICD-10-CM | POA: Insufficient documentation

## 2023-11-30 DIAGNOSIS — E039 Hypothyroidism, unspecified: Secondary | ICD-10-CM

## 2023-11-30 DIAGNOSIS — E1142 Type 2 diabetes mellitus with diabetic polyneuropathy: Principal | ICD-10-CM

## 2023-11-30 DIAGNOSIS — M25511 Pain in right shoulder: Secondary | ICD-10-CM | POA: Diagnosis present

## 2023-11-30 DIAGNOSIS — S42291A Other displaced fracture of upper end of right humerus, initial encounter for closed fracture: Secondary | ICD-10-CM | POA: Diagnosis present

## 2023-11-30 HISTORY — DX: Hypothyroidism, unspecified: E03.9

## 2023-11-30 HISTORY — PX: ORIF HUMERUS FRACTURE: SHX2126

## 2023-11-30 LAB — BASIC METABOLIC PANEL WITH GFR
Anion gap: 10 (ref 5–15)
BUN: 10 mg/dL (ref 8–23)
CO2: 25 mmol/L (ref 22–32)
Calcium: 8.5 mg/dL — ABNORMAL LOW (ref 8.9–10.3)
Chloride: 104 mmol/L (ref 98–111)
Creatinine, Ser: 0.96 mg/dL (ref 0.44–1.00)
GFR, Estimated: >60 mL/min (ref >60)
Glucose, Bld: 293 mg/dL — ABNORMAL HIGH (ref 70–99)
Potassium: 3.5 mmol/L (ref 3.5–5.1)
Sodium: 139 mmol/L (ref 135–145)

## 2023-11-30 LAB — GLUCOSE, CAPILLARY
Glucose-Capillary: 278 mg/dL — ABNORMAL HIGH (ref 70–99)
Glucose-Capillary: 250 mg/dL — ABNORMAL HIGH (ref 70–99)
Glucose-Capillary: 216 mg/dL — ABNORMAL HIGH (ref 70–99)
Glucose-Capillary: 241 mg/dL — ABNORMAL HIGH (ref 70–99)

## 2023-11-30 LAB — CBC
HCT: 42.8 % (ref 36.0–46.0)
Hemoglobin: 14.1 g/dL (ref 12.0–15.0)
MCH: 30.4 pg (ref 26.0–34.0)
MCHC: 32.9 g/dL (ref 30.0–36.0)
MCV: 92.2 fL (ref 80.0–100.0)
Platelets: 304 K/uL (ref 150–400)
RBC: 4.64 MIL/uL (ref 3.87–5.11)
RDW: 13.7 % (ref 11.5–15.5)
WBC: 13.4 K/uL — ABNORMAL HIGH (ref 4.0–10.5)
nRBC: 0 % (ref 0.0–0.2)

## 2023-11-30 LAB — SURGICAL PCR SCREEN
MRSA, PCR: NEGATIVE
Staphylococcus aureus: POSITIVE — AB

## 2023-11-30 SURGERY — OPEN REDUCTION INTERNAL FIXATION (ORIF) PROXIMAL HUMERUS FRACTURE
Anesthesia: Regional | Site: Shoulder | Laterality: Right

## 2023-11-30 MED ORDER — INSULIN ASPART 100 UNIT/ML IJ SOLN
0.0000 [IU] | INTRAMUSCULAR | Status: DC | PRN
  Administered 2023-11-30: 4 [IU] via SUBCUTANEOUS

## 2023-11-30 MED ORDER — MEPERIDINE HCL 25 MG/ML IJ SOLN: 6.2500 mg | INTRAMUSCULAR | Status: DC | PRN

## 2023-11-30 MED ORDER — PAROXETINE HCL 20 MG PO TABS
40.0000 mg | ORAL_TABLET | Freq: Every day | ORAL | Status: DC
  Administered 2023-11-30 – 2023-12-01 (×2): 40 mg via ORAL
  Filled 2023-11-30 (×2): qty 2

## 2023-11-30 MED ORDER — PHENYLEPHRINE 80 MCG/ML (10ML) SYRINGE FOR IV PUSH (FOR BLOOD PRESSURE SUPPORT)
PREFILLED_SYRINGE | INTRAVENOUS | Status: DC | PRN
  Administered 2023-11-30 (×2): 160 ug via INTRAVENOUS

## 2023-11-30 MED ORDER — DOCUSATE SODIUM 100 MG PO CAPS
100.0000 mg | ORAL_CAPSULE | Freq: Two times a day (BID) | ORAL | Status: DC
  Administered 2023-11-30 – 2023-12-01 (×2): 100 mg via ORAL
  Filled 2023-11-30 (×2): qty 1

## 2023-11-30 MED ORDER — VANCOMYCIN HCL 1000 MG IV SOLR
INTRAVENOUS | Status: AC
  Filled 2023-11-30: qty 20

## 2023-11-30 MED ORDER — MIDAZOLAM HCL 2 MG/2ML IJ SOLN: 0.5000 mg | Freq: Once | INTRAMUSCULAR | Status: DC | PRN

## 2023-11-30 MED ORDER — ONDANSETRON 4 MG PO TBDP: 4.0000 mg | ORAL_TABLET | Freq: Three times a day (TID) | ORAL | 0 refills | Status: DC | PRN

## 2023-11-30 MED ORDER — INSULIN ASPART 100 UNIT/ML IJ SOLN
0.0000 [IU] | Freq: Every day | INTRAMUSCULAR | Status: DC
  Administered 2023-11-30: 2 [IU] via SUBCUTANEOUS

## 2023-11-30 MED ORDER — BUPIVACAINE-EPINEPHRINE (PF) 0.5% -1:200000 IJ SOLN
INTRAMUSCULAR | Status: DC | PRN
Start: 2023-11-30 — End: 2023-11-30
  Administered 2023-11-30: 15 mL via PERINEURAL

## 2023-11-30 MED ORDER — ENOXAPARIN SODIUM 40 MG/0.4ML IJ SOSY
40.0000 mg | PREFILLED_SYRINGE | INTRAMUSCULAR | Status: DC
  Administered 2023-12-01: 40 mg via SUBCUTANEOUS
  Filled 2023-11-30: qty 0.4

## 2023-11-30 MED ORDER — OXYCODONE HCL 5 MG/5ML PO SOLN: 5.0000 mg | Freq: Once | ORAL | Status: DC | PRN

## 2023-11-30 MED ORDER — ORAL CARE MOUTH RINSE: 15.0000 mL | Freq: Once | OROMUCOSAL | Status: AC

## 2023-11-30 MED ORDER — OXYCODONE HCL 5 MG PO TABS: 5.0000 mg | ORAL_TABLET | ORAL | 0 refills | Status: DC | PRN

## 2023-11-30 MED ORDER — ROSUVASTATIN CALCIUM 20 MG PO TABS
20.0000 mg | ORAL_TABLET | Freq: Every day | ORAL | Status: DC
Start: 2023-11-30 — End: 2023-12-01
  Administered 2023-11-30 – 2023-12-01 (×2): 20 mg via ORAL
  Filled 2023-11-30 (×2): qty 1

## 2023-11-30 MED ORDER — ACETAMINOPHEN 325 MG PO TABS: 325.0000 mg | ORAL_TABLET | Freq: Four times a day (QID) | ORAL | Status: DC | PRN

## 2023-11-30 MED ORDER — PROPOFOL 10 MG/ML IV BOLUS
INTRAVENOUS | Status: DC | PRN
  Administered 2023-11-30: 90 mg via INTRAVENOUS

## 2023-11-30 MED ORDER — MIDAZOLAM HCL 2 MG/2ML IJ SOLN: 0.5000 mg | Freq: Once | INTRAMUSCULAR | Status: AC

## 2023-11-30 MED ORDER — TRANEXAMIC ACID-NACL 1000-0.7 MG/100ML-% IV SOLN
1000.0000 mg | INTRAVENOUS | Status: AC
  Administered 2023-11-30: 1000 mg via INTRAVENOUS
  Filled 2023-11-30: qty 100

## 2023-11-30 MED ORDER — HYDROMORPHONE HCL 1 MG/ML IJ SOLN: 0.2500 mg | INTRAMUSCULAR | Status: DC | PRN

## 2023-11-30 MED ORDER — PHENYLEPHRINE HCL-NACL 20-0.9 MG/250ML-% IV SOLN
INTRAVENOUS | Status: DC | PRN
  Administered 2023-11-30: 80 ug/min via INTRAVENOUS

## 2023-11-30 MED ORDER — DEXAMETHASONE SODIUM PHOSPHATE 10 MG/ML IJ SOLN
INTRAMUSCULAR | Status: DC | PRN
  Administered 2023-11-30: 10 mg via INTRAVENOUS

## 2023-11-30 MED ORDER — METOCLOPRAMIDE HCL 5 MG PO TABS: 5.0000 mg | ORAL_TABLET | Freq: Three times a day (TID) | ORAL | Status: DC | PRN

## 2023-11-30 MED ORDER — MORPHINE SULFATE (PF) 2 MG/ML IV SOLN: 0.5000 mg | INTRAVENOUS | Status: DC | PRN

## 2023-11-30 MED ORDER — LEVOTHYROXINE SODIUM 50 MCG PO TABS
50.0000 ug | ORAL_TABLET | Freq: Every day | ORAL | Status: DC
  Administered 2023-12-01: 50 ug via ORAL
  Filled 2023-11-30: qty 1

## 2023-11-30 MED ORDER — MENTHOL 3 MG MT LOZG: 1.0000 | LOZENGE | OROMUCOSAL | Status: DC | PRN

## 2023-11-30 MED ORDER — ONDANSETRON HCL 4 MG/2ML IJ SOLN: 4.0000 mg | Freq: Four times a day (QID) | INTRAMUSCULAR | Status: DC | PRN

## 2023-11-30 MED ORDER — HYDROCODONE-ACETAMINOPHEN 7.5-325 MG PO TABS
1.0000 | ORAL_TABLET | ORAL | Status: DC | PRN
  Administered 2023-11-30: 1 via ORAL
  Filled 2023-11-30: qty 1

## 2023-11-30 MED ORDER — OXYCODONE HCL 5 MG PO TABS: 5.0000 mg | ORAL_TABLET | Freq: Once | ORAL | Status: DC | PRN

## 2023-11-30 MED ORDER — LIDOCAINE 2% (20 MG/ML) 5 ML SYRINGE
INTRAMUSCULAR | Status: AC
  Filled 2023-11-30: qty 5

## 2023-11-30 MED ORDER — ROCURONIUM BROMIDE 10 MG/ML (PF) SYRINGE
PREFILLED_SYRINGE | INTRAVENOUS | Status: DC | PRN
  Administered 2023-11-30: 40 mg via INTRAVENOUS

## 2023-11-30 MED ORDER — HYDROMORPHONE HCL 1 MG/ML IJ SOLN
INTRAMUSCULAR | Status: AC
  Filled 2023-11-30: qty 1

## 2023-11-30 MED ORDER — FENTANYL CITRATE (PF) 250 MCG/5ML IJ SOLN
INTRAMUSCULAR | Status: AC
  Filled 2023-11-30: qty 5

## 2023-11-30 MED ORDER — TRAMADOL HCL 50 MG PO TABS: 50.0000 mg | ORAL_TABLET | Freq: Four times a day (QID) | ORAL | Status: DC | PRN

## 2023-11-30 MED ORDER — LACTATED RINGERS IV SOLN: INTRAVENOUS | Status: DC

## 2023-11-30 MED ORDER — SUGAMMADEX SODIUM 200 MG/2ML IV SOLN
INTRAVENOUS | Status: DC | PRN
  Administered 2023-11-30: 200 mg via INTRAVENOUS

## 2023-11-30 MED ORDER — MIDAZOLAM HCL 2 MG/2ML IJ SOLN
INTRAMUSCULAR | Status: AC
  Filled 2023-11-30: qty 2

## 2023-11-30 MED ORDER — FENTANYL CITRATE (PF) 100 MCG/2ML IJ SOLN
INTRAMUSCULAR | Status: AC
Start: 2023-11-30 — End: 2023-11-30
  Administered 2023-11-30: 25 ug via INTRAVENOUS
  Filled 2023-11-30: qty 2

## 2023-11-30 MED ORDER — ONDANSETRON HCL 4 MG/2ML IJ SOLN
INTRAMUSCULAR | Status: DC | PRN
  Administered 2023-11-30: 4 mg via INTRAVENOUS

## 2023-11-30 MED ORDER — CEFAZOLIN SODIUM-DEXTROSE 2-4 GM/100ML-% IV SOLN
2.0000 g | INTRAVENOUS | Status: AC
  Administered 2023-11-30: 2 g via INTRAVENOUS
  Filled 2023-11-30: qty 100

## 2023-11-30 MED ORDER — FENTANYL CITRATE (PF) 250 MCG/5ML IJ SOLN
INTRAMUSCULAR | Status: DC | PRN
  Administered 2023-11-30: 50 ug via INTRAVENOUS

## 2023-11-30 MED ORDER — ONDANSETRON HCL 4 MG/2ML IJ SOLN
INTRAMUSCULAR | Status: AC
  Filled 2023-11-30: qty 2

## 2023-11-30 MED ORDER — METOCLOPRAMIDE HCL 5 MG/ML IJ SOLN: 5.0000 mg | Freq: Three times a day (TID) | INTRAMUSCULAR | Status: DC | PRN

## 2023-11-30 MED ORDER — BUPIVACAINE LIPOSOME 1.3 % IJ SUSP
INTRAMUSCULAR | Status: DC | PRN
  Administered 2023-11-30: 10 mL via PERINEURAL

## 2023-11-30 MED ORDER — FENTANYL CITRATE (PF) 100 MCG/2ML IJ SOLN: 25.0000 ug | Freq: Once | INTRAMUSCULAR | Status: AC

## 2023-11-30 MED ORDER — CHLORHEXIDINE GLUCONATE 0.12 % MT SOLN
15.0000 mL | Freq: Once | OROMUCOSAL | Status: AC
  Administered 2023-11-30: 15 mL via OROMUCOSAL
  Filled 2023-11-30: qty 15

## 2023-11-30 MED ORDER — INSULIN ASPART 100 UNIT/ML IJ SOLN
3.0000 [IU] | Freq: Once | INTRAMUSCULAR | Status: AC
  Administered 2023-11-30: 3 [IU] via SUBCUTANEOUS

## 2023-11-30 MED ORDER — EPHEDRINE 5 MG/ML INJ
INTRAVENOUS | Status: AC
  Filled 2023-11-30: qty 5

## 2023-11-30 MED ORDER — INSULIN ASPART 100 UNIT/ML IJ SOLN
0.0000 [IU] | Freq: Three times a day (TID) | INTRAMUSCULAR | Status: DC
  Administered 2023-12-01: 8 [IU] via SUBCUTANEOUS

## 2023-11-30 MED ORDER — ONDANSETRON HCL 4 MG PO TABS: 4.0000 mg | ORAL_TABLET | Freq: Four times a day (QID) | ORAL | Status: DC | PRN

## 2023-11-30 MED ORDER — VANCOMYCIN HCL 1000 MG IV SOLR
INTRAVENOUS | Status: DC | PRN
  Administered 2023-11-30: 1000 mg via TOPICAL

## 2023-11-30 MED ORDER — TRANEXAMIC ACID-NACL 1000-0.7 MG/100ML-% IV SOLN
1000.0000 mg | Freq: Once | INTRAVENOUS | Status: AC
  Administered 2023-11-30: 1000 mg via INTRAVENOUS
  Filled 2023-11-30: qty 100

## 2023-11-30 MED ORDER — HYDROMORPHONE HCL 1 MG/ML IJ SOLN
0.2500 mg | INTRAMUSCULAR | Status: DC | PRN
  Administered 2023-11-30 (×3): 0.25 mg via INTRAVENOUS

## 2023-11-30 MED ORDER — ACETAMINOPHEN 500 MG PO TABS
500.0000 mg | ORAL_TABLET | Freq: Four times a day (QID) | ORAL | Status: DC
  Administered 2023-11-30 – 2023-12-01 (×3): 500 mg via ORAL
  Filled 2023-11-30 (×3): qty 1

## 2023-11-30 MED ORDER — PHENOL 1.4 % MT LIQD: 1.0000 | OROMUCOSAL | Status: DC | PRN

## 2023-11-30 MED ORDER — CEFAZOLIN SODIUM-DEXTROSE 2-4 GM/100ML-% IV SOLN
2.0000 g | Freq: Four times a day (QID) | INTRAVENOUS | Status: AC
  Administered 2023-11-30 – 2023-12-01 (×2): 2 g via INTRAVENOUS
  Filled 2023-11-30 (×2): qty 100

## 2023-11-30 MED ORDER — PHENYLEPHRINE 80 MCG/ML (10ML) SYRINGE FOR IV PUSH (FOR BLOOD PRESSURE SUPPORT)
PREFILLED_SYRINGE | INTRAVENOUS | Status: AC
  Filled 2023-11-30: qty 10

## 2023-11-30 MED ORDER — MIDAZOLAM HCL 2 MG/2ML IJ SOLN
INTRAMUSCULAR | Status: AC
  Administered 2023-11-30: 0.5 mg via INTRAVENOUS
  Filled 2023-11-30: qty 2

## 2023-11-30 SURGICAL SUPPLY — 68 items
BAG COUNTER SPONGE SURGICOUNT (BAG) ×1 IMPLANT
BIT DRILL 3.2XCALB NS DISP (BIT) IMPLANT
BIT DRILL 5/64X5 DISP (BIT) ×1 IMPLANT
BIT DRILL CALIBRATED 2.7 (BIT) IMPLANT
BLADE SAG 18X100X1.27 (BLADE) ×1 IMPLANT
CLSR STERI-STRIP ANTIMIC 1/2X4 (GAUZE/BANDAGES/DRESSINGS) IMPLANT
COVER SURGICAL LIGHT HANDLE (MISCELLANEOUS) ×1 IMPLANT
DRAPE IMP U-DRAPE 54X76 (DRAPES) ×2 IMPLANT
DRAPE INCISE IOBAN 66X45 STRL (DRAPES) ×1 IMPLANT
DRAPE SURG 17X23 STRL (DRAPES) ×1 IMPLANT
DRAPE SURG ORHT 6 SPLT 77X108 (DRAPES) ×2 IMPLANT
DRAPE U-SHAPE 47X51 STRL (DRAPES) ×1 IMPLANT
DRESSING AQUACEL AG SP 3.5X6 (GAUZE/BANDAGES/DRESSINGS) ×1 IMPLANT
DRSG AQUACEL AG ADV 3.5X 4 (GAUZE/BANDAGES/DRESSINGS) IMPLANT
DRSG AQUACEL AG ADV 3.5X 6 (GAUZE/BANDAGES/DRESSINGS) IMPLANT
DRSG AQUACEL AG ADV 3.5X10 (GAUZE/BANDAGES/DRESSINGS) ×1 IMPLANT
DURAPREP 26ML APPLICATOR (WOUND CARE) ×1 IMPLANT
ELECTRODE BLDE 4.0 EZ CLN MEGD (MISCELLANEOUS) ×1 IMPLANT
ELECTRODE REM PT RTRN 9FT ADLT (ELECTROSURGICAL) ×1 IMPLANT
FACESHIELD WRAPAROUND OR TEAM (MASK) ×1 IMPLANT
GAUZE PAD ABD 8X10 STRL (GAUZE/BANDAGES/DRESSINGS) ×1 IMPLANT
GLOVE BIO SURGEON STRL SZ7.5 (GLOVE) ×2 IMPLANT
GLOVE BIOGEL PI IND STRL 8 (GLOVE) ×2 IMPLANT
GOWN STRL REUS W/ TWL LRG LVL3 (GOWN DISPOSABLE) ×1 IMPLANT
GOWN STRL REUS W/ TWL XL LVL3 (GOWN DISPOSABLE) ×2 IMPLANT
KIT BASIN OR (CUSTOM PROCEDURE TRAY) ×1 IMPLANT
KIT TURNOVER KIT B (KITS) ×1 IMPLANT
KWIRE 2X5 SS THRDED S3 (WIRE) IMPLANT
MANIFOLD NEPTUNE II (INSTRUMENTS) ×1 IMPLANT
NDL 1/2 CIR CATGUT .05X1.09 (NEEDLE) IMPLANT
NDL 1/2 CIR MAYO (NEEDLE) ×1 IMPLANT
NDL HYPO 25GX1X1/2 BEV (NEEDLE) ×1 IMPLANT
NEEDLE 1/2 CIR CATGUT .05X1.09 (NEEDLE) ×1 IMPLANT
NEEDLE 1/2 CIR MAYO (NEEDLE) ×1 IMPLANT
NEEDLE HYPO 25GX1X1/2 BEV (NEEDLE) ×1 IMPLANT
NS IRRIG 1000ML POUR BTL (IV SOLUTION) ×1 IMPLANT
PACK SHOULDER (CUSTOM PROCEDURE TRAY) ×1 IMPLANT
PAD ARMBOARD POSITIONER FOAM (MISCELLANEOUS) ×2 IMPLANT
PEG LOCKING 3.2X32 (Peg) IMPLANT
PEG LOCKING 3.2X38 (Screw) IMPLANT
PEG LOCKING 3.2X40 (Peg) IMPLANT
PLATE 3HOLE HUMERUS PROX RT (Plate) IMPLANT
RESTRAINT HEAD UNIVERSAL NS (MISCELLANEOUS) ×1 IMPLANT
SCREW LOCK CORT STAR 3.5X24 (Screw) IMPLANT
SCREW LOCK CORT STAR 3.5X28 (Screw) IMPLANT
SCREW LOCK CORT STAR 3.5X42 (Screw) IMPLANT
SLEEVE MEASURING 3.2 (BIT) IMPLANT
SLING ARM FOAM STRAP LRG (SOFTGOODS) IMPLANT
SLING ARM IMMOBILIZER LRG (SOFTGOODS) IMPLANT
SLING ARM IMMOBILIZER MED (SOFTGOODS) IMPLANT
SPONGE T-LAP 18X18 ~~LOC~~+RFID (SPONGE) IMPLANT
SPONGE T-LAP 4X18 ~~LOC~~+RFID (SPONGE) ×1 IMPLANT
STRIP CLOSURE SKIN 1/2X4 (GAUZE/BANDAGES/DRESSINGS) ×1 IMPLANT
SUCTION TUBE FRAZIER 10FR DISP (SUCTIONS) ×1 IMPLANT
SUT MAXBRAID #2 CVD NDL (SUTURE) IMPLANT
SUT MNCRL AB 3-0 PS2 18 (SUTURE) ×1 IMPLANT
SUT MNCRL AB 4-0 PS2 18 (SUTURE) IMPLANT
SUT MON AB 2-0 CT1 36 (SUTURE) IMPLANT
SUT VIC AB 0 CT1 36 (SUTURE) IMPLANT
SUT VIC AB 1 CT1 27XBRD ANBCTR (SUTURE) IMPLANT
SUT VIC AB 2-0 CT1 TAPERPNT 27 (SUTURE) ×1 IMPLANT
SUTURE FIBERWR #2 38 T-5 BLUE (SUTURE) ×1 IMPLANT
SYR CONTROL 10ML LL (SYRINGE) ×1 IMPLANT
TOWEL GREEN STERILE (TOWEL DISPOSABLE) ×1 IMPLANT
TOWEL GREEN STERILE FF (TOWEL DISPOSABLE) ×1 IMPLANT
TOWER CARTRIDGE SMART MIX (DISPOSABLE) IMPLANT
WATER STERILE IRR 1000ML POUR (IV SOLUTION) ×1 IMPLANT
YANKAUER SUCT BULB TIP NO VENT (SUCTIONS) ×1 IMPLANT

## 2023-11-30 NOTE — Discharge Instructions (Signed)
 Orthopedic surgery discharge instructions:  -Maintain postoperative bandage until follow-up appointment.  This is waterproof, and you may begin showering on postoperative day #3.  Do not submerge underwater.  Maintain that bandage until your follow-up appointment in 2 weeks.  -No lifting over 2 pounds with operateive arm.  You may use the arm immediately for activities of daily living such as bathing, washing your face and brushing your teeth, eating, and getting dressed.  Otherwise maintain your sling when you are out of the house and sleeping.  -Apply ice liberally to the shoulder throughout the day.  For mild to moderate pain use Tylenol and Advil as needed around-the-clock.  For breakthrough pain use oxycodone as necessary.  -You will return to see Dr. Aundria Rud in the office in 2 weeks for routine postoperative check with x-rays.

## 2023-11-30 NOTE — Op Note (Signed)
 11/30/2023  4:00 PM  PATIENT:  Jennifer Ingram    PRE-OPERATIVE DIAGNOSIS:  Right shoulder proximal humerus fracture  POST-OPERATIVE DIAGNOSIS:  Same  PROCEDURE:  OPEN REDUCTION INTERNAL FIXATION (ORIF) PROXIMAL HUMERUS FRACTURE, RIGHT  SURGEON:  Selinda Belvie Gosling, MD  PHYSICIAN ASSISTANT: Dayle Moores, PA-C, present and scrubbed throughout the case, critical for completion in a timely fashion, and for retraction, instrumentation, and closure.  ANESTHESIA:   General With interscalene Exparel  PREOPERATIVE INDICATIONS:  [name] is a  73 y.o. female with a diagnosis of Right shoulder proximal humerus fracture who elected for surgical management.    The risks benefits and alternatives were discussed with the patient including but not limited to the risks of nonoperative treatment, versus surgical intervention including infection, bleeding, nerve injury, malunion, nonunion, the need for revision surgery, hardware prominence, hardware failure, the need for hardware removal, blood clots, cardiopulmonary complications, conversion to arthroplasty, morbidity, mortality, among others, and they were willing to proceed.  Predicted outcome is good, although there will be at least a six to nine month expected recovery.   OPERATIVE IMPLANTS: Biomet ALPS proximal humerus locking plate.  OPERATIVE FINDINGS: Displaced proximal humerus fracture.  UNIQUE ASPECTS OF THE CASE:   2 part displaced proximal humerus fracture.  Rotator cuff intact.  OPERATIVE PROCEDURE: The patient was brought to the operating room and placed in the supine position. General anesthesia was administered. IV antibiotics were given. She was placed in the beach chair position. All bony prominences were padded. The upper extremity was prepped and draped in usual sterile fashion. Deltopectoral incision was performed.  I exposed the fracture site, and placed deep retractors.  The biceps tendon was noted to be severed at the  level of the surgical neck fracture. I elevated a small portion of the deltoid off of the shaft, in order to gain access for the plate. I placed supraspinatus and subscapularis stitches, and then reduced the head onto the shaft. This was maintained in satisfactory position.  I applied the plate and secured it into the sliding hole first. I confirmed position of the reduction and the plate with C-arm, and I placed a total of 2 guidewires into the appropriate position in the head. I was satisfied that the plate was distal appropriately, and then secured the plate proximally with smooth pegs, taking care to prevent penetration into the arch articular surface, using C-arm, as well as manual feel using a hand drill.  I then secured the plate distally using another cortical screw. I then passed the FiberWire sutures from the subscapularis and supraspinatus through the plate and secured the tuberosities. Once complete fixation and reduction of been achieved, took final C-arm pictures, and irrigated the wounds copiously, and repaired the deltopectoral interval with Vicryl followed by Vicryl for the subcutaneous tissue with Monocryl and Steri-Strips for the skin. She was placed in a sling. She had a preoperative regional block as well. She tolerated the procedure well with no complications.   Disposition:  Ms. Fake will be admitted back to the orthopedic service postoperatively.  She will discharge from the hospital back to her skilled nursing facility.  Anticipate that could occur tomorrow or Sunday.  Otherwise she may range the shoulder as tolerated with active, active assisted and passive range of motion as tolerated to the shoulder.  No lifting over 2 pounds.

## 2023-11-30 NOTE — Transfer of Care (Signed)
 Immediate Anesthesia Transfer of Care Note  Patient: Jennifer Ingram  Procedure(s) Performed: OPEN REDUCTION INTERNAL FIXATION (ORIF) PROXIMAL HUMERUS FRACTURE, RIGHT (Right: Shoulder)  Patient Location: PACU  Anesthesia Type:General  Level of Consciousness: awake, alert , and oriented  Airway & Oxygen Therapy: Patient Spontanous Breathing and Patient connected to nasal cannula oxygen  Post-op Assessment: Report given to RN and Post -op Vital signs reviewed and stable  Post vital signs: Reviewed and stable  Last Vitals:  Vitals Value Taken Time  BP 141/77 11/30/23 16:36  Temp 37 C 11/30/23 16:36  Pulse 102 11/30/23 16:38  Resp 0 11/30/23 16:38  SpO2 93 % 11/30/23 16:38  Vitals shown include unfiled device data.  Last Pain:  Vitals:   11/30/23 1425  TempSrc:   PainSc: 0-No pain         Complications: No notable events documented.

## 2023-11-30 NOTE — Anesthesia Procedure Notes (Signed)
 Anesthesia Regional Block: Interscalene brachial plexus block   Pre-Anesthetic Checklist: , timeout performed,  Correct Patient, Correct Site, Correct Laterality,  Correct Procedure, Correct Position, site marked,  Risks and benefits discussed,  Surgical consent,  Pre-op evaluation,  At surgeon's request and post-op pain management  Laterality: Right and Upper  Prep: chloraprep       Needles:  Injection technique: Single-shot      Needle Length: 5cm  Needle Gauge: 22     Additional Needles: Arrow StimuQuik ECHO Echogenic Stimulating PNB Needle  Procedures:,,,, ultrasound used (permanent image in chart),,    Narrative:  Start time: 11/30/2023 2:04 PM End time: 11/30/2023 2:14 PM Injection made incrementally with aspirations every 5 mL.  Performed by: Personally  Anesthesiologist: Leopoldo Bruckner, MD

## 2023-11-30 NOTE — Anesthesia Postprocedure Evaluation (Signed)
 Anesthesia Post Note  Patient: TACHINA SPOONEMORE  Procedure(s) Performed: OPEN REDUCTION INTERNAL FIXATION (ORIF) PROXIMAL HUMERUS FRACTURE, RIGHT (Right: Shoulder)     Patient location during evaluation: PACU Anesthesia Type: Regional and General Level of consciousness: awake and alert, oriented and patient cooperative Pain management: pain level controlled Vital Signs Assessment: post-procedure vital signs reviewed and stable Respiratory status: spontaneous breathing, nonlabored ventilation and respiratory function stable Cardiovascular status: blood pressure returned to baseline and stable Postop Assessment: no apparent nausea or vomiting Anesthetic complications: no   No notable events documented.  Last Vitals:  Vitals:   11/30/23 1730 11/30/23 1745  BP: (!) 109/51 118/73  Pulse: 96 (!) 104  Resp: 12 18  Temp: 37 C 36.5 C  SpO2: 96% 94%    Last Pain:  Vitals:   11/30/23 1745  TempSrc: Oral  PainSc:                  Sinan Tuch,E. Weslie Rasmus

## 2023-11-30 NOTE — Brief Op Note (Signed)
 11/30/2023  3:59 PM  PATIENT:  Jennifer Ingram  73 y.o. female  PRE-OPERATIVE DIAGNOSIS:  Right shoulder proximal humerus fracture  POST-OPERATIVE DIAGNOSIS:  Right shoulder proximal humerus fracture  PROCEDURE:  Procedure(s): OPEN REDUCTION INTERNAL FIXATION (ORIF) PROXIMAL HUMERUS FRACTURE, RIGHT (Right)  SURGEON:  Surgeons and Role:    * Sharl Selinda Dover, MD - Primary  PHYSICIAN ASSISTANT: Dayle Moores, PA-C   ANESTHESIA:   regional and general  EBL:   150 cc  BLOOD ADMINISTERED:none  DRAINS: none   LOCAL MEDICATIONS USED:  NONE  SPECIMEN:  No Specimen  DISPOSITION OF SPECIMEN:  N/A  COUNTS:  YES  TOURNIQUET:  * No tourniquets in log *  DICTATION: .Note written in EPIC  PLAN OF CARE: Admit to inpatient   PATIENT DISPOSITION:  PACU - hemodynamically stable.   Delay start of Pharmacological VTE agent (>24hrs) due to surgical blood loss or risk of bleeding: not applicable

## 2023-11-30 NOTE — H&P (Signed)
 ORTHOPAEDIC H and P  REQUESTING PHYSICIAN: Sharl Selinda Dover, MD  PCP:  Patient, No Pcp Per  Chief Complaint: Right proximal humerus fracture  HPI: Jennifer Ingram is a 73 y.o. female who complains of right shoulder pain following a fall about a week and 1/2 to 2 weeks ago.  This resulted in a three-part proximal humerus fracture.  She unfortunately had displacement of the shaft fragment and is here today for open reduction and internal fixation.  No new complaints today.  Past Medical History:  Diagnosis Date   Arthritis    bilateral knees and back;   Depression    on meds, working well   Diabetes mellitus without complication (HCC) 05/31/2012   on meds   Heart palpitations    rapid - hx, no current problem per pt on 11/28/23   Hyperlipidemia    on meds   Hypertension    on meds   Hypothyroidism    Past Surgical History:  Procedure Laterality Date   APPENDECTOMY  1968   CESAREAN SECTION  1980   CHOLECYSTECTOMY  1985   TONSILLECTOMY  1960   Social History   Socioeconomic History   Marital status: Widowed    Spouse name: Not on file   Number of children: 1   Years of education: 80   Highest education level: 12th grade  Occupational History   Not on file  Tobacco Use   Smoking status: Never   Smokeless tobacco: Never  Vaping Use   Vaping status: Never Used  Substance and Sexual Activity   Alcohol use: No   Drug use: No   Sexual activity: Not Currently    Birth control/protection: Post-menopausal  Other Topics Concern   Not on file  Social History Narrative   Not on file   Social Drivers of Health   Financial Resource Strain: Not on file  Food Insecurity: No Food Insecurity (11/11/2023)   Hunger Vital Sign    Worried About Running Out of Food in the Last Year: Never true    Ran Out of Food in the Last Year: Never true  Transportation Needs: No Transportation Needs (11/11/2023)   PRAPARE - Administrator, Civil Service (Medical): No     Lack of Transportation (Non-Medical): No  Physical Activity: Sufficiently Active (10/10/2022)   Exercise Vital Sign    Days of Exercise per Week: 3 days    Minutes of Exercise per Session: 60 min  Stress: No Stress Concern Present (10/10/2022)   [employer] of Occupational Health - Occupational Stress Questionnaire    Feeling of Stress : Not at all  Social Connections: Moderately Integrated (11/11/2023)   Social Connection and Isolation Panel    Frequency of Communication with Friends and Family: More than three times a week    Frequency of Social Gatherings with Friends and Family: Once a week    Attends Religious Services: 1 to 4 times per year    Active Member of [employer] or Organizations: No    Attends Banker Meetings: 1 to 4 times per year    Marital Status: Widowed   Family History  Problem Relation Age of Onset   Cancer Mother        leukemia   Cancer Father        lung   Breast cancer Neg Hx    Colon polyps Neg Hx    Crohn's disease Neg Hx    Esophageal cancer Neg Hx    Stomach  cancer Neg Hx    Rectal cancer Neg Hx    No Known Allergies Prior to Admission medications   Medication Sig Start Date End Date Taking? Authorizing Provider  Accu-Chek Softclix Lancets lancets Check blood sugar 3 times a day 01/06/21   Susen Pastor, MD  Continuous Blood Gluc Receiver (FREESTYLE LIBRE 3 READER) DEVI USE AS DIRECTED 07/13/22   Masters, Izetta, DO  Continuous Glucose Sensor (FREESTYLE LIBRE 3 SENSOR) MISC PLACE SENSOR ON THE SKIN EVERY 14 DAYS TO CHECK GLUXOSE CONTINUOUSLY 07/30/23   Masters, Izetta, DO  EUTHYROX  50 MCG tablet Take 1 tablet by mouth once daily 10/18/23   Masters, Izetta, DO  gabapentin  (NEURONTIN ) 300 MG capsule Take 300 mg by mouth at bedtime. 07/28/23   [provider]  glucose blood (ACCU-CHEK GUIDE) test strip Check blood sugar 3 times per day 01/06/21   Susen Pastor, MD  HYDROcodone -acetaminophen  (NORCO/VICODIN) 5-325 MG tablet Take 1  tablet by mouth every 6 (six) hours as needed for moderate pain (pain score 4-6). 11/16/23   Briana Elgin LABOR, MD  insulin  isophane & regular human KwikPen (HUMULIN  70/30 KWIKPEN) (70-30) 100 UNIT/ML KwikPen Inject 10 Units into the skin 2 (two) times daily. 07/23/23   Will Almarie MATSU, MD  Insulin  Pen Needle (PEN NEEDLES) 31G X 5 MM MISC 1 each by Does not apply route daily. 12/05/22   Masters, Katie, DO  PARoxetine  (PAXIL ) 40 MG tablet TAKE 1 TABLET BY MOUTH ONCE DAILY AFTER BREAKFAST 07/23/22   Masters, Katie, DO  rosuvastatin  (CRESTOR ) 20 MG tablet Take 1 tablet (20 mg total) by mouth daily. 07/23/23   Will Almarie MATSU, MD   No results found.  Positive ROS: All other systems have been reviewed and were otherwise negative with the exception of those mentioned in the HPI and as above.  Physical Exam: General: Alert, no acute distress Cardiovascular: No pedal edema Respiratory: No cyanosis, no use of accessory musculature GI: No organomegaly, abdomen is soft and non-tender Skin: No lesions in the area of chief complaint Neurologic: Sensation intact distally Psychiatric: Patient is competent for consent with normal mood and affect Lymphatic: No axillary or cervical lymphadenopathy  MUSCULOSKELETAL: Right upper extremity:  Resolving ecchymosis noted in the axilla consistent with fracture.  Otherwise neurovascular intact.  Assessment: Right three-part proximal humerus fracture  Plan: Plan to proceed today with open reduction internal fixation versus possible reverse arthroplasty if she has rotator cuff deficiency. The risks, benefits, and alternatives were discussed with the patient. There are risks associated with the surgery including, but not limited to, problems with anesthesia (death), infection, differences in leg length/angulation/rotation, fracture of bones, loosening or failure of implants, malunion, nonunion, hematoma (blood accumulation) which may require surgical drainage,  blood clots, pulmonary embolism, nerve injury (foot drop), and blood vessel injury. The patient understands these risks and elects to proceed.   Plan will be to admit postoperatively for observation versus potentially admission to prepare for SNF placement.    Selinda Belvie Gosling, MD Cell [phone]    11/30/2023 12:39 PM

## 2023-11-30 NOTE — Plan of Care (Signed)

## 2023-11-30 NOTE — Anesthesia Procedure Notes (Signed)
 Procedure Name: Intubation Date/Time: 11/30/2023 2:47 PM  Performed by: Madisun Hargrove J, CRNAPre-anesthesia Checklist: Patient identified, Emergency Drugs available, Suction available and Patient being monitored Patient Re-evaluated:Patient Re-evaluated prior to induction Oxygen Delivery Method: Circle System Utilized Preoxygenation: Pre-oxygenation with 100% oxygen Induction Type: IV induction Ventilation: Mask ventilation without difficulty Laryngoscope Size: Miller and 2 Grade View: Grade I Tube type: Oral Tube size: 7.0 mm Number of attempts: 1 Airway Equipment and Method: Stylet and Oral airway Placement Confirmation: ETT inserted through vocal cords under direct vision, positive ETCO2 and breath sounds checked- equal and bilateral Tube secured with: Tape Dental Injury: Teeth and Oropharynx as per pre-operative assessment

## 2023-12-01 DIAGNOSIS — E785 Hyperlipidemia, unspecified: Secondary | ICD-10-CM | POA: Diagnosis not present

## 2023-12-01 DIAGNOSIS — Z743 Need for continuous supervision: Secondary | ICD-10-CM | POA: Diagnosis not present

## 2023-12-01 DIAGNOSIS — E119 Type 2 diabetes mellitus without complications: Secondary | ICD-10-CM | POA: Diagnosis not present

## 2023-12-01 DIAGNOSIS — E039 Hypothyroidism, unspecified: Secondary | ICD-10-CM | POA: Diagnosis not present

## 2023-12-01 DIAGNOSIS — I499 Cardiac arrhythmia, unspecified: Secondary | ICD-10-CM | POA: Diagnosis not present

## 2023-12-01 DIAGNOSIS — Z7401 Bed confinement status: Secondary | ICD-10-CM | POA: Diagnosis not present

## 2023-12-01 DIAGNOSIS — Z794 Long term (current) use of insulin: Secondary | ICD-10-CM | POA: Diagnosis not present

## 2023-12-01 DIAGNOSIS — Z79899 Other long term (current) drug therapy: Secondary | ICD-10-CM | POA: Diagnosis not present

## 2023-12-01 DIAGNOSIS — S42201A Unspecified fracture of upper end of right humerus, initial encounter for closed fracture: Secondary | ICD-10-CM | POA: Diagnosis not present

## 2023-12-01 DIAGNOSIS — I10 Essential (primary) hypertension: Secondary | ICD-10-CM | POA: Diagnosis not present

## 2023-12-01 LAB — BASIC METABOLIC PANEL WITH GFR
Anion gap: 15 (ref 5–15)
BUN: 22 mg/dL (ref 8–23)
CO2: 21 mmol/L — ABNORMAL LOW (ref 22–32)
Calcium: 8.3 mg/dL — ABNORMAL LOW (ref 8.9–10.3)
Chloride: 101 mmol/L (ref 98–111)
Creatinine, Ser: 1.16 mg/dL — ABNORMAL HIGH (ref 0.44–1.00)
GFR, Estimated: 50 mL/min — ABNORMAL LOW (ref >60)
Glucose, Bld: 360 mg/dL — ABNORMAL HIGH (ref 70–99)
Potassium: 3.6 mmol/L (ref 3.5–5.1)
Sodium: 137 mmol/L (ref 135–145)

## 2023-12-01 LAB — GLUCOSE, CAPILLARY
Glucose-Capillary: 291 mg/dL — ABNORMAL HIGH (ref 70–99)
Glucose-Capillary: 364 mg/dL — ABNORMAL HIGH (ref 70–99)

## 2023-12-01 LAB — HEMOGLOBIN AND HEMATOCRIT, BLOOD
HCT: 34 % — ABNORMAL LOW (ref 36.0–46.0)
Hemoglobin: 11.4 g/dL — ABNORMAL LOW (ref 12.0–15.0)

## 2023-12-01 MED ORDER — OXYCODONE HCL 5 MG PO TABS: 5.0000 mg | ORAL_TABLET | ORAL | 0 refills | Status: DC | PRN

## 2023-12-01 MED ORDER — ONDANSETRON 4 MG PO TBDP: 4.0000 mg | ORAL_TABLET | Freq: Three times a day (TID) | ORAL | 0 refills | Status: DC | PRN

## 2023-12-01 MED ORDER — CHLORHEXIDINE GLUCONATE CLOTH 2 % EX PADS
6.0000 | MEDICATED_PAD | Freq: Every day | CUTANEOUS | Status: DC
  Administered 2023-12-01: 6 via TOPICAL

## 2023-12-01 MED ORDER — MUPIROCIN 2 % EX OINT
1.0000 | TOPICAL_OINTMENT | Freq: Two times a day (BID) | CUTANEOUS | Status: DC
  Administered 2023-12-01: 1 via NASAL
  Filled 2023-12-01: qty 22

## 2023-12-01 NOTE — Discharge Summary (Signed)
 In most cases prophylactic antibiotics for Dental procdeures after total joint surgery are not necessary.  Exceptions are as follows:  1. History of prior total joint infection  2. Severely immunocompromised (Organ Transplant, cancer chemotherapy, Rheumatoid biologic meds such as Humera)  3. Poorly controlled diabetes (A1C &gt; 8.0, blood glucose over 200)  If you have one of these conditions, contact your surgeon for an antibiotic prescription, prior to your dental procedure. Orthopedic Discharge Summary        Physician Discharge Summary  Patient ID: Jennifer Ingram MRN: [id number] DOB/AGE: 1951-03-20 73 y.o.  Admit date: 11/30/2023 Discharge date: 12/01/2023   Procedures:  Procedure(s) (LRB): OPEN REDUCTION INTERNAL FIXATION (ORIF) PROXIMAL HUMERUS FRACTURE, RIGHT (Right)  Attending Physician:  Dr. Selinda Gosling  Admission Diagnoses:   Right proximal humerus fracture  Discharge Diagnoses:  same   Past Medical History:  Diagnosis Date   Arthritis    bilateral knees and back;   Depression    on meds, working well   Diabetes mellitus without complication (HCC) 05/31/2012   on meds   Heart palpitations    rapid - hx, no current problem per pt on 11/28/23   Hyperlipidemia    on meds   Hypertension    on meds   Hypothyroidism     PCP: System, Provider Not In   Discharged Condition: stable  Hospital Course:  Patient underwent the above stated procedure on 11/30/2023. Patient tolerated the procedure well and brought to the recovery room in good condition and subsequently to the floor. Patient had an uncomplicated hospital course and was stable for discharge.   Disposition: Discharge disposition: 03-Skilled Nursing Facility      with follow up in 2 weeks    Follow-up Information     Gosling Selinda Dover, MD Follow up in 2 week(s).   Specialty: Orthopedic Surgery Why: For wound re-check Contact information: [address] [place]  [place] [address] [phone]                 Dental Antibiotics:  In most cases prophylactic antibiotics for Dental procdeures after total joint surgery are not necessary.  Exceptions are as follows:  1. History of prior total joint infection  2. Severely immunocompromised (Organ Transplant, cancer chemotherapy, Rheumatoid biologic meds such as Humera)  3. Poorly controlled diabetes (A1C &gt; 8.0, blood glucose over 200)  If you have one of these conditions, contact your surgeon for an antibiotic prescription, prior to your dental procedure.  Discharge Instructions     Call MD / Call 911   Complete by: As directed    If you experience chest pain or shortness of breath, CALL 911 and be transported to the hospital emergency room.  If you develope a fever above 101 F, pus (white drainage) or increased drainage or redness at the wound, or calf pain, call your surgeon's office.   Constipation Prevention   Complete by: As directed    Drink plenty of fluids.  Prune juice may be helpful.  You may use a stool softener, such as Colace (over the counter) 100 mg twice a day.  Use MiraLax (over the counter) for constipation as needed.   Diet - low sodium heart healthy   Complete by: As directed    Increase activity slowly as tolerated   Complete by: As directed    Post-operative opioid taper instructions:   Complete by: As directed    POST-OPERATIVE OPIOID TAPER INSTRUCTIONS: It is important to wean off  of your opioid medication as soon as possible. If you do not need pain medication after your surgery it is ok to stop day one. Opioids include: Codeine, Hydrocodone (Norco, Vicodin), Oxycodone (Percocet, oxycontin ) and hydromorphone  amongst others.  Long term and even short term use of opiods can cause: Increased pain response Dependence Constipation Depression Respiratory depression And more.  Withdrawal symptoms can include Flu like symptoms Nausea, vomiting And more Techniques  to manage these symptoms Hydrate well Eat regular healthy meals Stay active Use relaxation techniques(deep breathing, meditating, yoga) Do Not substitute Alcohol to help with tapering If you have been on opioids for less than two weeks and do not have pain than it is ok to stop all together.  Plan to wean off of opioids This plan should start within one week post op of your joint replacement. Maintain the same interval or time between taking each dose and first decrease the dose.  Cut the total daily intake of opioids by one tablet each day Next start to increase the time between doses. The last dose that should be eliminated is the evening dose.          Allergies as of 12/01/2023   No Known Allergies      Medication List     TAKE these medications    Accu-Chek Guide test strip Generic drug: glucose blood Check blood sugar 3 times per day   Accu-Chek Softclix Lancets lancets Check blood sugar 3 times a day   Euthyrox  50 MCG tablet Generic drug: levothyroxine  Take 1 tablet by mouth once daily   FreeStyle Libre 3 Reader Espiridion USE AS DIRECTED   FreeStyle Libre 3 Sensor Misc PLACE SENSOR ON THE SKIN EVERY 14 DAYS TO CHECK GLUXOSE CONTINUOUSLY   gabapentin  300 MG capsule Commonly known as: NEURONTIN  Take 300 mg by mouth at bedtime.   HumuLIN  70/30 KwikPen (70-30) 100 UNIT/ML KwikPen Generic drug: insulin  isophane & regular human KwikPen Inject 10 Units into the skin 2 (two) times daily.   HYDROcodone -acetaminophen  5-325 MG tablet Commonly known as: NORCO/VICODIN Take 1 tablet by mouth every 6 (six) hours as needed for moderate pain (pain score 4-6).   ondansetron  4 MG disintegrating tablet Commonly known as: ZOFRAN -ODT Take 1 tablet (4 mg total) by mouth every 8 (eight) hours as needed for nausea or vomiting.   oxyCODONE  5 MG immediate release tablet Commonly known as: Roxicodone  Take 1 tablet (5 mg total) by mouth every 4 (four) hours as needed for moderate  pain (pain score 4-6).   PARoxetine  40 MG tablet Commonly known as: PAXIL  TAKE 1 TABLET BY MOUTH ONCE DAILY AFTER BREAKFAST   Pen Needles 31G X 5 MM Misc 1 each by Does not apply route daily.   rosuvastatin  20 MG tablet Commonly known as: CRESTOR  Take 1 tablet (20 mg total) by mouth daily.          Signed: Elspeth JONELLE Her 12/01/2023, 7:13 AM  [facility] is now [facility] [address], [address], [place], [place] [address] Phone: [phone] Facebook  Instagram  [employer]

## 2023-12-01 NOTE — Progress Notes (Signed)
 Report called to [facility]. [facility] provided transportation. [facility] was unable to take patient's wheelchair, facility is aware. IV removed. AVS printed and added to discharge packet.

## 2023-12-01 NOTE — Progress Notes (Signed)
 Orthopedics Progress Note  Subjective: Patient feels much better today. No pain in the shoulder  Objective:  Vitals:   11/30/23 2300 12/01/23 0430  BP: 117/76 129/81  Pulse: 94 95  Resp: 17 17  Temp: 97.6 F (36.4 C) 97.9 F (36.6 C)  SpO2:  97%    General: Awake and alert  Musculoskeletal: Right shoulder with Aquacel dressing in place. Moderate swelling and bruising. Wiggles fingers. Sensation intact Neurovascularly intact  Lab Results  Component Value Date   WBC 13.4 (H) 11/30/2023   HGB 14.1 11/30/2023   HCT 42.8 11/30/2023   MCV 92.2 11/30/2023   PLT 304 11/30/2023       Component Value Date/Time   NA 139 11/30/2023 1323   NA 142 10/10/2022 1041   K 3.5 11/30/2023 1323   CL 104 11/30/2023 1323   CO2 25 11/30/2023 1323   GLUCOSE 293 (H) 11/30/2023 1323   BUN 10 11/30/2023 1323   BUN 31 (H) 10/10/2022 1041   CREATININE 0.96 11/30/2023 1323   CALCIUM  8.5 (L) 11/30/2023 1323   GFRNONAA >60 11/30/2023 1323   GFRAA >60 11/06/2016 1358    Lab Results  Component Value Date   INR 1.0 11/11/2023    Assessment/Plan: POD #1 s/p Procedure(s): OPEN REDUCTION INTERNAL FIXATION (ORIF) PROXIMAL HUMERUS FRACTURE, RIGHT Stable overnight D/C to SNF today NWB Right UE Maintain sling Follow up with Dr Sharl in two weeks  Elspeth SAUNDERS. Kay, MD 12/01/2023 7:09 AM

## 2023-12-01 NOTE — Care Management Obs Status (Signed)
 MEDICARE OBSERVATION STATUS NOTIFICATION   Patient Details  Name: Jennifer Ingram MRN: [id number] Date of Birth: [birth year]   Medicare Observation Status Notification Given:  Yes  Patient unable to physically sign due to right arm being in arm sling.  Robynn Eileen Hoose, RN 12/01/2023, 8:49 AM

## 2023-12-01 NOTE — TOC Transition Note (Signed)
 Transition of Care [facility]) - Discharge Note   Patient Details  Name: Jennifer Ingram MRN: [id number] Date of Birth: 1950/08/08  [facility] CM/SW Contact:  Bridget Cordella Simmonds, LCSW Phone Number: 12/01/2023, 10:36 AM   Clinical Narrative:   Pt discharging to [facility], room 907P.  RN call report to [phone].  [facility] called [phone].   0930: CSW informed that pt stable for DC.  Pt admitted from [facility].  CSW message with [facility]: they can receive pt today, no new auth needed. CSW spoke with OT, pt very unsteady, recommending ambulance transport to SNF.      Final next level of care: Skilled Nursing Facility Barriers to Discharge: No Barriers Identified   Patient Goals and CMS Choice            Discharge Placement              Patient chooses bed at: [facility] Patient to be transferred to facility by: [facility] Name of family member notified: brother Rosalita, unable to reach son Patient and family notified of of transfer: 12/01/23  Discharge Plan and Services Additional resources added to the After Visit Summary for                                       Social Drivers of Health (SDOH) Interventions SDOH Screenings   Food Insecurity: No Food Insecurity (11/11/2023)  Housing: Low Risk  (11/11/2023)  Transportation Needs: No Transportation Needs (11/11/2023)  Utilities: Not At Risk (11/11/2023)  Alcohol Screen: Low Risk  (10/10/2022)  Depression (PHQ2-9): Low Risk  (10/10/2022)  Physical Activity: Sufficiently Active (10/10/2022)  Social Connections: Moderately Integrated (11/11/2023)  Stress: No Stress Concern Present (10/10/2022)  Tobacco Use: Low Risk  (11/30/2023)     Readmission Risk Interventions    11/16/2023   10:50 AM 11/12/2023    4:30 PM 07/21/2023    9:11 AM  Readmission Risk Prevention Plan  Post Dischage Appt  Complete   Medication Screening  Complete   Transportation Screening Complete Complete Complete  PCP or Specialist Appt  within 5-7 Days Complete  Complete  Home Care Screening Complete  Complete  Medication Review (RN CM) Complete  Complete

## 2023-12-01 NOTE — Evaluation (Signed)
 Occupational Therapy Evaluation Patient Details Name: Jennifer Ingram MRN: [id number] DOB: [birth year] Today's Date: 12/01/2023   History of Present Illness   Pt is 73 yo female presented to [facility] on 8/1 for planned R humerus ORIF performed 8/1 secondary to R proximal humerus fx sustained in fall on 7/13 after syncope and collapse. Pt with hx including but not limited to T2DM, HLD, HTN, CKD stage 3b, hypothyroidism, depression.  Noted earlier hospital admission for orthostatic hypotension and pt also reports hx of vertigo.     Clinical Impressions At baseline, prior to fall on 7/13, pt is Ind with ADLs, In with functional mobility without an AD, and receives PRN assistance for IADLs from her son. Pt now presents with decreased activity tolerance dizziness and pt reported signs of vertigo upon standing, decreased functional use of dominant R UE, B UE generalized weakness (tested within precautions), decreased balance, and decreased safety and independence with functional tasks. Pt currently demonstrates ability to complete ADLs largely with Set up to Max assist, bed mobility with Mod to Max assist, and STS transfer with HHA +1 with Min assist, all while adhering to R UE precautions. Pt able to take two side steps at EOB toward [facility] with Min assist, but unable to progress mobility further due to pt needing to return to supine secondary to significant dizziness and pt reported signs of vertigo in standing. OT educated pt in R UE precautions, donning/doffing R UE sling, and initiated education in compensatory strategies for completing ADLs while adhering to R UE precautions with pt verbalizing understanding of all training. Pt will benefit from reinforcement of training. Plan for pt to return to [facility] later today to continue receiving short-term rehab. If pt does not discharge as planned, she will benefit from acute skilled OT services and from acute PT consult.      If plan is discharge home,  recommend the following:   A little help with walking and/or transfers;A lot of help with bathing/dressing/bathroom;Assistance with cooking/housework;Assist for transportation;Help with stairs or ramp for entrance     Functional Status Assessment   Patient has had a recent decline in their functional status and demonstrates the ability to make significant improvements in function in a reasonable and predictable amount of time.     Equipment Recommendations   Other (comment) (defer to next level of care)     Recommendations for Other Services   PT consult     Precautions/Restrictions   Precautions Precautions: Fall;Shoulder Type of Shoulder Precautions: R UE NWB; AAROM/PROM of R shoulder; not restrictions on AROM of R elbow, wrist, and hand Shoulder Interventions: Shoulder sling/immobilizer (R UE) Recall of Precautions/Restrictions: Intact Rheba reviewed precautions with pt demonstrating understanding through teach back) Precaution/Restrictions Comments: R UE sling; watch BP Required Braces or Orthoses: Sling (R UE) Restrictions Weight Bearing Restrictions Per Provider Order: Yes RUE Weight Bearing Per Provider Order: Non weight bearing Other Position/Activity Restrictions: AAROM/PROM of R shoulder; not restrictions on AROM of R elbow, wrist, and hand     Mobility Bed Mobility Overal bed mobility: Needs Assistance Bed Mobility: Supine to Sit, Sit to Supine   Sidelying to sit: Mod assist, HOB elevated   Sit to supine: Max assist, HOB elevated   General bed mobility comments: Sit to supine limited by pt dizziness    Transfers Overall transfer level: Needs assistance Equipment used: 1 person hand held assist Transfers: Sit to/from Stand Sit to Stand: Min assist  General transfer comment: Pt unable to perform bed <-> chair transfer secondary to singificnat dizziness upon standing. Pt able to take 2 side steps at EOB with Min assist prior to  needing to sit secondary to dizziness and pt report of symptoms of vertigo      Balance Overall balance assessment: Needs assistance Sitting-balance support: Single extremity supported, Feet supported Sitting balance-Leahy Scale: Fair     Standing balance support: Single extremity supported, During functional activity Standing balance-Leahy Scale: Poor Standing balance comment: Reliant on Min assist of therapist for static standing balance                           ADL either performed or assessed with clinical judgement   ADL Overall ADL's : Needs assistance/impaired Eating/Feeding: Set up;Sitting Eating/Feeding Details (indicate cue type and reason): primarily using non-dominant L hand Grooming: Set up;Minimal assistance;Adhering to UE precautions;Sitting Grooming Details (indicate cue type and reason): primarily using non-dominant L hand Upper Body Bathing: Moderate assistance;Cueing for compensatory techniques;Adhering to UE precautions;Standing   Lower Body Bathing: Maximal assistance;Cueing for compensatory techniques;Sitting/lateral leans   Upper Body Dressing : Moderate assistance;Cueing for compensatory techniques;Adhering to UE precautions;Sitting   Lower Body Dressing: Maximal assistance;Sitting/lateral leans;Cueing for compensatory techniques     Toilet Transfer Details (indicate cue type and reason): pt unable this day secondary to significant dizziness and pt reports symptoms of vertigo upon standing Toileting- Clothing Manipulation and Hygiene: Maximal assistance;Bed level;Cueing for compensatory techniques (adhering to R UE precautions)         General ADL Comments: Pt functional level significantly impacted by dizziness upon standing and decreased activity tolerance with pt fatiguing quickly during functional tasks. OT educated pt in donning/doffing R UE sling with pt demonstrating ability to doff sling with Mod assist and donn sling with Max assist.      Vision Baseline Vision/History: 1 Wears glasses (readers) Ability to See in Adequate Light: 0 Adequate (with readers) Patient Visual Report: No change from baseline       Perception         Praxis         Pertinent Vitals/Pain Pain Assessment Pain Assessment: Faces Faces Pain Scale: Hurts little more Pain Location: R shoulder Pain Descriptors / Indicators: Grimacing, Discomfort, Sore Pain Intervention(s): Limited activity within patient's tolerance, Monitored during session, Repositioned     Extremity/Trunk Assessment Upper Extremity Assessment Upper Extremity Assessment: Right hand dominant;Generalized weakness;RUE deficits/detail;LUE deficits/detail RUE Deficits / Details: pt s/p R humerus ORIF performed 8/1 secondary to R proximal humerus fx sustained in fall on 7/13; generalized weakness (tested within precautions); edematous throughout UE worse from shoulder to elbow; AROM of hand and wrist WFL; AROM of elbow limited by edema; PROM shoulder flexion to approximately 80 degrees RUE Sensation: WNL RUE Coordination: decreased gross motor LUE Deficits / Details: generalized weakness LUE Sensation: WNL LUE Coordination: WNL   Lower Extremity Assessment Lower Extremity Assessment: Generalized weakness   Cervical / Trunk Assessment Cervical / Trunk Assessment: Kyphotic   Communication Communication Communication: No apparent difficulties   Cognition Arousal: Alert Behavior During Therapy: WFL for tasks assessed/performed Cognition: No apparent impairments             OT - Cognition Comments: Pt AAOx4 and pleasant throughout session. Cogntiion [facility] for tasks assessed. Cognition not formally screened or evaluated                 Following commands: Intact  Cueing  General Comments   Cueing Techniques: Verbal cues  OT educated pt in imporved positioning of R UE in the bed and in sitting to increase support and comfort with pt verbalizing  understanding.   Exercises     Shoulder Instructions      Home Living Family/patient expects to be discharged to:: Skilled nursing facility Living Arrangements: Children (son) Available Help at Discharge: Family;Available PRN/intermittently (son works part-time outside the home) Type of Home: Mobile home Home Access: Stairs to enter Secretary/administrator of Steps: 4 Entrance Stairs-Rails: Right;Left Home Layout: One level     Bathroom Shower/Tub: Tub/shower unit         Home Equipment: Cane - single Librarian, academic (2 wheels)   Additional Comments: Pt typically lives with her son. However, she has been at [facility] for short-term rehab for approximately 1.5 weeks after syncope and fall on 7/13. Home set-up is for pt's house as pt plans to return home with son following short-term rehab.      Prior Functioning/Environment Prior Level of Function : Independent/Modified Independent             Mobility Comments: pt reports ind with community amb, did not use AD,  no recent falls other than this one associated with syncope ADLs Comments: pt reports ind with ADLs/IADLs    OT Problem List: Decreased strength;Decreased range of motion;Decreased activity tolerance;Impaired balance (sitting and/or standing);Decreased coordination;Decreased knowledge of use of DME or AE;Decreased knowledge of precautions;Increased edema;Impaired UE functional use   OT Treatment/Interventions: Self-care/ADL training;Therapeutic exercise;Energy conservation;DME and/or AE instruction;Therapeutic activities;Patient/family education;Balance training      OT Goals(Current goals can be found in the care plan section)   Acute Rehab OT Goals Patient Stated Goal: to not be dizzy, heal well, and return to [facility] for short-term rehab OT Goal Formulation: With patient Time For Goal Achievement: 12/15/23 Potential to Achieve Goals: Good ADL Goals Pt Will Perform Grooming: with modified  independence;sitting (with adaptive equipment as needed; adhering to R UE precautions) Pt Will Perform Upper Body Bathing: with contact guard assist;sitting (with adaptive equipment as needed; adhering to R UE precautions) Pt Will Perform Lower Body Bathing: with min assist;sitting/lateral leans;sit to/from stand (with adaptive equipment as needed; adhering to R UE precautions) Pt Will Perform Upper Body Dressing: with contact guard assist;sitting (adhering to R UE precautions) Pt Will Perform Lower Body Dressing: with min assist;sitting/lateral leans;sit to/from stand (adhering to R UE precautions) Pt Will Transfer to Toilet: with contact guard assist;ambulating;regular height toilet;grab bars (with least restrictive AD; adhering to R UE precautions) Pt Will Perform Toileting - Clothing Manipulation and hygiene: with min assist;sitting/lateral leans;sit to/from stand (adhering to R UE precautions)   OT Frequency:  Min 2X/week    Co-evaluation              AM-PAC OT 6 Clicks Daily Activity     Outcome Measure Help from another person eating meals?: A Little Help from another person taking care of personal grooming?: A Little Help from another person toileting, which includes using toliet, bedpan, or urinal?: A Lot Help from another person bathing (including washing, rinsing, drying)?: A Lot Help from another person to put on and taking off regular upper body clothing?: A Lot Help from another person to put on and taking off regular lower body clothing?: A Lot 6 Click Score: 14   End of Session Equipment Utilized During Treatment: Gait belt;Other (comment) (R UE sling) Nurse Communication: Mobility status;Other (comment) (Pt with significant  dizziness upon standing and reporting signs of vertigo. Pt able to take 2 side-steps at EOB with Min assist and HHA +1 then needing to return to supine due to dizziness. Also discussed RN plan for taking orthos.)  Activity Tolerance: Other  (comment);Patient tolerated treatment well (Pt limited by dizziness and pt reported signs of vertigo in staanding.) Patient left: in bed;with call bell/phone within reach;with bed alarm set  OT Visit Diagnosis: Unsteadiness on feet (R26.81);Other abnormalities of gait and mobility (R26.89);History of falling (Z91.81);Muscle weakness (generalized) (M62.81)                Time: 9069-9041 OT Time Calculation (min): 28 min Charges:  OT General Charges $OT Visit: 1 Visit OT Evaluation $OT Eval Moderate Complexity: 1 Mod OT Treatments $Self Care/Home Management : 8-22 mins  Margarie Rockey HERO., OTR/L, MA Acute Rehab [phone]   Margarie FORBES Horns 12/01/2023, 11:30 AM

## 2023-12-03 ENCOUNTER — Encounter (HOSPITAL_COMMUNITY): Payer: Self-pay | Admitting: Orthopedic Surgery

## 2023-12-03 DIAGNOSIS — Z9181 History of falling: Secondary | ICD-10-CM | POA: Diagnosis not present

## 2023-12-03 DIAGNOSIS — N1832 Chronic kidney disease, stage 3b: Secondary | ICD-10-CM | POA: Diagnosis not present

## 2023-12-03 DIAGNOSIS — S42221D 2-part displaced fracture of surgical neck of right humerus, subsequent encounter for fracture with routine healing: Secondary | ICD-10-CM | POA: Diagnosis not present

## 2023-12-03 DIAGNOSIS — R2681 Unsteadiness on feet: Secondary | ICD-10-CM | POA: Diagnosis not present

## 2023-12-03 DIAGNOSIS — M6281 Muscle weakness (generalized): Secondary | ICD-10-CM | POA: Diagnosis not present

## 2023-12-03 DIAGNOSIS — E1165 Type 2 diabetes mellitus with hyperglycemia: Secondary | ICD-10-CM | POA: Diagnosis not present

## 2023-12-03 DIAGNOSIS — Z794 Long term (current) use of insulin: Secondary | ICD-10-CM | POA: Diagnosis not present

## 2023-12-03 DIAGNOSIS — E039 Hypothyroidism, unspecified: Secondary | ICD-10-CM | POA: Diagnosis not present

## 2023-12-03 DIAGNOSIS — I129 Hypertensive chronic kidney disease with stage 1 through stage 4 chronic kidney disease, or unspecified chronic kidney disease: Secondary | ICD-10-CM | POA: Diagnosis not present

## 2023-12-03 DIAGNOSIS — E1122 Type 2 diabetes mellitus with diabetic chronic kidney disease: Secondary | ICD-10-CM | POA: Diagnosis not present

## 2023-12-03 DIAGNOSIS — I951 Orthostatic hypotension: Secondary | ICD-10-CM | POA: Diagnosis not present

## 2023-12-05 DIAGNOSIS — I951 Orthostatic hypotension: Secondary | ICD-10-CM | POA: Diagnosis not present

## 2023-12-05 DIAGNOSIS — S42221D 2-part displaced fracture of surgical neck of right humerus, subsequent encounter for fracture with routine healing: Secondary | ICD-10-CM | POA: Diagnosis not present

## 2023-12-05 DIAGNOSIS — Z9181 History of falling: Secondary | ICD-10-CM | POA: Diagnosis not present

## 2023-12-05 DIAGNOSIS — R2681 Unsteadiness on feet: Secondary | ICD-10-CM | POA: Diagnosis not present

## 2023-12-05 DIAGNOSIS — M6281 Muscle weakness (generalized): Secondary | ICD-10-CM | POA: Diagnosis not present

## 2023-12-10 DIAGNOSIS — Z9181 History of falling: Secondary | ICD-10-CM | POA: Diagnosis not present

## 2023-12-10 DIAGNOSIS — S42221D 2-part displaced fracture of surgical neck of right humerus, subsequent encounter for fracture with routine healing: Secondary | ICD-10-CM | POA: Diagnosis not present

## 2023-12-10 DIAGNOSIS — Z794 Long term (current) use of insulin: Secondary | ICD-10-CM | POA: Diagnosis not present

## 2023-12-10 DIAGNOSIS — M6281 Muscle weakness (generalized): Secondary | ICD-10-CM | POA: Diagnosis not present

## 2023-12-10 DIAGNOSIS — E1122 Type 2 diabetes mellitus with diabetic chronic kidney disease: Secondary | ICD-10-CM | POA: Diagnosis not present

## 2023-12-10 DIAGNOSIS — S42201D Unspecified fracture of upper end of right humerus, subsequent encounter for fracture with routine healing: Secondary | ICD-10-CM | POA: Diagnosis not present

## 2023-12-10 DIAGNOSIS — E039 Hypothyroidism, unspecified: Secondary | ICD-10-CM | POA: Diagnosis not present

## 2023-12-10 DIAGNOSIS — R2681 Unsteadiness on feet: Secondary | ICD-10-CM | POA: Diagnosis not present

## 2023-12-10 DIAGNOSIS — Z7189 Other specified counseling: Secondary | ICD-10-CM | POA: Diagnosis not present

## 2023-12-10 DIAGNOSIS — I951 Orthostatic hypotension: Secondary | ICD-10-CM | POA: Diagnosis not present

## 2023-12-10 DIAGNOSIS — I350 Nonrheumatic aortic (valve) stenosis: Secondary | ICD-10-CM | POA: Diagnosis not present

## 2023-12-10 DIAGNOSIS — N1832 Chronic kidney disease, stage 3b: Secondary | ICD-10-CM | POA: Diagnosis not present

## 2023-12-12 DIAGNOSIS — S42221D 2-part displaced fracture of surgical neck of right humerus, subsequent encounter for fracture with routine healing: Secondary | ICD-10-CM | POA: Diagnosis not present

## 2023-12-12 DIAGNOSIS — M6281 Muscle weakness (generalized): Secondary | ICD-10-CM | POA: Diagnosis not present

## 2023-12-12 DIAGNOSIS — Z9181 History of falling: Secondary | ICD-10-CM | POA: Diagnosis not present

## 2023-12-12 DIAGNOSIS — I951 Orthostatic hypotension: Secondary | ICD-10-CM | POA: Diagnosis not present

## 2023-12-12 DIAGNOSIS — R2681 Unsteadiness on feet: Secondary | ICD-10-CM | POA: Diagnosis not present

## 2023-12-12 DIAGNOSIS — Z4789 Encounter for other orthopedic aftercare: Secondary | ICD-10-CM | POA: Diagnosis not present

## 2023-12-14 DIAGNOSIS — N1832 Chronic kidney disease, stage 3b: Secondary | ICD-10-CM | POA: Diagnosis not present

## 2023-12-14 DIAGNOSIS — E039 Hypothyroidism, unspecified: Secondary | ICD-10-CM | POA: Diagnosis not present

## 2023-12-14 DIAGNOSIS — I951 Orthostatic hypotension: Secondary | ICD-10-CM | POA: Diagnosis not present

## 2023-12-14 DIAGNOSIS — I350 Nonrheumatic aortic (valve) stenosis: Secondary | ICD-10-CM | POA: Diagnosis not present

## 2023-12-14 DIAGNOSIS — I129 Hypertensive chronic kidney disease with stage 1 through stage 4 chronic kidney disease, or unspecified chronic kidney disease: Secondary | ICD-10-CM | POA: Diagnosis not present

## 2023-12-14 DIAGNOSIS — S42221D 2-part displaced fracture of surgical neck of right humerus, subsequent encounter for fracture with routine healing: Secondary | ICD-10-CM | POA: Diagnosis not present

## 2023-12-14 DIAGNOSIS — E1122 Type 2 diabetes mellitus with diabetic chronic kidney disease: Secondary | ICD-10-CM | POA: Diagnosis not present

## 2023-12-14 DIAGNOSIS — Z794 Long term (current) use of insulin: Secondary | ICD-10-CM | POA: Diagnosis not present

## 2023-12-15 DIAGNOSIS — S42221D 2-part displaced fracture of surgical neck of right humerus, subsequent encounter for fracture with routine healing: Secondary | ICD-10-CM | POA: Diagnosis not present

## 2023-12-15 DIAGNOSIS — R2681 Unsteadiness on feet: Secondary | ICD-10-CM | POA: Diagnosis not present

## 2023-12-15 DIAGNOSIS — Z9181 History of falling: Secondary | ICD-10-CM | POA: Diagnosis not present

## 2023-12-15 DIAGNOSIS — M6281 Muscle weakness (generalized): Secondary | ICD-10-CM | POA: Diagnosis not present

## 2023-12-17 NOTE — Progress Notes (Deleted)
 Cardiology Office Note   Date:  12/17/2023  ID:  Jennifer Ingram, DOB August 02, 1950, MRN [id number] PCP: System, Provider Not In  [facility] Providers Cardiologist:  Annabella Scarce, MD    History of Present Illness Jennifer Ingram is a 73 y.o. female with a past medical history of type 2 DM, aortic stenosis, HTN, HLD, CKD stage IIIb, hypothyroidism, depression, recent syncopal event. Patient is followed by Dr. Scarce and presents today for a hospital follow up appointment    Patient had presented to the ED on 7/13 after she had a presumed syncopal episode and fall. After the fall, she had significant pain in her right shoulder. Found to have a surgical neck fracture of the right humerus. In the ED, found to have glucose 540. Troponin negative. Treated with IV fluids. Echocardiogram 7/14 showed EF 60-65%, no regional wall motion abnormalities, mild LVH, mildly reduced RV systolic function, no evidence of mitral valve regurgitation or stenosis, mild AI, low-flow/low gradient at least moderate AS. Cardiology was consulted. Her BP was very labile during her admission and her atenolol  was held. Dr. Shirly reviewed echo, felt aortic stenosis was overall moderate-severe. Recommended avoiding hypotension, outpatient TAVR workup. If surgical risk was acceptable to anesthesia and the patient, it was felt to be OK to proceed with surgery.   I saw patient in clinic on 11/29/2023.  At that time, she denied palpitations, syncope, near syncope.  BP had overall been well-controlled, but she was not monitoring it very closely.  She was instructed to keep a BP log for close her blood pressure monitoring.  Syncope  Labile BP  - Patient recently admitted after a syncopal episode and fall on 7/13. Had labile BP during her admission. Also had poorly controlled diabetes and AKI. Echocardiogram showed moderate-severe AS  - Orthostatic vital signs were negative, but patient did become symptomatic upon standing  when in the hospital. Atenolol  held for labile BP   - BP log showed ***  - Encouraged patient to remain hydrated  - Cardiac monitor ***   Moderate-severe AS  - Noted on echocardiogram in 10/2023  - Pending outpatient TAVR eval - seeing Dr. Verlin in August  - Patient denies chest pain, shortness of breath, syncope, near syncope    HLD  - Lipid panel from 06/2023 showed LDL 100, HDL 33, triglycerides 196, total cholesterol 172  - continue crestor  20 mg daily    Type 2 DM  - A1c >15.5% - Followed by PD[facility]  - On insulin    ROS: ***  Studies Reviewed      *** Risk Assessment/Calculations {Does this patient have ATRIAL FIBRILLATION?:[phone]} No BP recorded.  {Refresh Note OR Click here to enter BP  :1}***       Physical Exam VS:  There were no vitals taken for this visit.       Wt Readings from Last 3 Encounters:  11/30/23 140 lb (63.5 kg)  11/29/23 140 lb (63.5 kg)  11/16/23 152 lb 8.9 oz (69.2 kg)    GEN: Well nourished, well developed in no acute distress NECK: No JVD; No carotid bruits CARDIAC: ***RRR, no murmurs, rubs, gallops RESPIRATORY:  Clear to auscultation without rales, wheezing or rhonchi  ABDOMEN: Soft, non-tender, non-distended EXTREMITIES:  No edema; No deformity   ASSESSMENT AND PLAN ***    {Are you ordering a CV Procedure (e.g. stress test, cath, DCCV, TEE, etc)?   Press F2        :[id number]}  Dispo: ***  Signed,  Rollo FABIENE Louder, PA-C

## 2023-12-19 ENCOUNTER — Telehealth: Payer: Self-pay | Admitting: *Deleted

## 2023-12-19 ENCOUNTER — Ambulatory Visit: Attending: Cardiology | Admitting: Cardiovascular Disease

## 2023-12-19 ENCOUNTER — Ambulatory Visit: Admitting: Cardiology

## 2023-12-19 NOTE — Telephone Encounter (Signed)
 Called to speak w patient about missed appointment this morning.  She was scheduled for TAVR consult.  Left message for her to call back.  Placed slot on hold for 11:20 am 12/20/23 for her.

## 2023-12-19 NOTE — Progress Notes (Deleted)
 [facility] Consult Note  No chief complaint on file.  History of Present Illness: 73 yo female with history of DM, HTN, HLD, hypothyroidism, CKD stage 3b and aortic stenosis who is here today as a new consult, referred by Dr. Raford, for further discussion regarding her aortic stenosis and possible TAVR. She was admitted to [facility] in July 2025 following a syncopal event with fracture of her right humerus. Glucose over 500. Echo 11/12/23 with LVEF=60-65%. Moderate paradoxical low flow/low gradient aortic stenosis with mean gradient 11 mmHg, AVA 1.0 cm2, DI 0.42.   She tells me today that she *** She lives in [place], [place] with ***.  She is retired Engineer, manufacturing ***  Primary Care Physician: System, Provider Not In Primary Cardiologist: Raford Referring Cardiologist: Raford  Past Medical History:  Diagnosis Date   Arthritis    bilateral knees and back;   Depression    on meds, working well   Diabetes mellitus without complication (HCC) 05/31/2012   on meds   Heart palpitations    rapid - hx, no current problem per pt on 11/28/23   Hyperlipidemia    on meds   Hypertension    on meds   Hypothyroidism     Past Surgical History:  Procedure Laterality Date   APPENDECTOMY  1968   CESAREAN SECTION  1980   CHOLECYSTECTOMY  1985   ORIF HUMERUS FRACTURE Right 11/30/2023   Procedure: OPEN REDUCTION INTERNAL FIXATION (ORIF) PROXIMAL HUMERUS FRACTURE, RIGHT;  Surgeon: Sharl Selinda Dover, MD;  Location: [facility] OR;  Service: Orthopedics;  Laterality: Right;   TONSILLECTOMY  1960    Current Outpatient Medications  Medication Sig Dispense Refill   Accu-Chek Softclix Lancets lancets Check blood sugar 3 times a day 100 each 11   Continuous Blood Gluc Receiver (FREESTYLE LIBRE 3 READER) DEVI USE AS DIRECTED 1 each 0   Continuous Glucose Sensor (FREESTYLE LIBRE 3 SENSOR) MISC PLACE SENSOR ON THE SKIN EVERY 14 DAYS TO CHECK GLUXOSE CONTINUOUSLY 6 each 0   EUTHYROX  50 MCG tablet  Take 1 tablet by mouth once daily 30 tablet 1   gabapentin  (NEURONTIN ) 300 MG capsule Take 300 mg by mouth at bedtime.     glucose blood (ACCU-CHEK GUIDE) test strip Check blood sugar 3 times per day 100 each 12   HYDROcodone -acetaminophen  (NORCO/VICODIN) 5-325 MG tablet Take 1 tablet by mouth every 6 (six) hours as needed for moderate pain (pain score 4-6). 10 tablet 0   insulin  isophane & regular human KwikPen (HUMULIN  70/30 KWIKPEN) (70-30) 100 UNIT/ML KwikPen Inject 10 Units into the skin 2 (two) times daily. 15 mL 11   Insulin  Pen Needle (PEN NEEDLES) 31G X 5 MM MISC 1 each by Does not apply route daily. 90 each 3   ondansetron  (ZOFRAN -ODT) 4 MG disintegrating tablet Take 1 tablet (4 mg total) by mouth every 8 (eight) hours as needed for nausea or vomiting. 20 tablet 0   oxyCODONE  (ROXICODONE ) 5 MG immediate release tablet Take 1 tablet (5 mg total) by mouth every 4 (four) hours as needed for moderate pain (pain score 4-6). 20 tablet 0   PARoxetine  (PAXIL ) 40 MG tablet TAKE 1 TABLET BY MOUTH ONCE DAILY AFTER BREAKFAST 90 tablet 2   rosuvastatin  (CRESTOR ) 20 MG tablet Take 1 tablet (20 mg total) by mouth daily. 90 tablet 4   No current facility-administered medications for this visit.    No Known Allergies  Social History   Socioeconomic History   Marital status: Widowed  Spouse name: Not on file   Number of children: 1   Years of education: 18   Highest education level: 12th grade  Occupational History   Not on file  Tobacco Use   Smoking status: Never   Smokeless tobacco: Never  Vaping Use   Vaping status: Never Used  Substance and Sexual Activity   Alcohol use: No   Drug use: No   Sexual activity: Not Currently    Birth control/protection: Post-menopausal  Other Topics Concern   Not on file  Social History Narrative   Not on file   Social Drivers of Health   Financial Resource Strain: Not on file  Food Insecurity: No Food Insecurity (12/01/2023)   Hunger Vital Sign     Worried About Running Out of Food in the Last Year: Never true    Ran Out of Food in the Last Year: Never true  Transportation Needs: No Transportation Needs (12/01/2023)   PRAPARE - Administrator, Civil Service (Medical): No    Lack of Transportation (Non-Medical): No  Physical Activity: Sufficiently Active (10/10/2022)   Exercise Vital Sign    Days of Exercise per Week: 3 days    Minutes of Exercise per Session: 60 min  Stress: No Stress Concern Present (10/10/2022)   [employer] of Occupational Health - Occupational Stress Questionnaire    Feeling of Stress : Not at all  Social Connections: Moderately Integrated (12/01/2023)   Social Connection and Isolation Panel    Frequency of Communication with Friends and Family: More than three times a week    Frequency of Social Gatherings with Friends and Family: Not on file    Attends Religious Services: 1 to 4 times per year    Active Member of [employer] or Organizations: No    Attends Banker Meetings: 1 to 4 times per year    Marital Status: Widowed  Intimate Partner Violence: Not At Risk (12/01/2023)   Humiliation, Afraid, Rape, and Kick questionnaire    Fear of Current or Ex-Partner: No    Emotionally Abused: No    Physically Abused: No    Sexually Abused: No    Family History  Problem Relation Age of Onset   Cancer Mother        leukemia   Cancer Father        lung   Breast cancer Neg Hx    Colon polyps Neg Hx    Crohn's disease Neg Hx    Esophageal cancer Neg Hx    Stomach cancer Neg Hx    Rectal cancer Neg Hx     Review of Systems:  As stated in the HPI and otherwise negative.   There were no vitals taken for this visit.  Physical Examination: General: Well developed, well nourished, NAD  HEENT: OP clear, mucus membranes moist  SKIN: warm, dry. No rashes. Neuro: No focal deficits  Musculoskeletal: Muscle strength 5/5 all ext  Psychiatric: Mood and affect normal  Neck: No JVD, no carotid  bruits, no thyromegaly, no lymphadenopathy.  Lungs:Clear bilaterally, no wheezes, rhonci, crackles Cardiovascular: Regular rate and rhythm. *** Loud, harsh, late peaking systolic murmur.  Abdomen:Soft. Bowel sounds present. Non-tender.  Extremities: *** No lower extremity edema. Pulses are 2 + in the bilateral DP/PT.  EKG:  EKG {ACTION; IS/IS [id number]:[id number]} ordered today. The ekg ordered today demonstrates ***  Echo 11/12/23: 1. Left ventricular ejection fraction, by estimation, is 60 to 65%. The  left ventricle has normal function. The left  ventricle has no regional  wall motion abnormalities. There is mild left ventricular hypertrophy.  Left ventricular diastolic parameters  were normal.   2. Right ventricular systolic function is mildly reduced. The right  ventricular size is normal. Mildly increased right ventricular wall  thickness. Tricuspid regurgitation signal is inadequate for assessing PA  pressure.   3. The mitral valve is degenerative. No evidence of mitral valve  regurgitation. No evidence of mitral stenosis.   4. The aortic valve is tricuspid. Aortic valve regurgitation is mild.  Paradoxical low flow, low gradient atleast moderate AS (peak velocity  2.13m/s, MG , AVA (VTI) 1.07cm2, planimetry 0.89cm2, DI 0.42, SVi  26).   5. The inferior vena cava is normal in size with <50% respiratory  variability, suggesting right atrial pressure of 8 mmHg.   Comparison(s): No prior Echocardiogram.   Conclusion(s)/Recommendation(s): No left ventricular mural or apical  thrombus/thrombi. Either consider aortic valve calcium  score or invasive  hemodynamics to further clarify the severity of aortic stenosis.   FINDINGS   Left Ventricle: Left ventricular ejection fraction, by estimation, is 60  to 65%. The left ventricle has normal function. The left ventricle has no  regional wall motion abnormalities. The left ventricular internal cavity  size was small. There is mild  left  ventricular hypertrophy. Left ventricular diastolic parameters were  normal.   Right Ventricle: The right ventricular size is normal. Mildly increased  right ventricular wall thickness. Right ventricular systolic function is  mildly reduced. Tricuspid regurgitation signal is inadequate for assessing  PA pressure.   Left Atrium: Left atrial size was normal in size.   Right Atrium: Right atrial size was normal in size.   Pericardium: There is no evidence of pericardial effusion.   Mitral Valve: The mitral valve is degenerative in appearance. There is  mild thickening of the mitral valve leaflet(s). Normal mobility of the  mitral valve leaflets. Mild mitral annular calcification. No evidence of  mitral valve regurgitation. No evidence   of mitral valve stenosis.   Tricuspid Valve: The tricuspid valve is grossly normal. Tricuspid valve  regurgitation is not demonstrated. No evidence of tricuspid stenosis.   Aortic Valve: The aortic valve is tricuspid. Aortic valve regurgitation is  mild. Paradoxical low flow, low gradient atleast moderate AS (peak  velocity 2.41m/s, MG , AVA (VTI) 1.07cm2, planimetry 0.89cm2, DI  0.42, SVi 26). Aortic valve mean gradient  measures 10.5 mmHg. Aortic valve peak gradient measures 19.4 mmHg. Aortic  valve area, by VTI measures 1.07 cm.   Pulmonic Valve: The pulmonic valve was normal in structure. Pulmonic valve  regurgitation is mild. No evidence of pulmonic stenosis.   Aorta: The aortic root and ascending aorta are structurally normal, with  no evidence of dilitation.   Venous: The inferior vena cava is normal in size with less than 50%  respiratory variability, suggesting right atrial pressure of 8 mmHg.   IAS/Shunts: The atrial septum is grossly normal.     LEFT VENTRICLE  PLAX 2D  LVIDd:         3.20 cm     Diastology  LVIDs:         2.00 cm     LV e' medial:    6.31 cm/s  LV PW:         1.20 cm     LV E/e' medial:  10.2  LV IVS:         1.10 cm     LV e' lateral:   5.22 cm/s  LVOT diam:     1.80 cm     LV E/e' lateral: 12.3  LV SV:         42  LV SV Index:   26  LVOT Area:     2.54 cm    LV Volumes (MOD)  LV vol d, MOD A2C: 51.4 ml  LV vol d, MOD A4C: 51.3 ml  LV vol s, MOD A2C: 18.8 ml  LV vol s, MOD A4C: 19.3 ml  LV SV MOD A2C:     32.6 ml  LV SV MOD A4C:     51.3 ml  LV SV MOD BP:      33.0 ml   RIGHT VENTRICLE            IVC  RV S prime:     8.49 cm/s  IVC diam: 1.90 cm  TAPSE (M-mode): 1.6 cm   LEFT ATRIUM             Index  LA diam:        2.90 cm 1.77 cm/m  LA Vol (A2C):   34.6 ml 21.12 ml/m  LA Vol (A4C):   38.3 ml 23.38 ml/m  LA Biplane Vol: 38.7 ml 23.63 ml/m   AORTIC VALVE  AV Area (Vmax):    1.01 cm  AV Area (Vmean):   0.99 cm  AV Area (VTI):     1.07 cm  AV Vmax:           220.00 cm/s  AV Vmean:          152.500 cm/s  AV VTI:            0.398 m  AV Peak Grad:      19.4 mmHg  AV Mean Grad:      10.5 mmHg  LVOT Vmax:         87.00 cm/s  LVOT Vmean:        59.600 cm/s  LVOT VTI:          0.167 m  LVOT/AV VTI ratio: 0.42    AORTA  Ao Root diam: 3.20 cm  Ao Asc diam:  3.20 cm   MITRAL VALVE  MV Area (PHT): 3.07 cm    SHUNTS  MV Decel Time: 247 msec    Systemic VTI:  0.17 m  MV E velocity: 64.30 cm/s  Systemic Diam: 1.80 cm  MV A velocity: 84.00 cm/s  MV E/A ratio:  0.77   Recent Labs: 11/11/2023: ALT 10; TSH 2.076 11/14/2023: Magnesium 1.7 11/30/2023: Platelets 304 12/01/2023: BUN 22; Creatinine, Ser 1.16; Hemoglobin 11.4; Potassium 3.6; Sodium 137   Lipid Panel    Component Value Date/Time   CHOL 172 07/21/2023 0259   CHOL 201 (H) 12/17/2020 1103   TRIG 196 (H) 07/21/2023 0259   HDL 33 (L) 07/21/2023 0259   HDL 28 (L) 12/17/2020 1103   CHOLHDL 5.2 07/21/2023 0259   VLDL 39 07/21/2023 0259   LDLCALC 100 (H) 07/21/2023 0259   LDLCALC 124 (H) 12/17/2020 1103     Wt Readings from Last 3 Encounters:  11/30/23 140 lb (63.5 kg)  11/29/23 140 lb (63.5 kg)  11/16/23 152 lb  8.9 oz (69.2 kg)     Assessment and Plan:   1. Moderate Aortic Valve Stenosis: She has moderate aortic stenosis. NYHA class 2. I have personally reviewed the echo images. The aortic valve is thickened and calcified but the leaflets opoen reasonably well.    I have reviewed the natural history  of aortic stenosis with the patient and their family members  who are present today. We have discussed the limitations of medical therapy and the poor prognosis associated with symptomatic aortic stenosis. We have reviewed potential treatment options, including palliative medical therapy, conventional surgical aortic valve replacement, and transcatheter aortic valve replacement. We discussed treatment options in the context of the patient's specific comorbid medical conditions.   Will plan to repeat echo in one year. She will call in the meantime with any change in her clinical status.      Labs/ tests ordered today include:  No orders of the defined types were placed in this encounter.  Disposition:   F/U with me in one year  Signed, Lonni Cash, MD, [facility] 12/19/2023 7:22 AM    [facility] [address] [place], [place], [place]  [address] Phone: [phone]; Fax: [phone]

## 2023-12-20 ENCOUNTER — Institutional Professional Consult (permissible substitution): Admitting: Cardiovascular Disease

## 2023-12-20 ENCOUNTER — Inpatient Hospital Stay: Payer: Self-pay

## 2023-12-20 NOTE — Telephone Encounter (Signed)
 Left message to call back.

## 2023-12-20 NOTE — Telephone Encounter (Signed)
 Left message to call back to reschedule appointment with Dr. Verlin to discuss aortic stenosis.

## 2023-12-20 NOTE — Telephone Encounter (Signed)
 Patient is returning phone call.

## 2023-12-20 NOTE — Telephone Encounter (Signed)
 Pt returned my call.  She is doing okay for now.  She recently fractured her shoulder but states she is getting around okay.  Scheduled her consult for next Thursday w Dr. Verlin.

## 2023-12-24 ENCOUNTER — Ambulatory Visit: Attending: Cardiology | Admitting: Cardiology

## 2023-12-27 ENCOUNTER — Ambulatory Visit: Attending: Cardiovascular Disease | Admitting: Cardiovascular Disease

## 2023-12-27 NOTE — Progress Notes (Deleted)
 [facility] Consult Note  No chief complaint on file.  History of Present Illness: 73 yo female with history of DM, HTN, HLD, hypothyroidism, CKD stage 3b and aortic stenosis who is here today as a new consult, referred by Dr. Raford, for further discussion regarding her aortic stenosis and possible TAVR. She was admitted to [facility] in July 2025 following a syncopal event with fracture of her right humerus. Glucose over 500. Echo 11/12/23 with LVEF=60-65%. Moderate paradoxical low flow/low gradient aortic stenosis with mean gradient 11 mmHg, AVA 1.0 cm2, DI 0.42.   She tells me today that she *** She lives in [place], [place] with ***.  She is retired Engineer, manufacturing ***  Primary Care Physician: System, Provider Not In Primary Cardiologist: Raford Referring Cardiologist: Raford  Past Medical History:  Diagnosis Date   Arthritis    bilateral knees and back;   Depression    on meds, working well   Diabetes mellitus without complication (HCC) 05/31/2012   on meds   Heart palpitations    rapid - hx, no current problem per pt on 11/28/23   Hyperlipidemia    on meds   Hypertension    on meds   Hypothyroidism     Past Surgical History:  Procedure Laterality Date   APPENDECTOMY  1968   CESAREAN SECTION  1980   CHOLECYSTECTOMY  1985   ORIF HUMERUS FRACTURE Right 11/30/2023   Procedure: OPEN REDUCTION INTERNAL FIXATION (ORIF) PROXIMAL HUMERUS FRACTURE, RIGHT;  Surgeon: Sharl Selinda Dover, MD;  Location: [facility] OR;  Service: Orthopedics;  Laterality: Right;   TONSILLECTOMY  1960    Current Outpatient Medications  Medication Sig Dispense Refill   Accu-Chek Softclix Lancets lancets Check blood sugar 3 times a day 100 each 11   Continuous Blood Gluc Receiver (FREESTYLE LIBRE 3 READER) DEVI USE AS DIRECTED 1 each 0   Continuous Glucose Sensor (FREESTYLE LIBRE 3 SENSOR) MISC PLACE SENSOR ON THE SKIN EVERY 14 DAYS TO CHECK GLUXOSE CONTINUOUSLY 6 each 0   EUTHYROX  50 MCG tablet  Take 1 tablet by mouth once daily 30 tablet 1   gabapentin  (NEURONTIN ) 300 MG capsule Take 300 mg by mouth at bedtime.     glucose blood (ACCU-CHEK GUIDE) test strip Check blood sugar 3 times per day 100 each 12   HYDROcodone -acetaminophen  (NORCO/VICODIN) 5-325 MG tablet Take 1 tablet by mouth every 6 (six) hours as needed for moderate pain (pain score 4-6). 10 tablet 0   insulin  isophane & regular human KwikPen (HUMULIN  70/30 KWIKPEN) (70-30) 100 UNIT/ML KwikPen Inject 10 Units into the skin 2 (two) times daily. 15 mL 11   Insulin  Pen Needle (PEN NEEDLES) 31G X 5 MM MISC 1 each by Does not apply route daily. 90 each 3   ondansetron  (ZOFRAN -ODT) 4 MG disintegrating tablet Take 1 tablet (4 mg total) by mouth every 8 (eight) hours as needed for nausea or vomiting. 20 tablet 0   oxyCODONE  (ROXICODONE ) 5 MG immediate release tablet Take 1 tablet (5 mg total) by mouth every 4 (four) hours as needed for moderate pain (pain score 4-6). 20 tablet 0   PARoxetine  (PAXIL ) 40 MG tablet TAKE 1 TABLET BY MOUTH ONCE DAILY AFTER BREAKFAST 90 tablet 2   rosuvastatin  (CRESTOR ) 20 MG tablet Take 1 tablet (20 mg total) by mouth daily. 90 tablet 4   No current facility-administered medications for this visit.    No Known Allergies  Social History   Socioeconomic History   Marital status: Widowed  Spouse name: Not on file   Number of children: 1   Years of education: 3   Highest education level: 12th grade  Occupational History   Not on file  Tobacco Use   Smoking status: Never   Smokeless tobacco: Never  Vaping Use   Vaping status: Never Used  Substance and Sexual Activity   Alcohol use: No   Drug use: No   Sexual activity: Not Currently    Birth control/protection: Post-menopausal  Other Topics Concern   Not on file  Social History Narrative   Not on file   Social Drivers of Health   Financial Resource Strain: Not on file  Food Insecurity: No Food Insecurity (12/01/2023)   Hunger Vital Sign     Worried About Running Out of Food in the Last Year: Never true    Ran Out of Food in the Last Year: Never true  Transportation Needs: No Transportation Needs (12/01/2023)   PRAPARE - Administrator, Civil Service (Medical): No    Lack of Transportation (Non-Medical): No  Physical Activity: Sufficiently Active (10/10/2022)   Exercise Vital Sign    Days of Exercise per Week: 3 days    Minutes of Exercise per Session: 60 min  Stress: No Stress Concern Present (10/10/2022)   [employer] of Occupational Health - Occupational Stress Questionnaire    Feeling of Stress : Not at all  Social Connections: Moderately Integrated (12/01/2023)   Social Connection and Isolation Panel    Frequency of Communication with Friends and Family: More than three times a week    Frequency of Social Gatherings with Friends and Family: Not on file    Attends Religious Services: 1 to 4 times per year    Active Member of [employer] or Organizations: No    Attends Banker Meetings: 1 to 4 times per year    Marital Status: Widowed  Intimate Partner Violence: Not At Risk (12/01/2023)   Humiliation, Afraid, Rape, and Kick questionnaire    Fear of Current or Ex-Partner: No    Emotionally Abused: No    Physically Abused: No    Sexually Abused: No    Family History  Problem Relation Age of Onset   Cancer Mother        leukemia   Cancer Father        lung   Breast cancer Neg Hx    Colon polyps Neg Hx    Crohn's disease Neg Hx    Esophageal cancer Neg Hx    Stomach cancer Neg Hx    Rectal cancer Neg Hx     Review of Systems:  As stated in the HPI and otherwise negative.   There were no vitals taken for this visit.  Physical Examination: General: Well developed, well nourished, NAD  HEENT: OP clear, mucus membranes moist  SKIN: warm, dry. No rashes. Neuro: No focal deficits  Musculoskeletal: Muscle strength 5/5 all ext  Psychiatric: Mood and affect normal  Neck: No JVD, no carotid  bruits, no thyromegaly, no lymphadenopathy.  Lungs:Clear bilaterally, no wheezes, rhonci, crackles Cardiovascular: Regular rate and rhythm. *** Loud, harsh, late peaking systolic murmur.  Abdomen:Soft. Bowel sounds present. Non-tender.  Extremities: *** No lower extremity edema. Pulses are 2 + in the bilateral DP/PT.  EKG:  EKG {ACTION; IS/IS WNU:[id number]} ordered today. The ekg ordered today demonstrates ***  Echo 11/12/23: 1. Left ventricular ejection fraction, by estimation, is 60 to 65%. The  left ventricle has normal function. The left  ventricle has no regional  wall motion abnormalities. There is mild left ventricular hypertrophy.  Left ventricular diastolic parameters  were normal.   2. Right ventricular systolic function is mildly reduced. The right  ventricular size is normal. Mildly increased right ventricular wall  thickness. Tricuspid regurgitation signal is inadequate for assessing PA  pressure.   3. The mitral valve is degenerative. No evidence of mitral valve  regurgitation. No evidence of mitral stenosis.   4. The aortic valve is tricuspid. Aortic valve regurgitation is mild.  Paradoxical low flow, low gradient atleast moderate AS (peak velocity  2.38m/s, MG , AVA (VTI) 1.07cm2, planimetry 0.89cm2, DI 0.42, SVi  26).   5. The inferior vena cava is normal in size with <50% respiratory  variability, suggesting right atrial pressure of 8 mmHg.   Comparison(s): No prior Echocardiogram.   Conclusion(s)/Recommendation(s): No left ventricular mural or apical  thrombus/thrombi. Either consider aortic valve calcium  score or invasive  hemodynamics to further clarify the severity of aortic stenosis.   FINDINGS   Left Ventricle: Left ventricular ejection fraction, by estimation, is 60  to 65%. The left ventricle has normal function. The left ventricle has no  regional wall motion abnormalities. The left ventricular internal cavity  size was small. There is mild  left  ventricular hypertrophy. Left ventricular diastolic parameters were  normal.   Right Ventricle: The right ventricular size is normal. Mildly increased  right ventricular wall thickness. Right ventricular systolic function is  mildly reduced. Tricuspid regurgitation signal is inadequate for assessing  PA pressure.   Left Atrium: Left atrial size was normal in size.   Right Atrium: Right atrial size was normal in size.   Pericardium: There is no evidence of pericardial effusion.   Mitral Valve: The mitral valve is degenerative in appearance. There is  mild thickening of the mitral valve leaflet(s). Normal mobility of the  mitral valve leaflets. Mild mitral annular calcification. No evidence of  mitral valve regurgitation. No evidence   of mitral valve stenosis.   Tricuspid Valve: The tricuspid valve is grossly normal. Tricuspid valve  regurgitation is not demonstrated. No evidence of tricuspid stenosis.   Aortic Valve: The aortic valve is tricuspid. Aortic valve regurgitation is  mild. Paradoxical low flow, low gradient atleast moderate AS (peak  velocity 2.24m/s, MG , AVA (VTI) 1.07cm2, planimetry 0.89cm2, DI  0.42, SVi 26). Aortic valve mean gradient  measures 10.5 mmHg. Aortic valve peak gradient measures 19.4 mmHg. Aortic  valve area, by VTI measures 1.07 cm.   Pulmonic Valve: The pulmonic valve was normal in structure. Pulmonic valve  regurgitation is mild. No evidence of pulmonic stenosis.   Aorta: The aortic root and ascending aorta are structurally normal, with  no evidence of dilitation.   Venous: The inferior vena cava is normal in size with less than 50%  respiratory variability, suggesting right atrial pressure of 8 mmHg.   IAS/Shunts: The atrial septum is grossly normal.     LEFT VENTRICLE  PLAX 2D  LVIDd:         3.20 cm     Diastology  LVIDs:         2.00 cm     LV e' medial:    6.31 cm/s  LV PW:         1.20 cm     LV E/e' medial:  10.2  LV IVS:         1.10 cm     LV e' lateral:   5.22 cm/s  LVOT diam:     1.80 cm     LV E/e' lateral: 12.3  LV SV:         42  LV SV Index:   26  LVOT Area:     2.54 cm    LV Volumes (MOD)  LV vol d, MOD A2C: 51.4 ml  LV vol d, MOD A4C: 51.3 ml  LV vol s, MOD A2C: 18.8 ml  LV vol s, MOD A4C: 19.3 ml  LV SV MOD A2C:     32.6 ml  LV SV MOD A4C:     51.3 ml  LV SV MOD BP:      33.0 ml   RIGHT VENTRICLE            IVC  RV S prime:     8.49 cm/s  IVC diam: 1.90 cm  TAPSE (M-mode): 1.6 cm   LEFT ATRIUM             Index  LA diam:        2.90 cm 1.77 cm/m  LA Vol (A2C):   34.6 ml 21.12 ml/m  LA Vol (A4C):   38.3 ml 23.38 ml/m  LA Biplane Vol: 38.7 ml 23.63 ml/m   AORTIC VALVE  AV Area (Vmax):    1.01 cm  AV Area (Vmean):   0.99 cm  AV Area (VTI):     1.07 cm  AV Vmax:           220.00 cm/s  AV Vmean:          152.500 cm/s  AV VTI:            0.398 m  AV Peak Grad:      19.4 mmHg  AV Mean Grad:      10.5 mmHg  LVOT Vmax:         87.00 cm/s  LVOT Vmean:        59.600 cm/s  LVOT VTI:          0.167 m  LVOT/AV VTI ratio: 0.42    AORTA  Ao Root diam: 3.20 cm  Ao Asc diam:  3.20 cm   MITRAL VALVE  MV Area (PHT): 3.07 cm    SHUNTS  MV Decel Time: 247 msec    Systemic VTI:  0.17 m  MV E velocity: 64.30 cm/s  Systemic Diam: 1.80 cm  MV A velocity: 84.00 cm/s  MV E/A ratio:  0.77   Recent Labs: 11/11/2023: ALT 10; TSH 2.076 11/14/2023: Magnesium 1.7 11/30/2023: Platelets 304 12/01/2023: BUN 22; Creatinine, Ser 1.16; Hemoglobin 11.4; Potassium 3.6; Sodium 137   Lipid Panel    Component Value Date/Time   CHOL 172 07/21/2023 0259   CHOL 201 (H) 12/17/2020 1103   TRIG 196 (H) 07/21/2023 0259   HDL 33 (L) 07/21/2023 0259   HDL 28 (L) 12/17/2020 1103   CHOLHDL 5.2 07/21/2023 0259   VLDL 39 07/21/2023 0259   LDLCALC 100 (H) 07/21/2023 0259   LDLCALC 124 (H) 12/17/2020 1103     Wt Readings from Last 3 Encounters:  11/30/23 140 lb (63.5 kg)  11/29/23 140 lb (63.5 kg)  11/16/23 152 lb  8.9 oz (69.2 kg)     Assessment and Plan:   1. Moderate Aortic Valve Stenosis: She has moderate aortic stenosis. NYHA class 2. I have personally reviewed the echo images. The aortic valve is thickened and calcified but the leaflets opoen reasonably well.  Her AS is visually moderate.   I  have reviewed the natural history of aortic stenosis with the patient and their family members  who are present today. We have discussed the limitations of medical therapy and the poor prognosis associated with symptomatic aortic stenosis. We have reviewed potential treatment options, including palliative medical therapy, conventional surgical aortic valve replacement, and transcatheter aortic valve replacement. We discussed treatment options in the context of the patient's specific comorbid medical conditions.   Will plan to repeat echo in one year. She will call in the meantime with any change in her clinical status.      Labs/ tests ordered today include:  No orders of the defined types were placed in this encounter.  Disposition:   F/U with me in one year  Signed, Lonni Cash, MD, [facility] 12/27/2023 6:27 AM    [facility] [address], [place], [place]  [address] Phone: [phone]; Fax: [phone]

## 2023-12-28 ENCOUNTER — Telehealth: Payer: Self-pay

## 2023-12-28 NOTE — Telephone Encounter (Signed)
 I left a voice message for the pt to call me back to reschedule her appointment with Dr. Verlin.

## 2024-01-02 NOTE — Telephone Encounter (Signed)
 Left message for pt to call back to schedule appt

## 2024-01-09 NOTE — Telephone Encounter (Signed)
 3rd attempt to call pt to reschedule appointment with Dr. Verlin. A letter will be sent to her home address asking her to call us  and reschedule her appointment.

## 2024-01-14 NOTE — Progress Notes (Unsigned)
 Gynecologic Oncology Return Clinic Visit  01/14/24  Reason for Visit: follow-up  Treatment History: The patient was admitted in March 2025 in DKA after running out of insulin  in the setting of her type 2 diabetes.  She was noted to have what appeared to be a growth on her right labia.  CT imaging on 3/22 showed no masses of the visualized perineum or mons pubis, no metastatic disease including no adenopathy.  Right vulvar lesion was biopsied on 3/21 with pathology showing soft tissue with dense acute and chronic inflammation.  Given concern for possible infection (WBC initially 16.3), she was treated with IV antibiotics and ultimately transition to home to finish a course of Augmentin .  Interval History: Doing very well.  Notes some fatigue.  Has maintained her CBGs under 200.  Denies any vulvar pain, pruritus, discharge, or bleeding.  Endorses normal bowel and bladder function.  Past Medical/Surgical History: Past Medical History:  Diagnosis Date   Arthritis    bilateral knees and back;   Depression    on meds, working well   Diabetes mellitus without complication (HCC) 05/31/2012   on meds   Heart palpitations    rapid - hx, no current problem per pt on 11/28/23   Hyperlipidemia    on meds   Hypertension    on meds   Hypothyroidism     Past Surgical History:  Procedure Laterality Date   APPENDECTOMY  1968   CESAREAN SECTION  1980   CHOLECYSTECTOMY  1985   ORIF HUMERUS FRACTURE Right 11/30/2023   Procedure: OPEN REDUCTION INTERNAL FIXATION (ORIF) PROXIMAL HUMERUS FRACTURE, RIGHT;  Surgeon: Sharl Selinda Dover, MD;  Location: [facility] OR;  Service: Orthopedics;  Laterality: Right;   TONSILLECTOMY  1960    Family History  Problem Relation Age of Onset   Cancer Mother        leukemia   Cancer Father        lung   Breast cancer Neg Hx    Colon polyps Neg Hx    Crohn's disease Neg Hx    Esophageal cancer Neg Hx    Stomach cancer Neg Hx    Rectal cancer Neg Hx     Social  History   Socioeconomic History   Marital status: Widowed    Spouse name: Not on file   Number of children: 1   Years of education: 23   Highest education level: 12th grade  Occupational History   Not on file  Tobacco Use   Smoking status: Never   Smokeless tobacco: Never  Vaping Use   Vaping status: Never Used  Substance and Sexual Activity   Alcohol use: No   Drug use: No   Sexual activity: Not Currently    Birth control/protection: Post-menopausal  Other Topics Concern   Not on file  Social History Narrative   Not on file   Social Drivers of Health   Financial Resource Strain: Not on file  Food Insecurity: No Food Insecurity (12/01/2023)   Hunger Vital Sign    Worried About Running Out of Food in the Last Year: Never true    Ran Out of Food in the Last Year: Never true  Transportation Needs: No Transportation Needs (12/01/2023)   PRAPARE - Administrator, Civil Service (Medical): No    Lack of Transportation (Non-Medical): No  Physical Activity: Sufficiently Active (10/10/2022)   Exercise Vital Sign    Days of Exercise per Week: 3 days    Minutes of Exercise  per Session: 60 min  Stress: No Stress Concern Present (10/10/2022)   [employer] of Occupational Health - Occupational Stress Questionnaire    Feeling of Stress : Not at all  Social Connections: Moderately Integrated (12/01/2023)   Social Connection and Isolation Panel    Frequency of Communication with Friends and Family: More than three times a week    Frequency of Social Gatherings with Friends and Family: Not on file    Attends Religious Services: 1 to 4 times per year    Active Member of [employer] or Organizations: No    Attends Banker Meetings: 1 to 4 times per year    Marital Status: Widowed    Current Medications:  Current Outpatient Medications:    Accu-Chek Softclix Lancets lancets, Check blood sugar 3 times a day, Disp: 100 each, Rfl: 11   Continuous Blood Gluc Receiver  (FREESTYLE LIBRE 3 READER) DEVI, USE AS DIRECTED, Disp: 1 each, Rfl: 0   Continuous Glucose Sensor (FREESTYLE LIBRE 3 SENSOR) MISC, PLACE SENSOR ON THE SKIN EVERY 14 DAYS TO CHECK GLUXOSE CONTINUOUSLY, Disp: 6 each, Rfl: 0   EUTHYROX  50 MCG tablet, Take 1 tablet by mouth once daily, Disp: 30 tablet, Rfl: 1   gabapentin  (NEURONTIN ) 300 MG capsule, Take 300 mg by mouth at bedtime., Disp: , Rfl:    glucose blood (ACCU-CHEK GUIDE) test strip, Check blood sugar 3 times per day, Disp: 100 each, Rfl: 12   HYDROcodone -acetaminophen  (NORCO/VICODIN) 5-325 MG tablet, Take 1 tablet by mouth every 6 (six) hours as needed for moderate pain (pain score 4-6)., Disp: 10 tablet, Rfl: 0   insulin  isophane & regular human KwikPen (HUMULIN  70/30 KWIKPEN) (70-30) 100 UNIT/ML KwikPen, Inject 10 Units into the skin 2 (two) times daily., Disp: 15 mL, Rfl: 11   Insulin  Pen Needle (PEN NEEDLES) 31G X 5 MM MISC, 1 each by Does not apply route daily., Disp: 90 each, Rfl: 3   ondansetron  (ZOFRAN -ODT) 4 MG disintegrating tablet, Take 1 tablet (4 mg total) by mouth every 8 (eight) hours as needed for nausea or vomiting., Disp: 20 tablet, Rfl: 0   oxyCODONE  (ROXICODONE ) 5 MG immediate release tablet, Take 1 tablet (5 mg total) by mouth every 4 (four) hours as needed for moderate pain (pain score 4-6)., Disp: 20 tablet, Rfl: 0   PARoxetine  (PAXIL ) 40 MG tablet, TAKE 1 TABLET BY MOUTH ONCE DAILY AFTER BREAKFAST, Disp: 90 tablet, Rfl: 2   rosuvastatin  (CRESTOR ) 20 MG tablet, Take 1 tablet (20 mg total) by mouth daily., Disp: 90 tablet, Rfl: 4  Review of Systems: Denies appetite changes, fevers, chills, unexplained weight changes. Denies hearing loss, neck lumps or masses, mouth sores, ringing in ears or voice changes. Denies cough or wheezing.  Denies shortness of breath. Denies chest pain or palpitations. Denies leg swelling. Denies abdominal distention, pain, blood in stools, constipation, diarrhea, nausea, vomiting, or early  satiety. Denies pain with intercourse, dysuria, frequency, hematuria or incontinence. Denies hot flashes, pelvic pain, vaginal bleeding or vaginal discharge.   Denies joint pain, back pain or muscle pain/cramps. Denies itching, rash, or wounds. Denies dizziness, headaches, numbness or seizures. Denies swollen lymph nodes or glands, denies easy bruising or bleeding. Denies anxiety, depression, confusion, or decreased concentration.  Physical Exam: There were no vitals taken for this visit. General: Alert, oriented, no acute distress. HEENT: Posterior oropharynx clear, sclera anicteric. Chest: Clear to auscultation bilaterally.  No wheezes or rhonchi. Cardiovascular: Regular rate and rhythm, no murmurs. Abdomen: soft, nontender.  Normoactive bowel sounds.  No masses or hepatosplenomegaly appreciated.   Extremities: Grossly normal range of motion.  Warm, well perfused.  No edema bilaterally. GU: Hyperpigmented area along most of the length of her right vulva, suspected scar from recently treated infectious process.  No areas of fluctuance or induration.    Laboratory & Radiologic Studies: 07/20/23: A. VULVAR LESION, RIGHT, EXCISION:  - Soft tissue with dense acute and chronic inflammation.  See comment.   COMMENT:   The clinical concern for malignancy is noted.  The biopsy reveals soft  tissue with dense acute and chronic inflammation.  No squamous  epithelium is identified.  This case was reviewed with Dr. Macie who agrees  with the above interpretation.   Assessment & Plan: MARIJAYNE RAUTH is a 73 y.o. woman with recent admission for DKA found to have a large right vulvar infection and open wound concerning for possible underlying malignancy.  Biopsy at that time showed acute and chronic inflammation.  She was treated with course of antibiotics.  Today, patient is overall doing very well.  Her exam is markedly improved from recent hospitalization.  She has some hyperpigmented tissue on  the right vulva most consistent with scarring.  We will plan to see her back in 4 months to ensure stability of her exam.  At that time, if doing well, she will be discharged to her other providers.  20 minutes of total time was spent for this patient encounter, including preparation, face-to-face counseling with the patient and coordination of care, and documentation of the encounter.  Comer Dollar, MD  Division of Gynecologic Oncology  Department of Obstetrics and Gynecology  [facility]   [facility]als

## 2024-01-15 ENCOUNTER — Inpatient Hospital Stay: Attending: Gynecologic Oncology | Admitting: Gynecologic Oncology

## 2024-01-15 ENCOUNTER — Telehealth: Payer: Self-pay

## 2024-01-15 NOTE — Telephone Encounter (Signed)
 LVM x2 for Ms.Glazier to call our office in regards to her scheduled appointment with Eleanor Epps NP.

## 2024-01-22 ENCOUNTER — Other Ambulatory Visit: Payer: Self-pay | Admitting: Internal Medicine

## 2024-01-22 DIAGNOSIS — E1121 Type 2 diabetes mellitus with diabetic nephropathy: Secondary | ICD-10-CM

## 2024-01-22 NOTE — Telephone Encounter (Unsigned)
 Copied from CRM #[id number]. Topic: Clinical - Medication Refill >> Jan 22, 2024  2:36 PM Corin V wrote: Medication: insulin  isophane & regular human KwikPen (HUMULIN  70/30 KWIKPEN) (70-30) 100 UNIT/ML KwikPen atenolol  (TENORMIN ) tablet 50 mg  [[id number]]  Has the patient contacted their pharmacy? Yes- need PCP authorization (Agent: If no, request that the patient contact the pharmacy for the refill. If patient does not wish to contact the pharmacy document the reason why and proceed with request.) (Agent: If yes, when and what did the pharmacy advise?)  This is the patient's preferred pharmacy:  [facility] [address], [place] - [address] [place] [address] Phone: [phone] Fax: [phone]  Is this the correct pharmacy for this prescription? Yes If no, delete pharmacy and type the correct one.   Has the prescription been filled recently? No  Is the patient out of the medication? Yes  Has the patient been seen for an appointment in the last year OR does the patient have an upcoming appointment? Yes  Can we respond through MyChart? No  Agent: Please be advised that Rx refills may take up to 3 business days. We ask that you follow-up with your pharmacy.

## 2024-01-23 MED ORDER — HUMULIN 70/30 KWIKPEN (70-30) 100 UNIT/ML ~~LOC~~ SUPN
10.0000 [IU] | PEN_INJECTOR | Freq: Two times a day (BID) | SUBCUTANEOUS | 0 refills | Status: AC
Start: 2024-01-23 — End: ?

## 2024-01-23 NOTE — Telephone Encounter (Signed)
 Has not been seen in [facility] since June 2024. Multiple hospitalizations since that time, one for DKA due to running out of insulin . Will refill insulin  with plan for f/u scheduled 02/01/24. Atenolol  on hold since hospitalization for syncope 10/2023, continued to hold at cardiology f/u 11/2023. Will need to see her in f/u to discuss if refills are appropriate.

## 2024-01-28 ENCOUNTER — Telehealth: Payer: Self-pay | Admitting: *Deleted

## 2024-01-28 NOTE — Telephone Encounter (Signed)
 RTC to patient.  Informed her that she needs to keep an appointment in the Clinics.  She has an appointment scheduled for 02/01/2024.  Patient was advised to keep that appointment to continue getting refills .

## 2024-01-30 DIAGNOSIS — Z9181 History of falling: Secondary | ICD-10-CM | POA: Diagnosis not present

## 2024-01-30 DIAGNOSIS — R2681 Unsteadiness on feet: Secondary | ICD-10-CM | POA: Diagnosis not present

## 2024-01-30 DIAGNOSIS — M6281 Muscle weakness (generalized): Secondary | ICD-10-CM | POA: Diagnosis not present

## 2024-01-30 DIAGNOSIS — S42221D 2-part displaced fracture of surgical neck of right humerus, subsequent encounter for fracture with routine healing: Secondary | ICD-10-CM | POA: Diagnosis not present

## 2024-02-01 ENCOUNTER — Ambulatory Visit

## 2024-02-05 DIAGNOSIS — I10 Essential (primary) hypertension: Secondary | ICD-10-CM | POA: Diagnosis not present

## 2024-02-16 ENCOUNTER — Observation Stay (HOSPITAL_COMMUNITY)
Admission: EM | Admit: 2024-02-16 | Discharge: 2024-02-17 | Disposition: A | Discharge status: Home / Self Care | Attending: Internal Medicine | Admitting: Internal Medicine

## 2024-02-16 ENCOUNTER — Encounter (HOSPITAL_COMMUNITY): Payer: Self-pay | Admitting: Emergency Medicine

## 2024-02-16 ENCOUNTER — Other Ambulatory Visit: Payer: Self-pay

## 2024-02-16 DIAGNOSIS — Z794 Long term (current) use of insulin: Secondary | ICD-10-CM | POA: Diagnosis not present

## 2024-02-16 DIAGNOSIS — N1832 Chronic kidney disease, stage 3b: Secondary | ICD-10-CM | POA: Diagnosis not present

## 2024-02-16 DIAGNOSIS — E039 Hypothyroidism, unspecified: Secondary | ICD-10-CM | POA: Diagnosis present

## 2024-02-16 DIAGNOSIS — Z7984 Long term (current) use of oral hypoglycemic drugs: Secondary | ICD-10-CM | POA: Diagnosis not present

## 2024-02-16 DIAGNOSIS — E1122 Type 2 diabetes mellitus with diabetic chronic kidney disease: Secondary | ICD-10-CM | POA: Diagnosis not present

## 2024-02-16 DIAGNOSIS — Z79899 Other long term (current) drug therapy: Secondary | ICD-10-CM | POA: Insufficient documentation

## 2024-02-16 DIAGNOSIS — E876 Hypokalemia: Secondary | ICD-10-CM | POA: Diagnosis not present

## 2024-02-16 DIAGNOSIS — E1165 Type 2 diabetes mellitus with hyperglycemia: Secondary | ICD-10-CM

## 2024-02-16 DIAGNOSIS — I129 Hypertensive chronic kidney disease with stage 1 through stage 4 chronic kidney disease, or unspecified chronic kidney disease: Secondary | ICD-10-CM | POA: Diagnosis not present

## 2024-02-16 DIAGNOSIS — Z8679 Personal history of other diseases of the circulatory system: Secondary | ICD-10-CM

## 2024-02-16 DIAGNOSIS — R112 Nausea with vomiting, unspecified: Secondary | ICD-10-CM | POA: Diagnosis not present

## 2024-02-16 DIAGNOSIS — D72829 Elevated white blood cell count, unspecified: Secondary | ICD-10-CM | POA: Diagnosis present

## 2024-02-16 DIAGNOSIS — R110 Nausea: Secondary | ICD-10-CM | POA: Diagnosis not present

## 2024-02-16 DIAGNOSIS — R1111 Vomiting without nausea: Secondary | ICD-10-CM | POA: Diagnosis not present

## 2024-02-16 DIAGNOSIS — E119 Type 2 diabetes mellitus without complications: Secondary | ICD-10-CM

## 2024-02-16 DIAGNOSIS — E1142 Type 2 diabetes mellitus with diabetic polyneuropathy: Secondary | ICD-10-CM

## 2024-02-16 LAB — COMPREHENSIVE METABOLIC PANEL WITH GFR
ALT: 10 U/L (ref 0–44)
AST: 25 U/L (ref 15–41)
Albumin: 4.2 g/dL (ref 3.5–5.0)
Alkaline Phosphatase: 111 U/L (ref 38–126)
Anion gap: 13 (ref 5–15)
BUN: 15 mg/dL (ref 8–23)
CO2: 29 mmol/L (ref 22–32)
Calcium: 9.1 mg/dL (ref 8.9–10.3)
Chloride: 98 mmol/L (ref 98–111)
Creatinine, Ser: 0.92 mg/dL (ref 0.44–1.00)
GFR, Estimated: >60 mL/min (ref >60)
Glucose, Bld: 280 mg/dL — ABNORMAL HIGH (ref 70–99)
Potassium: 2.5 mmol/L — CL (ref 3.5–5.1)
Sodium: 141 mmol/L (ref 135–145)
Total Bilirubin: 0.4 mg/dL (ref 0.0–1.2)
Total Protein: 7.5 g/dL (ref 6.5–8.1)

## 2024-02-16 LAB — URINALYSIS, ROUTINE W REFLEX MICROSCOPIC
Bacteria, UA: NONE SEEN
Bilirubin Urine: NEGATIVE
Glucose, UA: >=500 mg/dL — AB
Hgb urine dipstick: NEGATIVE
Ketones, ur: NEGATIVE mg/dL
Leukocytes,Ua: NEGATIVE
Nitrite: NEGATIVE
Protein, ur: 100 mg/dL — AB
Specific Gravity, Urine: 1.014 (ref 1.005–1.030)
pH: 7 (ref 5.0–8.0)

## 2024-02-16 LAB — CBC
HCT: 44.6 % (ref 36.0–46.0)
Hemoglobin: 15.4 g/dL — ABNORMAL HIGH (ref 12.0–15.0)
MCH: 29.6 pg (ref 26.0–34.0)
MCHC: 34.5 g/dL (ref 30.0–36.0)
MCV: 85.8 fL (ref 80.0–100.0)
Platelets: 255 K/uL (ref 150–400)
RBC: 5.2 MIL/uL — ABNORMAL HIGH (ref 3.87–5.11)
RDW: 12.6 % (ref 11.5–15.5)
WBC: 12.3 K/uL — ABNORMAL HIGH (ref 4.0–10.5)
nRBC: 0 % (ref 0.0–0.2)

## 2024-02-16 LAB — MAGNESIUM: Magnesium: 1.6 mg/dL — ABNORMAL LOW (ref 1.7–2.4)

## 2024-02-16 LAB — BETA-HYDROXYBUTYRIC ACID: Beta-Hydroxybutyric Acid: 0.05 mmol/L (ref 0.05–0.27)

## 2024-02-16 MED ORDER — ONDANSETRON HCL 4 MG/2ML IJ SOLN
4.0000 mg | Freq: Once | INTRAMUSCULAR | Status: AC
  Administered 2024-02-16: 4 mg via INTRAVENOUS
  Filled 2024-02-16: qty 2

## 2024-02-16 MED ORDER — MAGNESIUM SULFATE 2 GM/50ML IV SOLN
2.0000 g | Freq: Once | INTRAVENOUS | Status: AC
  Administered 2024-02-16: 2 g via INTRAVENOUS
  Filled 2024-02-16: qty 50

## 2024-02-16 MED ORDER — POTASSIUM CHLORIDE IN NACL 40-0.9 MEQ/L-% IV SOLN
Freq: Once | INTRAVENOUS | Status: AC
  Filled 2024-02-16: qty 1000

## 2024-02-16 NOTE — ED Triage Notes (Signed)
 BIB EMS from home.  N/V x 30 min after eating [employer] spaghetti.  CBG 361.  Son reported this happened 6 months ago when her diabetes was out of control.

## 2024-02-16 NOTE — ED Provider Notes (Signed)
 [place] EMERGENCY DEPARTMENT AT [facility] Provider Note   CSN: [id number] Arrival date & time: 02/16/24  2109     Patient presents with: Emesis   Jennifer Ingram is a 73 y.o. female.  Patient with past medical history significant for type II DM uncontrolled with an A1c of 11.1, hypertension, CKD stage IIIb, hypokalemia presents to the emergency department via EMS complaining of 2 episodes of emesis after eating spaghetti this evening.  This began 30 minutes after eating and just prior to arrival.  Her son reports she had similar symptoms 6 months ago when her diabetes was out of control.  The patient does not endorse abdominal pain, chest pain, shortness of breath, fevers, urinary symptoms.  She states her chief complaint is the nausea and vomiting.  {Add pertinent medical, surgical, social history, OB history to HPI:32947}  Emesis      Prior to Admission medications   Medication Sig Start Date End Date Taking? Authorizing Provider  Accu-Chek Softclix Lancets lancets Check blood sugar 3 times a day 01/06/21   Susen Pastor, MD  Continuous Blood Gluc Receiver (FREESTYLE LIBRE 3 READER) DEVI USE AS DIRECTED 07/13/22   Masters, Izetta, DO  Continuous Glucose Sensor (FREESTYLE LIBRE 3 SENSOR) MISC PLACE SENSOR ON THE SKIN EVERY 14 DAYS TO CHECK GLUXOSE CONTINUOUSLY 07/30/23   Masters, Izetta, DO  EUTHYROX  50 MCG tablet Take 1 tablet by mouth once daily 10/18/23   Masters, Izetta, DO  gabapentin  (NEURONTIN ) 300 MG capsule Take 300 mg by mouth at bedtime. 07/28/23   [provider]  glucose blood (ACCU-CHEK GUIDE) test strip Check blood sugar 3 times per day 01/06/21   Susen Pastor, MD  HYDROcodone -acetaminophen  (NORCO/VICODIN) 5-325 MG tablet Take 1 tablet by mouth every 6 (six) hours as needed for moderate pain (pain score 4-6). 11/16/23   Briana Elgin LABOR, MD  insulin  isophane & regular human KwikPen (HUMULIN  70/30 KWIKPEN) (70-30) 100 UNIT/ML KwikPen Inject 10 Units  into the skin 2 (two) times daily. 01/23/24   Karna Fellows, MD  Insulin  Pen Needle (PEN NEEDLES) 31G X 5 MM MISC 1 each by Does not apply route daily. 12/05/22   Masters, Katie, DO  ondansetron  (ZOFRAN -ODT) 4 MG disintegrating tablet Take 1 tablet (4 mg total) by mouth every 8 (eight) hours as needed for nausea or vomiting. 12/01/23   Kay Kemps, MD  oxyCODONE  (ROXICODONE ) 5 MG immediate release tablet Take 1 tablet (5 mg total) by mouth every 4 (four) hours as needed for moderate pain (pain score 4-6). 12/01/23 11/30/24  Kay Kemps, MD  PARoxetine  (PAXIL ) 40 MG tablet TAKE 1 TABLET BY MOUTH ONCE DAILY AFTER BREAKFAST 07/23/22   Masters, Izetta, DO  rosuvastatin  (CRESTOR ) 20 MG tablet Take 1 tablet (20 mg total) by mouth daily. 07/23/23   Will Almarie MATSU, MD    Allergies: Patient has no known allergies.    Review of Systems  Gastrointestinal:  Positive for vomiting.    Updated Vital Signs BP (!) 185/95   Pulse 74   Temp (!) 97.5 F (36.4 C) (Oral)   Resp 13   SpO2 97%   Physical Exam Vitals and nursing note reviewed.  Constitutional:      General: She is not in acute distress.    Appearance: She is well-developed.  HENT:     Head: Normocephalic and atraumatic.  Eyes:     Conjunctiva/sclera: Conjunctivae normal.  Cardiovascular:     Rate and Rhythm: Normal rate and regular rhythm.  Pulmonary:  Effort: Pulmonary effort is normal. No respiratory distress.     Breath sounds: Normal breath sounds.  Abdominal:     Palpations: Abdomen is soft.     Tenderness: There is no abdominal tenderness.  Musculoskeletal:        General: No swelling.     Cervical back: Neck supple.  Skin:    General: Skin is warm and dry.     Capillary Refill: Capillary refill takes less than 2 seconds.  Neurological:     Mental Status: She is alert and oriented to person, place, and time.  Psychiatric:        Mood and Affect: Mood normal.     (all labs ordered are listed, but only abnormal results  are displayed) Labs Reviewed  COMPREHENSIVE METABOLIC PANEL WITH GFR - Abnormal; Notable for the following components:      Result Value   Potassium 2.5 (*)    Glucose, Bld 280 (*)    All other components within normal limits  CBC - Abnormal; Notable for the following components:   WBC 12.3 (*)    RBC 5.20 (*)    Hemoglobin 15.4 (*)    All other components within normal limits  URINALYSIS, ROUTINE W REFLEX MICROSCOPIC  BETA-HYDROXYBUTYRIC ACID  MAGNESIUM    EKG: None  Radiology: No results found.  {Document cardiac monitor, telemetry assessment procedure when appropriate:32947} Procedures   Medications Ordered in the ED  0.9 % NaCl with KCl 40 mEq / L  infusion (has no administration in time range)  ondansetron  (ZOFRAN ) injection 4 mg (4 mg Intravenous Given 02/16/24 2228)      {Click here for ABCD2, HEART and other calculators REFRESH Note before signing:1}                              Medical Decision Making Amount and/or Complexity of Data Reviewed Labs: ordered.  Risk Prescription drug management.   This patient presents to the ED for concern of nausea and vomiting, this involves an extensive number of treatment options, and is a complaint that carries with it a high risk of complications and morbidity.  The differential diagnosis includes hyperglycemia, cholecystitis, gastritis, gastroenteritis, others   Co morbidities / Chronic conditions that complicate the patient evaluation  As noted in HPI   Additional history obtained:  Additional history obtained from EMR External records from outside source obtained and reviewed including primary care records showing A1c of 11.1   Lab Tests:  I Ordered, and personally interpreted labs.  The pertinent results include: Potassium 2.5, glucose 280, white count of 12,300   Imaging Studies ordered:  I ordered imaging studies including ***  I independently visualized and interpreted imaging which showed *** I  agree with the radiologist interpretation   Cardiac Monitoring: / EKG:  The patient was maintained on a cardiac monitor.  I personally viewed and interpreted the cardiac monitored which showed an underlying rhythm of: Sinus rhythm   Problem List / ED Course / Critical interventions / Medication management   I ordered medication including Zofran , potassium Reevaluation of the patient after these medicines showed that the patient *** I have reviewed the patients home medicines and have made adjustments as needed   Consultations Obtained:  I requested consultation with the ***,  and discussed lab and imaging findings as well as pertinent plan - they recommend: ***   Social Determinants of Health:  ***   Test / Admission -  Considered:  ***   {Document critical care time when appropriate  Document review of labs and clinical decision tools ie CHADS2VASC2, etc  Document your independent review of radiology images and any outside records  Document your discussion with family members, caretakers and with consultants  Document social determinants of health affecting pt's care  Document your decision making why or why not admission, treatments were needed:32947:::1}   Final diagnoses:  None    ED Discharge Orders     None

## 2024-02-17 ENCOUNTER — Encounter (HOSPITAL_COMMUNITY): Payer: Self-pay | Admitting: Internal Medicine

## 2024-02-17 DIAGNOSIS — Z794 Long term (current) use of insulin: Secondary | ICD-10-CM | POA: Diagnosis not present

## 2024-02-17 DIAGNOSIS — E1142 Type 2 diabetes mellitus with diabetic polyneuropathy: Secondary | ICD-10-CM | POA: Diagnosis not present

## 2024-02-17 DIAGNOSIS — E876 Hypokalemia: Secondary | ICD-10-CM | POA: Diagnosis not present

## 2024-02-17 DIAGNOSIS — D72829 Elevated white blood cell count, unspecified: Secondary | ICD-10-CM | POA: Diagnosis not present

## 2024-02-17 DIAGNOSIS — Z8679 Personal history of other diseases of the circulatory system: Secondary | ICD-10-CM

## 2024-02-17 DIAGNOSIS — E039 Hypothyroidism, unspecified: Secondary | ICD-10-CM

## 2024-02-17 DIAGNOSIS — R112 Nausea with vomiting, unspecified: Secondary | ICD-10-CM | POA: Diagnosis present

## 2024-02-17 DIAGNOSIS — E119 Type 2 diabetes mellitus without complications: Secondary | ICD-10-CM

## 2024-02-17 LAB — COMPREHENSIVE METABOLIC PANEL WITH GFR
ALT: 8 U/L (ref 0–44)
AST: 22 U/L (ref 15–41)
Albumin: 3.9 g/dL (ref 3.5–5.0)
Alkaline Phosphatase: 98 U/L (ref 38–126)
Anion gap: 10 (ref 5–15)
BUN: 14 mg/dL (ref 8–23)
CO2: 29 mmol/L (ref 22–32)
Calcium: 8.5 mg/dL — ABNORMAL LOW (ref 8.9–10.3)
Chloride: 104 mmol/L (ref 98–111)
Creatinine, Ser: 0.91 mg/dL (ref 0.44–1.00)
GFR, Estimated: >60 mL/min (ref >60)
Glucose, Bld: 255 mg/dL — ABNORMAL HIGH (ref 70–99)
Potassium: 4 mmol/L (ref 3.5–5.1)
Sodium: 142 mmol/L (ref 135–145)
Total Bilirubin: 0.4 mg/dL (ref 0.0–1.2)
Total Protein: 6.8 g/dL (ref 6.5–8.1)

## 2024-02-17 LAB — CBC WITH DIFFERENTIAL/PLATELET
Abs Immature Granulocytes: 0.06 K/uL (ref 0.00–0.07)
Basophils Absolute: 0.1 K/uL (ref 0.0–0.1)
Basophils Relative: 0 %
Eosinophils Absolute: 0.2 K/uL (ref 0.0–0.5)
Eosinophils Relative: 1 %
HCT: 41.8 % (ref 36.0–46.0)
Hemoglobin: 14.1 g/dL (ref 12.0–15.0)
Immature Granulocytes: 0 %
Lymphocytes Relative: 24 %
Lymphs Abs: 3.2 K/uL (ref 0.7–4.0)
MCH: 29.1 pg (ref 26.0–34.0)
MCHC: 33.7 g/dL (ref 30.0–36.0)
MCV: 86.2 fL (ref 80.0–100.0)
Monocytes Absolute: 0.9 K/uL (ref 0.1–1.0)
Monocytes Relative: 7 %
Neutro Abs: 9.1 K/uL — ABNORMAL HIGH (ref 1.7–7.7)
Neutrophils Relative %: 68 %
Platelets: 276 K/uL (ref 150–400)
RBC: 4.85 MIL/uL (ref 3.87–5.11)
RDW: 12.6 % (ref 11.5–15.5)
WBC: 13.5 K/uL — ABNORMAL HIGH (ref 4.0–10.5)
nRBC: 0 % (ref 0.0–0.2)

## 2024-02-17 LAB — CBG MONITORING, ED
Glucose-Capillary: 195 mg/dL — ABNORMAL HIGH (ref 70–99)
Glucose-Capillary: 307 mg/dL — ABNORMAL HIGH (ref 70–99)
Glucose-Capillary: 252 mg/dL — ABNORMAL HIGH (ref 70–99)

## 2024-02-17 LAB — TSH: TSH: 1.4 u[IU]/mL (ref 0.350–4.500)

## 2024-02-17 LAB — MAGNESIUM: Magnesium: 1.9 mg/dL (ref 1.7–2.4)

## 2024-02-17 LAB — HEMOGLOBIN A1C
Hgb A1c MFr Bld: 11.1 % — ABNORMAL HIGH (ref 4.8–5.6)
Mean Plasma Glucose: 271.87 mg/dL

## 2024-02-17 MED ORDER — PAROXETINE HCL 20 MG PO TABS
40.0000 mg | ORAL_TABLET | Freq: Every day | ORAL | Status: DC
  Filled 2024-02-17: qty 2

## 2024-02-17 MED ORDER — LEVOTHYROXINE SODIUM 50 MCG PO TABS
50.0000 ug | ORAL_TABLET | Freq: Every day | ORAL | Status: DC
  Administered 2024-02-17: 50 ug via ORAL
  Filled 2024-02-17: qty 1

## 2024-02-17 MED ORDER — MELATONIN 3 MG PO TABS: 3.0000 mg | ORAL_TABLET | Freq: Every evening | ORAL | Status: DC | PRN

## 2024-02-17 MED ORDER — ONDANSETRON 4 MG PO TBDP: 4.0000 mg | ORAL_TABLET | Freq: Three times a day (TID) | ORAL | 0 refills | Status: DC | PRN

## 2024-02-17 MED ORDER — GABAPENTIN 300 MG PO CAPS
300.0000 mg | ORAL_CAPSULE | Freq: Two times a day (BID) | ORAL | Status: DC
  Administered 2024-02-17: 300 mg via ORAL
  Filled 2024-02-17: qty 1

## 2024-02-17 MED ORDER — ATENOLOL 50 MG PO TABS
50.0000 mg | ORAL_TABLET | Freq: Two times a day (BID) | ORAL | Status: DC
  Administered 2024-02-17: 50 mg via ORAL
  Filled 2024-02-17: qty 1

## 2024-02-17 MED ORDER — ACETAMINOPHEN 325 MG PO TABS: 650.0000 mg | ORAL_TABLET | Freq: Four times a day (QID) | ORAL | 0 refills | Status: AC | PRN

## 2024-02-17 MED ORDER — ACETAMINOPHEN 650 MG RE SUPP: 650.0000 mg | Freq: Four times a day (QID) | RECTAL | Status: DC | PRN

## 2024-02-17 MED ORDER — POTASSIUM CHLORIDE 10 MEQ/100ML IV SOLN
10.0000 meq | INTRAVENOUS | Status: AC
  Administered 2024-02-17 (×2): 10 meq via INTRAVENOUS
  Filled 2024-02-17 (×2): qty 100

## 2024-02-17 MED ORDER — HYDRALAZINE HCL 20 MG/ML IJ SOLN: 10.0000 mg | Freq: Four times a day (QID) | INTRAMUSCULAR | Status: DC | PRN

## 2024-02-17 MED ORDER — ROSUVASTATIN CALCIUM 20 MG PO TABS
20.0000 mg | ORAL_TABLET | Freq: Every day | ORAL | Status: DC
  Administered 2024-02-17: 20 mg via ORAL
  Filled 2024-02-17: qty 1

## 2024-02-17 MED ORDER — ACETAMINOPHEN 325 MG PO TABS: 650.0000 mg | ORAL_TABLET | Freq: Four times a day (QID) | ORAL | Status: DC | PRN

## 2024-02-17 MED ORDER — SODIUM CHLORIDE 0.9 % IV SOLN: INTRAVENOUS | Status: AC

## 2024-02-17 MED ORDER — INSULIN GLARGINE-YFGN 100 UNIT/ML ~~LOC~~ SOLN
7.0000 [IU] | Freq: Two times a day (BID) | SUBCUTANEOUS | Status: DC
  Administered 2024-02-17: 7 [IU] via SUBCUTANEOUS
  Filled 2024-02-17 (×2): qty 0.07

## 2024-02-17 MED ORDER — INSULIN ASPART 100 UNIT/ML IJ SOLN
0.0000 [IU] | Freq: Every day | INTRAMUSCULAR | Status: DC
  Filled 2024-02-17: qty 0.05

## 2024-02-17 MED ORDER — ONDANSETRON HCL 4 MG/2ML IJ SOLN: 4.0000 mg | Freq: Four times a day (QID) | INTRAMUSCULAR | Status: DC | PRN

## 2024-02-17 MED ORDER — POTASSIUM CHLORIDE CRYS ER 20 MEQ PO TBCR
40.0000 meq | EXTENDED_RELEASE_TABLET | Freq: Once | ORAL | Status: AC
  Administered 2024-02-17: 40 meq via ORAL
  Filled 2024-02-17: qty 2

## 2024-02-17 MED ORDER — INSULIN ASPART 100 UNIT/ML IJ SOLN
0.0000 [IU] | Freq: Three times a day (TID) | INTRAMUSCULAR | Status: DC
  Administered 2024-02-17: 5 [IU] via SUBCUTANEOUS
  Administered 2024-02-17: 8 [IU] via SUBCUTANEOUS
  Filled 2024-02-17: qty 0.15

## 2024-02-17 MED ORDER — METOCLOPRAMIDE HCL 5 MG/ML IJ SOLN: 10.0000 mg | Freq: Four times a day (QID) | INTRAMUSCULAR | Status: DC | PRN

## 2024-02-17 NOTE — H&P (Signed)
 History and Physical      Jennifer Ingram FMW:[id number] DOB: [birth year] DOA: 02/16/2024; DOS: 02/17/2024  PCP: System, Provider Not In (will further assess) Patient coming from: home   I have personally briefly reviewed patient's old medical records in [facility]  Chief Complaint: Nausea vomiting  HPI: Jennifer Ingram is a 73 y.o. female with medical history significant for with controlled type 2 diabetes mellitus, essential hypertension, hyperlipidemia, acquired hypothyroidism, who is admitted to [facility] on 02/16/2024 with hypokalemia after presenting from home to [facility] ED complaining of nausea vomiting.   The patient reports recurrent nausea that started around suppertime yesterday, on 02/16/2024, which has resulted in 2-3 episodes of nonbloody, nonbilious emesis.  She notes that the nausea/vomiting started shortly after consuming her supper.  Denies any associated abdominal discomfort nor any recent subjective fever, chills, rigors, or generalized myalgias.  No associated any chest pain, shortness of breath, palpitations, diaphoresis, dizziness, SOB, or syncope.  No recent cough, dysuria, gross hematuria, nor any recent neck stiffness.  She has a history of poorly controlled type 2 diabetes mellitus, with most recent hemoglobin A1c found to be greater than 15.5% when checked in March 2025, with her diabetes noted to be complicated by diabetic peripheral polyneuropathy.  She also has a history of acquired hypothyroidism.     ED Course:  Vital signs in the ED were notable for the following: Afebrile; heart rates in the 70s; systolic pressures in the 140s to 180s; respiratory rate 15-20, oxygen saturation 94 to 97% on room air.  Labs were notable for the following: CMP was notable for the following: Sodium 141, potassium 2.5, bicarbonate 29, anion gap 13, creatinine 0.92, glucose 280, liver enzymes were within normal limits.  Magnesium level 1.6.  CBG found to be 195.   Beta-hydroxybutyrate acid 0.05.  CBC notable for white blood cell count 12,300, urinalysis notable for no white blood cells, leukocyte esterase/nitrate negative, no evidence of bacteria.  Per my interpretation, EKG in ED demonstrated the following: Sinus rhythm with heart rate 73, no evidence of T wave or ST changes, including no evidence of ST elevation.  Imaging in the ED, per corresponding formal radiology read, was notable for the following: No imaging performed in the ED today.  While in the ED, the following were administered: Normal saline x 500 cc bolus, potassium chloride  40 mill colons IV, magnesium sulfate 2 g IV over 2 hours.  Subsequently, the patient was admitted for further evaluation management of presenting hypokalemia as well as hypomagnesemia in the setting of presenting nausea/vomiting.    Review of Systems: As per HPI otherwise 10 point review of systems negative.   Past Medical History:  Diagnosis Date   Arthritis    bilateral knees and back;   Depression    on meds, working well   Diabetes mellitus without complication (HCC) 05/31/2012   on meds   Heart palpitations    rapid - hx, no current problem per pt on 11/28/23   Hyperlipidemia    on meds   Hypertension    on meds   Hypothyroidism     Past Surgical History:  Procedure Laterality Date   APPENDECTOMY  1968   CESAREAN SECTION  1980   CHOLECYSTECTOMY  1985   ORIF HUMERUS FRACTURE Right 11/30/2023   Procedure: OPEN REDUCTION INTERNAL FIXATION (ORIF) PROXIMAL HUMERUS FRACTURE, RIGHT;  Surgeon: Sharl Selinda Dover, MD;  Location: [facility] OR;  Service: Orthopedics;  Laterality: Right;   TONSILLECTOMY  1960  Social History:  reports that she has never smoked. She has never used smokeless tobacco. She reports that she does not drink alcohol and does not use drugs.   No Known Allergies  Family History  Problem Relation Age of Onset   Cancer Mother        leukemia   Cancer Father        lung   Breast  cancer Neg Hx    Colon polyps Neg Hx    Crohn's disease Neg Hx    Esophageal cancer Neg Hx    Stomach cancer Neg Hx    Rectal cancer Neg Hx     Family history reviewed and not pertinent    Prior to Admission medications   Medication Sig Start Date End Date Taking? Authorizing Provider  Accu-Chek Softclix Lancets lancets Check blood sugar 3 times a day 01/06/21   Susen Pastor, MD  Continuous Blood Gluc Receiver (FREESTYLE LIBRE 3 READER) DEVI USE AS DIRECTED 07/13/22   Masters, Izetta, DO  Continuous Glucose Sensor (FREESTYLE LIBRE 3 SENSOR) MISC PLACE SENSOR ON THE SKIN EVERY 14 DAYS TO CHECK GLUXOSE CONTINUOUSLY 07/30/23   Masters, Katie, DO  EUTHYROX  50 MCG tablet Take 1 tablet by mouth once daily 10/18/23   Masters, Izetta, DO  gabapentin  (NEURONTIN ) 300 MG capsule Take 300 mg by mouth at bedtime. 07/28/23   [provider]  glucose blood (ACCU-CHEK GUIDE) test strip Check blood sugar 3 times per day 01/06/21   Susen Pastor, MD  HYDROcodone -acetaminophen  (NORCO/VICODIN) 5-325 MG tablet Take 1 tablet by mouth every 6 (six) hours as needed for moderate pain (pain score 4-6). 11/16/23   Briana Elgin LABOR, MD  insulin  isophane & regular human KwikPen (HUMULIN  70/30 KWIKPEN) (70-30) 100 UNIT/ML KwikPen Inject 10 Units into the skin 2 (two) times daily. 01/23/24   Karna Fellows, MD  Insulin  Pen Needle (PEN NEEDLES) 31G X 5 MM MISC 1 each by Does not apply route daily. 12/05/22   Masters, Katie, DO  ondansetron  (ZOFRAN -ODT) 4 MG disintegrating tablet Take 1 tablet (4 mg total) by mouth every 8 (eight) hours as needed for nausea or vomiting. 12/01/23   Kay Kemps, MD  oxyCODONE  (ROXICODONE ) 5 MG immediate release tablet Take 1 tablet (5 mg total) by mouth every 4 (four) hours as needed for moderate pain (pain score 4-6). 12/01/23 11/30/24  Kay Kemps, MD  PARoxetine  (PAXIL ) 40 MG tablet TAKE 1 TABLET BY MOUTH ONCE DAILY AFTER BREAKFAST 07/23/22   Masters, Izetta, DO  rosuvastatin  (CRESTOR ) 20 MG  tablet Take 1 tablet (20 mg total) by mouth daily. 07/23/23   Will Almarie MATSU, MD     Objective    Physical Exam: Vitals:   02/16/24 2200 02/16/24 2230 02/16/24 2330 02/17/24 0112  BP: (!) 176/92 (!) 185/95 (!) 143/116   Pulse: 72 74 74   Resp: 17 13 15    Temp:    97.8 F (36.6 C)  TempSrc:    Oral  SpO2: 94% 97% 94%     General: appears to be stated age; alert, oriented Skin: warm, dry, no rash Head:  AT/[place] Mouth:  Oral mucosa membranes appear dry, normal dentition Neck: supple; trachea midline Heart:  RRR; did not appreciate any M/R/G Lungs: CTAB, did not appreciate any wheezes, rales, or rhonchi Abdomen: + BS; soft, ND, NT Vascular: 2+ pedal pulses b/l; 2+ radial pulses b/l Extremities: no peripheral edema, no muscle wasting      Labs on Admission: I have personally reviewed following labs  and imaging studies  CBC: Recent Labs  Lab 02/16/24 2127  WBC 12.3*  HGB 15.4*  HCT 44.6  MCV 85.8  PLT 255   Basic Metabolic Panel: Recent Labs  Lab 02/16/24 2127 02/16/24 2222  NA 141  --   K 2.5*  --   CL 98  --   CO2 29  --   GLUCOSE 280*  --   BUN 15  --   CREATININE 0.92  --   CALCIUM  9.1  --   MG  --  1.6*   GFR: CrCl cannot be calculated (Unknown ideal weight.). Liver Function Tests: Recent Labs  Lab 02/16/24 2127  AST 25  ALT 10  ALKPHOS 111  BILITOT 0.4  PROT 7.5  ALBUMIN 4.2   No results for input(s): LIPASE, AMYLASE in the last 168 hours. No results for input(s): AMMONIA in the last 168 hours. Coagulation Profile: No results for input(s): INR, PROTIME in the last 168 hours. Cardiac Enzymes: No results for input(s): CKTOTAL, CKMB, CKMBINDEX, TROPONINI in the last 168 hours. BNP (last 3 results) No results for input(s): PROBNP in the last 8760 hours. HbA1C: No results for input(s): HGBA1C in the last 72 hours. CBG: No results for input(s): GLUCAP in the last 168 hours. Lipid Profile: No results for  input(s): CHOL, HDL, LDLCALC, TRIG, CHOLHDL, LDLDIRECT in the last 72 hours. Thyroid  Function Tests: No results for input(s): TSH, T4TOTAL, FREET4, T3FREE, THYROIDAB in the last 72 hours. Anemia Panel: No results for input(s): VITAMINB12, FOLATE, FERRITIN, TIBC, IRON, RETICCTPCT in the last 72 hours. Urine analysis:    Component Value Date/Time   COLORURINE STRAW (A) 02/16/2024 2232   APPEARANCEUR CLEAR 02/16/2024 2232   LABSPEC 1.014 02/16/2024 2232   PHURINE 7.0 02/16/2024 2232   GLUCOSEU >=500 (A) 02/16/2024 2232   HGBUR NEGATIVE 02/16/2024 2232   BILIRUBINUR NEGATIVE 02/16/2024 2232   KETONESUR NEGATIVE 02/16/2024 2232   PROTEINUR 100 (A) 02/16/2024 2232   UROBILINOGEN 0.2 11/05/2013 1104   NITRITE NEGATIVE 02/16/2024 2232   LEUKOCYTESUR NEGATIVE 02/16/2024 2232    Radiological Exams on Admission: No results found.    Assessment/Plan    Principal Problem:   Hypokalemia Active Problems:   Leukocytosis   Acquired hypothyroidism   Hypomagnesemia   Nausea & vomiting   DM2 (diabetes mellitus, type 2) (HCC)   History of essential hypertension      #) Hypokalemia: presenting potassium level noted to be 2.5, in the context of multiple episodes of presenting nausea/vomiting, compounded by concomitant hypomagnesemia.  She has received a total of 40 mill equivalents of IV potassium chloride  in the ED.  Plan: monitor on tele. KCl 40 meq p.o. x 1 dose now as well as an additional 20 mEq of IV potassium chloride  over 2 hours.  Further evaluation management of presenting hypomagnesemia, as below.  CMP, mag level in the AM.                      #) Hypomagnesemia: presenting serum mag level noted to be 1.6, prompting administration of 2 g of IV magnesium sulfate in the ED this evening.    Plan:Monitor on tele. Repeat serum mag level in the AM.                      #) nausea/vomiting: 2-3 episodes of  nonbloody, nonbilious vomiting over the course of the last 12 hours, which started shortly after consumption of her supper.  Not associate with  any abdominal discomfort, and no clinical evidence to suggest bowel obstruction.  Given her history of poorly controlled type 2 diabetes mellitus as well as diabetic peripheral polyneuropathy, she is at risk for diabetic gastroparesis.  Nausea now improving, and the patient has been able to tolerate some sips of water in the ED.  Plan: As needed IV Zofran .  As needed IV Reglan  for refractory nausea/vomiting.  Normal saline at 75 cc/h x 10 hours.  Check urine drug screen, hemoglobin A1c level, as well as TSH.                    #) Leukocytosis: Presenting white blood cell count found to be mildly elevated at 12,300.  No evidence of underlying infectious process at this time, including urinalysis that is inconsistent with UTI, which presents with no acute respiratory symptoms to increase index of suspicion for underlying pneumonia or viral respiratory infection.  In the absence of any evidence of underlying infectious process, criteria for sepsis are not currently met.  Additionally, she appears hemodynamically stable.  Consequently, we will refrain from administration of IV antibiotics at this time.  Rather, suspect mild elevation white blood cell count as a reactive mechanism in the context of her recurrent nausea/vomiting, as well as potential contribution from hemoconcentration as a result of ensuing development of mild dehydration from her presenting increase in GI losses.  Plan: IV fluids, as above.  Monitor strict I's and O's and daily weights.  Repeat CBC in the morning.                    #) Type 2 Diabetes Mellitus: documented history of such. Home insulin  regimen: 70/30 insulin , 10 units SQ twice daily.  This is in addition to sliding scale hemologic 3 times daily with meals.. Home oral hypoglycemic agents: None.  presenting blood sugar: 195 on presenting CBG, with no evidence of underlying DKA. Most recent A1c noted to be 15.5% when checked in March 2025.  History of diabetes is complicated by diabetic peripheral polyneuropathy for which she is on gabapentin  at home.  Plan: accuchecks QAC and HS with low dose SSI.  Levemir 7 units SQ twice daily.  Continue on gabapentin .  Add on hemoglobin A1c level.                      #) Essential Hypertension: documented h/o such, with outpatient antihypertensive regimen including atenolol .  SBP's in the ED today: 140s to 180s mmHg. heart rates in the 70s.  Plan: Close monitoring of subsequent BP via routine VS. resume home beta-blocker.  Monitor strict I's and O's and daily weights.  Monitor on telemetry.                        #) acquired hypothyroidism: documented h/o such, on Euthyrox  as outpatient.   Plan: cont home Euthyrox .  Check TSH.        DVT prophylaxis: SCD's   Code Status: Full code Family Communication: none Disposition Plan: Per Rounding Team Consults called: none;  Admission status: obs    I SPENT GREATER THAN 75  MINUTES IN CLINICAL CARE TIME/MEDICAL DECISION-MAKING IN COMPLETING THIS ADMISSION.     Taunia Frasco B Breon Rehm DO [facility] From 7PM - 7AM   02/17/2024, 1:46 AM

## 2024-02-17 NOTE — ED Notes (Signed)
 Patient resting quietly.  Inquired about breakfast and updated.

## 2024-02-17 NOTE — Inpatient Diabetes Management (Signed)
 Inpatient Diabetes Program Recommendations  AACE/ADA: New Consensus Statement on Inpatient Glycemic Control (2015)  Target Ranges:  Prepandial:   less than 140 mg/dL      Peak postprandial:   less than 180 mg/dL (1-2 hours)      Critically ill patients:  140 - 180 mg/dL   Lab Results  Component Value Date   GLUCAP 195 (H) 02/17/2024   HGBA1C 11.1 (H) 02/17/2024    Review of Glycemic Control  Diabetes history: DM 2 Outpatient Diabetes medications: 70/30 10 units bid, Humalog  0-10 units tid Current orders for Inpatient glycemic control:  Semglee  7 units bid Novolog  0-15 units tid Improved A1c 11.1% this admission, however still elevated  Pt poor to follow up, poor self management looking in chart. I believe the pt would benefit from an in person visit from coordinator rather than a phone call as Diabetes Coordinator works remotely over the weekend but is available via phone and pager. Will watch glucose trends on current regimen. Coordinator will see on 10/20.  Thanks,  Clotilda Bull RN, MSN, BC-ADM Inpatient Diabetes Coordinator Team Pager [phone] (8a-5p)

## 2024-02-17 NOTE — ED Notes (Signed)
 Patients brother Gerda is coming in 45 minutes to pick patient up.

## 2024-02-17 NOTE — ED Notes (Addendum)
 Pt vitals updated and pt moved to hall C

## 2024-02-17 NOTE — ED Notes (Signed)
 Pts brother is here and [place] notified.  She has taken care of this pt all day and she wanted to speak with the family when they came to pick her up.

## 2024-02-19 NOTE — Hospital Course (Signed)
 The patient is a 73 year old Caucasian female with past medical history significant for but not limited to uncontrolled diabetes mellitus type 2, essential hypertension, hyperlipidemia, acquired hypothyroidism presented to the hospital on 02/16/2024 after having some nausea vomiting.  She had 2-3 episodes of nonbloody nonbilious emesis after eating spaghetti.  Denies abdominal discomfort.  Recently was found to have a hemoglobin A1c of 15.5 back in March and has complications of diabetic peripheral polyneuropathy.  She is brought to the ED after vomiting and found to be afebrile.  Labs were notable for a sodium of 141 and potassium 2.5 and magnesium of 1.6.  She was given electrolyte replacement and IV fluids and antiemetics and she improved.  She tolerated her lunch and breakfast without issues and can be medically stable given that she is back to baseline and will need to follow-up with PCP within 1 to 2 weeks.  Assessment and Plan:  Nausea and Vomiting: Not recurrent. Improved. Given IVF Hydration with normal saline at 75 mL/h for 10 hours. Given Antiemetics. Ate meals w/o issue. D/C'd with no complaints and continued supportive care.   Hypokalemia:  Recent Labs  Lab 02/16/24 2127 02/17/24 0443  K 2.5* 4.0  -Replete.  Continue to monitor and trend and repeat CMP within 1 week  Hypomag:  Recent Labs  Lab 02/16/24 2222 02/17/24 0443  MG 1.6* 1.9  - Replete.  Continue monitoring trend and repeat magnesium levels in within 1 week  Leukocytosis: In the setting of vomiting. WBC Trend: Recent Labs  Lab 02/16/24 2127 02/17/24 0443  WBC 12.3* 13.5*  - No signs or symptoms of infection.  Continue monitoring trend and repeat CBC within 1 week  TDM2: Continued home insulin  regimen 7030 at discharge.  Not in DKA.  Recent hemoglobin A1c in March was 15.5.  Now hemoglobin A1c is 11.1.  Outpatient follow-up with diabetes education coordinator. CBG Trend:  Recent Labs  Lab 02/17/24 0212  02/17/24 0958 02/17/24 1223  GLUCAP 195* 307* 252*   Essential HTN: Resume Antihypertensives.   Acquired Hypothyroidism: TSH Was 1.400; Resume Home Levothyroxine 

## 2024-02-19 NOTE — Discharge Summary (Signed)
 Physician Discharge Summary   Patient: Jennifer Ingram MRN: [id number] DOB: 12/08/1950  Admit date:     02/16/2024  Discharge date: 02/17/2024  Discharge Physician: Alejandro Marker, DO   PCP: System, Provider Not In   Recommendations at discharge:   Follow-up with PCP within 1 to 2 weeks repeat CBC, CMP, mag, Phos within 1 week  Discharge Diagnoses: Principal Problem:   Hypokalemia Active Problems:   Leukocytosis   Acquired hypothyroidism   Hypomagnesemia   Nausea & vomiting   DM2 (diabetes mellitus, type 2) (HCC)   History of essential hypertension  Resolved Problems:   * No resolved hospital problems. [facility] Course: The patient is a 73 year old Caucasian female with past medical history significant for but not limited to uncontrolled diabetes mellitus type 2, essential hypertension, hyperlipidemia, acquired hypothyroidism presented to the hospital on 02/16/2024 after having some nausea vomiting.  She had 2-3 episodes of nonbloody nonbilious emesis after eating spaghetti.  Denies abdominal discomfort.  Recently was found to have a hemoglobin A1c of 15.5 back in March and has complications of diabetic peripheral polyneuropathy.  She is brought to the ED after vomiting and found to be afebrile.  Labs were notable for a sodium of 141 and potassium 2.5 and magnesium of 1.6.  She was given electrolyte replacement and IV fluids and antiemetics and she improved.  She tolerated her lunch and breakfast without issues and can be medically stable given that she is back to baseline and will need to follow-up with PCP within 1 to 2 weeks.  Assessment and Plan:  Nausea and Vomiting: Not recurrent. Improved. Given IVF Hydration with normal saline at 75 mL/h for 10 hours. Given Antiemetics. Ate meals w/o issue. D/C'd with no complaints and continued supportive care.   Hypokalemia:  Recent Labs  Lab 02/16/24 2127 02/17/24 0443  K 2.5* 4.0  -Replete.  Continue to monitor and trend and repeat  CMP within 1 week  Hypomag:  Recent Labs  Lab 02/16/24 2222 02/17/24 0443  MG 1.6* 1.9  - Replete.  Continue monitoring trend and repeat magnesium levels in within 1 week  Leukocytosis: In the setting of vomiting. WBC Trend: Recent Labs  Lab 02/16/24 2127 02/17/24 0443  WBC 12.3* 13.5*  - No signs or symptoms of infection.  Continue monitoring trend and repeat CBC within 1 week  TDM2: Continued home insulin  regimen 7030 at discharge.  Not in DKA.  Recent hemoglobin A1c in March was 15.5.  Now hemoglobin A1c is 11.1.  Outpatient follow-up with diabetes education coordinator. CBG Trend:  Recent Labs  Lab 02/17/24 0212 02/17/24 0958 02/17/24 1223  GLUCAP 195* 307* 252*   Essential HTN: Resume Antihypertensives.   Acquired Hypothyroidism: TSH Was 1.400; Resume Home Levothyroxine   Consultants: None Procedures performed: As delineated as above   Disposition: Home Diet recommendation:  Discharge Diet Orders (From admission, onward)     Start     Ordered   02/17/24 0000  Diet - low sodium heart healthy        02/17/24 1223   02/17/24 0000  Diet Carb Modified        02/17/24 1223           Cardiac and Carb modified diet DISCHARGE MEDICATION: Allergies as of 02/17/2024   No Known Allergies      Medication List     STOP taking these medications    oxyCODONE  5 MG immediate release tablet Commonly known as: Roxicodone   TAKE these medications    Accu-Chek Guide test strip Generic drug: glucose blood Check blood sugar 3 times per day   Accu-Chek Softclix Lancets lancets Check blood sugar 3 times a day   acetaminophen  325 MG tablet Commonly known as: TYLENOL  Take 2 tablets (650 mg total) by mouth every 6 (six) hours as needed for mild pain (pain score 1-3) (or Fever >/= 101).   atenolol  50 MG tablet Commonly known as: TENORMIN  Take 50 mg by mouth 2 (two) times daily.   Euthyrox  50 MCG tablet Generic drug: levothyroxine  Take 1 tablet by mouth  once daily   FreeStyle Libre 3 Reader Espiridion USE AS DIRECTED   FreeStyle Libre 3 Sensor Misc PLACE SENSOR ON THE SKIN EVERY 14 DAYS TO CHECK GLUXOSE CONTINUOUSLY   gabapentin  300 MG capsule Commonly known as: NEURONTIN  Take 300 mg by mouth 2 (two) times daily.   HumuLIN  70/30 KwikPen (70-30) 100 UNIT/ML KwikPen Generic drug: insulin  isophane & regular human KwikPen Inject 10 Units into the skin 2 (two) times daily.   insulin  lispro 100 UNIT/ML injection Commonly known as: HUMALOG  INJECT SUBCUTANEOUSLY THREE TIMES DAILY ON A SLIDING SCALE. BLOOD SUGAR 70-200 0 UNITS. 201-250 2 UNITS. 251-300 4 UNITS. 301-350 6 UNITS. 351-400 8 UNITS. 401-450 10 UNITS. IF GREATER THAN 60 UNITS CALL MD   ondansetron  4 MG disintegrating tablet Commonly known as: ZOFRAN -ODT Take 1 tablet (4 mg total) by mouth every 8 (eight) hours as needed for nausea or vomiting.   PARoxetine  40 MG tablet Commonly known as: PAXIL  TAKE 1 TABLET BY MOUTH ONCE DAILY AFTER BREAKFAST   Pen Needles 31G X 5 MM Misc 1 each by Does not apply route daily.   rosuvastatin  20 MG tablet Commonly known as: CRESTOR  Take 1 tablet (20 mg total) by mouth daily.        Discharge Exam: There were no vitals filed for this visit. Vitals:   02/17/24 0830 02/17/24 1420  BP: (!) 184/92 (!) 164/81  Pulse: 81 82  Resp: 18 16  Temp: 98.5 F (36.9 C) 98.2 F (36.8 C)  SpO2: 95% 95%   Examination: Physical Exam:  Constitutional: WN/WD, Caucasian female no acute distress Respiratory: Mildly diminished to auscultation bilaterally, no wheezing, rales, rhonchi or crackles. Normal respiratory effort and patient is not tachypenic. No accessory muscle use.  Cardiovascular: RRR, no murmurs / rubs / gallops. S1 and S2 auscultated. No extremity edema.  Abdomen: Soft, non-tender, slightly distended. Bowel sounds positive.  GU: Deferred. Musculoskeletal: No clubbing / cyanosis of digits/nails. No joint deformity upper and lower  extremities.  Skin: No rashes, lesions, ulcers limited skin evaluation. No induration; Warm and dry.  Neurologic: CN 2-12 grossly intact with no focal deficits. Romberg sign and cerebellar reflexes not assessed.  Psychiatric: Normal judgment and insight. Alert and oriented x 3. Normal mood and appropriate affect.   Condition at discharge: stable  The results of significant diagnostics from this hospitalization (including imaging, microbiology, ancillary and laboratory) are listed below for reference.   Imaging Studies: No results found.  Microbiology: Results for orders placed or performed during the hospital encounter of 11/30/23  Surgical pcr screen     Status: Abnormal   Collection Time: 11/30/23  2:00 PM   Specimen: Nasal Mucosa; Nasal Swab  Result Value Ref Range Status   MRSA, PCR NEGATIVE NEGATIVE Final   Staphylococcus aureus POSITIVE (A) NEGATIVE Final    Comment: (NOTE) The Xpert SA Assay (FDA approved for NASAL specimens in patients 22 years  of age and older), is one component of a comprehensive surveillance program. It is not intended to diagnose infection nor to guide or monitor treatment. Performed at [facility], [address] [place], [place] [address]     Labs: CBC: Recent Labs  Lab 02/16/24 2127 02/17/24 0443  WBC 12.3* 13.5*  NEUTROABS  --  9.1*  HGB 15.4* 14.1  HCT 44.6 41.8  MCV 85.8 86.2  PLT 255 276   Basic Metabolic Panel: Recent Labs  Lab 02/16/24 2127 02/16/24 2222 02/17/24 0443  NA 141  --  142  K 2.5*  --  4.0  CL 98  --  104  CO2 29  --  29  GLUCOSE 280*  --  255*  BUN 15  --  14  CREATININE 0.92  --  0.91  CALCIUM  9.1  --  8.5*  MG  --  1.6* 1.9   Liver Function Tests: Recent Labs  Lab 02/16/24 2127 02/17/24 0443  AST 25 22  ALT 10 8  ALKPHOS 111 98  BILITOT 0.4 0.4  PROT 7.5 6.8  ALBUMIN 4.2 3.9   CBG: Recent Labs  Lab 02/17/24 0212 02/17/24 0958 02/17/24 1223  GLUCAP 195* 307* 252*   Discharge time  spent: greater than 30 minutes.  Signed: Alejandro Marker, DO [facility] 02/19/2024

## 2024-03-12 ENCOUNTER — Ambulatory Visit

## 2024-03-19 ENCOUNTER — Ambulatory Visit: Payer: Self-pay

## 2024-03-19 NOTE — Telephone Encounter (Signed)
 FYI Only or Action Required?: Action required by provider: request for appointment.  Patient was last seen in primary care on 10/10/2022 by Kenn Pagan, DO.  Called Nurse Triage reporting Foot Swelling.  Symptoms began yesterday.  Interventions attempted: Rest, hydration, or home remedies.  Symptoms are: unchanged.  Triage Disposition: See PCP When Office is Open (Within 3 Days)  Patient/caregiver understands and will follow disposition?: Unsure   Copied from CRM [id number]. Topic: Clinical - Red Word Triage >> Mar 19, 2024  4:51 PM Chiquita SQUIBB wrote: Red Word that prompted transfer to Nurse Triage: Patient is calling in to schedule an appointment but stated her feet have been swollen since yesterday. Reason for Disposition  [1] MILD swelling of both ankles (e.g., ankle joints look swollen; or bilateral mild pedal edema) AND [2] new-onset or getting worse  (Exceptions: Caused by hot weather, already seen by doctor or NP/PA for this.)  Answer Assessment - Initial Assessment Questions Additional info: Requesting to schedule at [facility], has multiple no shows, unable to schedule in Epic. Please follow up with patient to schedule [phone]   1. LOCATION: Which ankle is swollen? Where is the swelling?     bilateral 2. ONSET: When did the swelling start?     03/18/24 3. SWELLING: How bad is the swelling? Or, How large is it? (e.g., mild, moderate, severe; size of localized swelling)       4. PAIN: Is there any pain? If Yes, ask: How bad is it? (Scale 0-10; or none, mild, moderate, severe)      5. CAUSE: What do you think caused the ankle swelling?     unsure 6. OTHER SYMPTOMS: Do you have any other symptoms? (e.g., fever, chest pain, difficulty breathing, calf pain)     denies 7. PREGNANCY: Is there any chance you are pregnant? When was your last menstrual period?  Protocols used: Ankle Swelling-A-AH

## 2024-03-20 NOTE — Telephone Encounter (Signed)
Called pt - no answer; mailbox is full, unable to leave a message. 

## 2024-04-10 ENCOUNTER — Other Ambulatory Visit: Payer: Self-pay

## 2024-04-10 DIAGNOSIS — E1121 Type 2 diabetes mellitus with diabetic nephropathy: Secondary | ICD-10-CM

## 2024-04-10 MED ORDER — HUMULIN 70/30 KWIKPEN (70-30) 100 UNIT/ML ~~LOC~~ SUPN: 10.0000 [IU] | PEN_INJECTOR | Freq: Two times a day (BID) | SUBCUTANEOUS | 0 refills | Status: DC

## 2024-04-10 NOTE — Telephone Encounter (Unsigned)
 Copied from CRM #[id number]. Topic: Clinical - Medication Refill >> Apr 10, 2024  8:47 AM Susanna ORN wrote: Medication: insulin  isophane & regular human KwikPen (HUMULIN  70/30 KWIKPEN) (70-30) 100 UNIT/ML KwikPen [[id number]]  Has the patient contacted their pharmacy? No (Agent: If no, request that the patient contact the pharmacy for the refill. If patient does not wish to contact the pharmacy document the reason why and proceed with request.) (Agent: If yes, when and what did the pharmacy advise?)  This is the patient's preferred pharmacy:  [facility] [address] [place] - [address] [place] [address] Phone: [phone] Fax: [phone]  Is this the correct pharmacy for this prescription? Yes If no, delete pharmacy and type the correct one.   Has the prescription been filled recently? Yes  Is the patient out of the medication? Yes  Has the patient been seen for an appointment in the last year OR does the patient have an upcoming appointment? Yes  Can we respond through MyChart? No  Agent: Please be advised that Rx refills may take up to 3 business days. We ask that you follow-up with your pharmacy.

## 2024-04-11 ENCOUNTER — Ambulatory Visit

## 2024-04-11 ENCOUNTER — Other Ambulatory Visit: Payer: Self-pay

## 2024-04-11 VITALS — BP 141/83 | HR 91 | Temp 98.3°F | Ht 59.0 in | Wt 137.6 lb

## 2024-04-11 DIAGNOSIS — E785 Hyperlipidemia, unspecified: Secondary | ICD-10-CM

## 2024-04-11 DIAGNOSIS — I10 Essential (primary) hypertension: Secondary | ICD-10-CM | POA: Diagnosis not present

## 2024-04-11 DIAGNOSIS — E1142 Type 2 diabetes mellitus with diabetic polyneuropathy: Secondary | ICD-10-CM | POA: Diagnosis not present

## 2024-04-11 DIAGNOSIS — Z794 Long term (current) use of insulin: Secondary | ICD-10-CM | POA: Diagnosis not present

## 2024-04-11 DIAGNOSIS — E1121 Type 2 diabetes mellitus with diabetic nephropathy: Secondary | ICD-10-CM

## 2024-04-11 DIAGNOSIS — Z79899 Other long term (current) drug therapy: Secondary | ICD-10-CM

## 2024-04-11 LAB — GLUCOSE, CAPILLARY: Glucose-Capillary: 411 mg/dL — ABNORMAL HIGH (ref 70–99)

## 2024-04-11 LAB — POCT GLYCOSYLATED HEMOGLOBIN (HGB A1C): HbA1c, POC (controlled diabetic range): 12.6 % — AB (ref 0.0–7.0)

## 2024-04-11 MED ORDER — INSULIN GLARGINE 100 UNIT/ML SOLOSTAR PEN: 15.0000 [IU] | PEN_INJECTOR | Freq: Every day | SUBCUTANEOUS | 2 refills | Status: AC

## 2024-04-11 MED ORDER — FREESTYLE LIBRE 3 SENSOR MISC: 0 refills | Status: AC

## 2024-04-11 MED ORDER — OZEMPIC (0.25 OR 0.5 MG/DOSE) 2 MG/1.5ML ~~LOC~~ SOPN: 0.2500 mg | PEN_INJECTOR | SUBCUTANEOUS | 1 refills | Status: DC

## 2024-04-11 MED ORDER — FREESTYLE LIBRE 3 READER DEVI: 0 refills | Status: AC

## 2024-04-11 NOTE — Patient Instructions (Addendum)
 Thank you, Ms.Ricka ONEIDA Lamer for allowing us  to provide your care today. Today we discussed the following:  BLOOD PRESSURE:  - For your high blood pressure: We are going to taper you off of your Atenolol  medication. I want you to take Atenolol  50 mg once daily for 6 days, THEN on Friday 04/18/2024 take Atenolol  25 mg (half tablet) once daily for 7 days, THEN STOP. We think this is contributing to your dizziness. We will likely switch you to a different medication once you are tapered off.  - Please continue to check your blood pressure at home, write them down and bring the log we give you to your next appointment   DIABETES:  - We are going to STOP the Humulin  insulin  that you are taking twice a day. We are going to START you on insulin  glargine (Lantus ) 15 units which you will inject once daily.  - We will begin Ozempic  0.25 mg weekly for at least 4 weeks, then at your next appointment we can discuss increasing your dose   I have ordered the following labs for you:  Lab Orders         Glucose, capillary         Lipid Profile         Microalbumin / creatinine urine ratio         POC Hbg A1C       Referrals ordered today:   Referral Orders         Ambulatory referral to Ophthalmology       I have ordered the following medication/changed the following medications:   Stop the following medications: Medications Discontinued During This Encounter  Medication Reason   insulin  isophane & regular human KwikPen (HUMULIN  70/30 KWIKPEN) (70-30) 100 UNIT/ML KwikPen    insulin  lispro (HUMALOG ) 100 UNIT/ML injection    ondansetron  (ZOFRAN -ODT) 4 MG disintegrating tablet              Start the following medications: Meds ordered this encounter  Medications   Continuous Glucose Sensor (FREESTYLE LIBRE 3 SENSOR) MISC    Sig: PLACE SENSOR ON THE SKIN EVERY 14 DAYS TO CHECK GLUXOSE CONTINUOUSLY    Dispense:  6 each    Refill:  0   Continuous Glucose Receiver (FREESTYLE LIBRE 3 READER) DEVI     Sig: Please check CGM daily.    Dispense:  1 each    Refill:  0   insulin  glargine (LANTUS ) 100 UNIT/ML Solostar Pen    Sig: Inject 15 Units into the skin daily.    Dispense:  15 mL    Refill:  2   Semaglutide ,0.25 or 0.5MG /DOS, (OZEMPIC , 0.25 OR 0.5 MG/DOSE,) 2 MG/1.5ML SOPN    Sig: Inject 0.25 mg into the skin once a week.    Dispense:  0.75 mL    Refill:  1     Follow up: 3-4 weeks    Remember: Please bring your blood pressure log AND all of your medications AND your CGM glucose monitor    Should you have any questions or concerns please call the Internal Medicine Clinic at [phone].     Doyal Miyamoto, MD [facility]

## 2024-04-11 NOTE — Progress Notes (Unsigned)
 Patient name: Jennifer Ingram Date of birth: 09-19-1950 Date of visit: 04/13/2024  Type of visit: Established Patient Office Visit   Subjective   Chief concern:  Chief Complaint  Patient presents with   Follow-up   Diabetes   Hypertension   Depression   Dizziness    Pt reports having  vertigo  for the past 2 wk now   and has not tried any OTC or have any medicine for it     Jennifer Ingram is a 73 y.o. female with a history of DMII, HTN, hypothyroidism, CKD3b, HLD,  who presents to [facility] clinic for follow up of chronic conditions.  Patient presents to clinic today for follow-up of chronic conditions.  She has a history of type 2 diabetes and hypertension.  She reports complete adherence to her antihypertensive medication regimen of atenolol  50 mg twice daily.  She does report dizziness, and has been having episodes of dizziness recently.  Orthostatics performed in the office today were 135/60 sitting, 121/80 lying, 111/62 standing, with associated dizziness when she change positions.  She reports that she normally drinks about 3 cups of water daily and this morning has has only had about half a cup of water.  When asked why she takes atenolol , she is unsure and states that she was started on this a long time ago.  She reports that her home blood pressure taken yesterday was 125/80.  In terms of her diabetes, patient takes Humulin  70-30, taking 10 units twice daily, though states that she has been out of the medication since last night but otherwise has not missed any doses.  She does have a libre CGM, though has not been wearing it and thinks that she may be more sensors.  Her A1c today is 12.6, and her last A1c on 02/17/2024 was 11.1.  Prior to this in 09/2022, her A1c was well-controlled at 7.1.  By chart review it appears that she was previously on Ozempic  2 mg weekly, Jardiance  25 mg daily, Humulin  22 units twice daily, though was then hospitalized 01/06/2023 and her Ozempic  and Jardiance   diabetes medications were paused upon discharge, and her Humulin  was decreased to 10 units twice daily.  She was supposed to follow-up with PCP after discharge, though has unfortunately not been seen since that hospitalization in the [facility].  She was hospitalized earlier this year due to hyperglycemia and her A1c was found to be greater than 15.  She is otherwise unsure why she takes Humulin .  She has previously tried metformin, though this caused GI upset.   ROS: Negative unless otherwise listed in HPI.  Patient Active Problem List   Diagnosis Date Noted   Hypomagnesemia 02/17/2024   Nonrheumatic aortic valve stenosis 11/15/2023   Closed 3-part fracture of proximal humerus, right, initial encounter 11/11/2023   Acute encephalopathy 07/19/2023   Syncope and collapse 01/05/2023   Orthostatic hypotension 01/04/2023   Systolic murmur 10/10/2022   Diminished pulses in lower extremity 11/30/2021   Pain due to onychomycosis of toenails of both feet 02/14/2021   CKD stage 3b, GFR 30-44 ml/min (HCC) 12/20/2020   Depression 12/17/2020   Diabetes mellitus with polyneuropathy (HCC) 12/17/2020   Heart palpitations 12/17/2020   Acquired hypothyroidism 12/17/2020   Hypertension 12/17/2020   Hyperlipidemia 12/17/2020   Intertrigo 12/17/2020     Past Surgical History:  Procedure Laterality Date   APPENDECTOMY  1968   CESAREAN SECTION  1980   CHOLECYSTECTOMY  1985   ORIF HUMERUS FRACTURE Right  11/30/2023   Procedure: OPEN REDUCTION INTERNAL FIXATION (ORIF) PROXIMAL HUMERUS FRACTURE, RIGHT;  Surgeon: Sharl Selinda Dover, MD;  Location: [facility] OR;  Service: Orthopedics;  Laterality: Right;   TONSILLECTOMY  1960     Current Outpatient Medications  Medication Instructions   Accu-Chek Softclix Lancets lancets Check blood sugar 3 times a day   acetaminophen  (TYLENOL ) 650 mg, Oral, Every 6 hours PRN   atenolol  (TENORMIN ) 50 mg, Oral, 2 times daily   Continuous Glucose Receiver (FREESTYLE LIBRE 3 READER)  DEVI Please check CGM daily.   Continuous Glucose Sensor (FREESTYLE LIBRE 3 SENSOR) MISC PLACE SENSOR ON THE SKIN EVERY 14 DAYS TO CHECK GLUXOSE CONTINUOUSLY   Euthyrox  50 mcg, Oral, Daily   gabapentin  (NEURONTIN ) 300 mg, 2 times daily   glucose blood (ACCU-CHEK GUIDE) test strip Check blood sugar 3 times per day   insulin  glargine (LANTUS ) 15 Units, Subcutaneous, Daily   Insulin  Pen Needle (PEN NEEDLES) 31G X 5 MM MISC 1 each, Does not apply, Daily   Ozempic  (0.25 or 0.5 MG/DOSE) 0.25 mg, Subcutaneous, Weekly   PARoxetine  (PAXIL ) 40 MG tablet TAKE 1 TABLET BY MOUTH ONCE DAILY AFTER BREAKFAST   rosuvastatin  (CRESTOR ) 20 mg, Oral, Daily    Social History[1]    Objective  Today's Vitals   04/11/24 0946  BP: (!) 141/83  Pulse: 91  Temp: 98.3 F (36.8 C)  TempSrc: Oral  SpO2: 98%  Weight: 137 lb 9.6 oz (62.4 kg)  Height: 4' 11 (1.499 m)  Body mass index is 27.79 kg/m.   Physical Exam:   Constitutional: well-appearing female sitting in exam chair, in no acute distress. Ambulates with use of assistance device  HEENT: normocephalic atraumatic, mucous membranes moist Eyes: conjunctiva non-erythematous Neck: supple Cardiovascular: regular rate and rhythm, murmur present, bilateral radial pulses 2+, bilateral dorsal pedal pulses 2+, brisk capillary refill bilateral feet and hands  Pulmonary/Chest: normal work of breathing on room air, lungs clear to auscultation bilaterally Abdominal: soft, non-tender, non-distended MSK: normal bulk and tone. Neurological: alert & oriented x 3, sensation intact bilateral feet to monofilament Skin: warm and dry, no ulcers or lesions on bilateral feet Psych: mood calm, behavior normal, thought content normal, judgement normal    Last hemoglobin A1c Lab Results  Component Value Date   HGBA1C 12.6 (A) 04/11/2024      The ASCVD Risk score (Arnett DK, et al., 2019) failed to calculate for the following reasons:   The valid total cholesterol range  is 130 to 320 mg/dL      Assessment & Plan  Problem List Items Addressed This Visit     Diabetes mellitus with polyneuropathy (HCC) (Chronic)   Patient has a history of type 2 diabetes, A1c today of 12.6.  She was previously well-controlled in 09/2022 with an A1c of 7.1, and at that time her diabetes regimen was Ozempic  2 mg weekly, Jardiance  25 mg daily, and Humulin  22 units twice daily.  Unfortunately she was hospitalized in 12/2022, and during that hospitalization her Ozempic  and Jardiance  were held upon discharge, and her Humulin  was decreased to 10 units twice daily.  She was instructed to follow-up in the [facility] with PCP following this hospitalization, though she never did and has been on this regimen ever since then by chart review.  Unfortunately she was also hospitalized due to hyperglycemia earlier this year, and A1c was found to be > 15.5.  She is unsure why she was put on Humulin  in the first place, though after discussion with  me today she is willing to switch to Lantus  insulin  glargine.  Will plan to begin her on 15 units nightly, though imagine that this will need to be increased once we have more data on her blood glucose levels.  She does have a Libre CGM, though will need refills.  We also discussed that I would like to resume her GLP-1, and begin Ozempic  with appropriate up titration which she is willing to do.  Unfortunately due to her hyperglycemia and uncontrolled diabetes we will not resume Jardiance  at this time, though once she is better controlled this should be considered.  Foot exam completed in the office today with sensation intact bilaterally to monofilament, with no ulcers or lesions on bilateral feet, and 2+ dorsal pedal pulses.  We will refer her to ophthalmology for diabetic retinopathy exam.  Will check her urine ACR today.  She is takes rosuvastatin  20 mg daily, so we will recheck lipid panel today.  LDL goal of < 70, and if not below goal will consider up titration of her  statin therapy.  I would like for her to see RD Arland Hole again given her worsening diabetes, and new regimen.  I would like for her to come back in 3 weeks for BP recheck and diabetes recheck, and will plan for repeat A1c in 3 months. - STOP Humulin  10 units BID - START Lantus  insulin  glargine 15 units nightly - START Ozempic  0.25 mg weekly injection x4 weeks at least, consider uptitration if able - Refill Libre CGM sensors and receiver  - Referral to Ophthalmology - Foot exam: completed today, wnl -  f/u UACR results - f/u lipid panel; goal LDL < 70 - Referral to RD [facility]  - f/u in 3 weeks for BP and diabetes recheck - A1c repeat in 3 months        Relevant Medications   insulin  glargine (LANTUS ) 100 UNIT/ML Solostar Pen   Semaglutide ,0.25 or 0.5MG /DOS, (OZEMPIC , 0.25 OR 0.5 MG/DOSE,) 2 MG/1.5ML SOPN   Other Relevant Orders   Referral to Nutrition and Diabetes Services   Hypertension   Patient has a history of hypertension which has been controlled with atenolol  50 mg twice daily for many years.  Unfortunately she has been having many episodes of dizziness, and orthostatic vital signs completed in the office today are positive with readings of 135/60 sitting, 121/80 lying, 111/62 standing, with associated dizziness when she change positions.  Given her age and symptomatic orthostatic hypotension, we discussed switching her to a different blood pressure medication which she is agreeable to.  She also has currently uncontrolled diabetes, and would benefit from ARB therapy for renal protection as well.  However, given this is the first time we are seeing her in the last year and a half, I would like to first taper her off of the atenolol , see her back in the office for short follow-up in the next 3 weeks, and at that time consider starting ARB therapy if her blood pressure can handle. - TAPER off Atenolol  over 14 days: Take Atenolol  50 mg daily for 7 days, then take Atenolol  25 mg  (half tablet) daily for 7 days, then STOP - Patient instructed to check blood pressure daily, and write it down to bring to her next visit - f/u in 3 weeks for BP recheck - Consider initiation of ARB      Hyperlipidemia   Patient takes rosuvastatin  20 mg daily.  Will recheck lipid panel today. LDL goal < 70,  if not at goal, consider uptitration of statin therapy.  - Continue Rosuvastatin  20 mg daily - f/u lipid panel       Relevant Orders   Lipid Profile (Completed)   Other Visit Diagnoses       Type 2 diabetes mellitus with diabetic nephropathy, unspecified whether long term insulin  use (HCC)    -  Primary   Relevant Medications   Continuous Glucose Sensor (FREESTYLE LIBRE 3 SENSOR) MISC   Continuous Glucose Receiver (FREESTYLE LIBRE 3 READER) DEVI   insulin  glargine (LANTUS ) 100 UNIT/ML Solostar Pen   Semaglutide ,0.25 or 0.5MG /DOS, (OZEMPIC , 0.25 OR 0.5 MG/DOSE,) 2 MG/1.5ML SOPN   Other Relevant Orders   POC Hbg A1C (Completed)   Ambulatory referral to Ophthalmology   Lipid Profile (Completed)   Microalbumin / creatinine urine ratio (Completed)   Referral to Nutrition and Diabetes Services       Return in about 3 weeks (around 05/02/2024) for BP recheck, diabetes f/u .  Patient discussed with Dr. Lovie, who also saw and evaluated the patient.  Doyal Miyamoto, MD [facility]  PGY-1 04/13/2024, 5:59 PM      [1]  Social History Tobacco Use   Smoking status: Never   Smokeless tobacco: Never  Vaping Use   Vaping status: Never Used  Substance Use Topics   Alcohol use: No   Drug use: No

## 2024-04-12 LAB — LIPID PANEL
Chol/HDL Ratio: 2.9 ratio (ref 0.0–4.4)
Cholesterol, Total: 121 mg/dL (ref 100–199)
HDL: 42 mg/dL (ref >39)
LDL Chol Calc (NIH): 53 mg/dL (ref 0–99)
Triglycerides: 151 mg/dL — ABNORMAL HIGH (ref 0–149)
VLDL Cholesterol Cal: 26 mg/dL (ref 5–40)

## 2024-04-12 LAB — MICROALBUMIN / CREATININE URINE RATIO
Creatinine, Urine: 48.9 mg/dL
Microalb/Creat Ratio: 50 mg/g{creat} — ABNORMAL HIGH (ref 0–29)
Microalbumin, Urine: 24.5 ug/mL

## 2024-04-13 NOTE — Assessment & Plan Note (Signed)
 Patient has a history of hypertension which has been controlled with atenolol  50 mg twice daily for many years.  Unfortunately she has been having many episodes of dizziness, and orthostatic vital signs completed in the office today are positive with readings of 135/60 sitting, 121/80 lying, 111/62 standing, with associated dizziness when she change positions.  Given her age and symptomatic orthostatic hypotension, we discussed switching her to a different blood pressure medication which she is agreeable to.  She also has currently uncontrolled diabetes, and would benefit from ARB therapy for renal protection as well.  However, given this is the first time we are seeing her in the last year and a half, I would like to first taper her off of the atenolol , see her back in the office for short follow-up in the next 3 weeks, and at that time consider starting ARB therapy if her blood pressure can handle. - TAPER off Atenolol  over 14 days: Take Atenolol  50 mg daily for 7 days, then take Atenolol  25 mg (half tablet) daily for 7 days, then STOP - Patient instructed to check blood pressure daily, and write it down to bring to her next visit - f/u in 3 weeks for BP recheck - Consider initiation of ARB

## 2024-04-13 NOTE — Assessment & Plan Note (Addendum)
 Patient has a history of type 2 diabetes, A1c today of 12.6.  She was previously well-controlled in 09/2022 with an A1c of 7.1, and at that time her diabetes regimen was Ozempic  2 mg weekly, Jardiance  25 mg daily, and Humulin  22 units twice daily.  Unfortunately she was hospitalized in 12/2022, and during that hospitalization her Ozempic  and Jardiance  were held upon discharge, and her Humulin  was decreased to 10 units twice daily.  She was instructed to follow-up in the [facility] with PCP following this hospitalization, though she never did and has been on this regimen ever since then by chart review.  Unfortunately she was also hospitalized due to hyperglycemia earlier this year, and A1c was found to be > 15.5.  She is unsure why she was put on Humulin  in the first place, though after discussion with me today she is willing to switch to Lantus  insulin  glargine.  Will plan to begin her on 15 units nightly, though imagine that this will need to be increased once we have more data on her blood glucose levels.  She does have a Libre CGM, though will need refills.  We also discussed that I would like to resume her GLP-1, and begin Ozempic  with appropriate up titration which she is willing to do.  Unfortunately due to her hyperglycemia and uncontrolled diabetes we will not resume Jardiance  at this time, though once she is better controlled this should be considered.  Foot exam completed in the office today with sensation intact bilaterally to monofilament, with no ulcers or lesions on bilateral feet, and 2+ dorsal pedal pulses.  We will refer her to ophthalmology for diabetic retinopathy exam.  Will check her urine ACR today.  She is takes rosuvastatin  20 mg daily, so we will recheck lipid panel today.  LDL goal of < 70, and if not below goal will consider up titration of her statin therapy.  I would like for her to see RD Arland Hole again given her worsening diabetes, and new regimen.  I would like for her to come back in 3  weeks for BP recheck and diabetes recheck, and will plan for repeat A1c in 3 months. - STOP Humulin  10 units BID - START Lantus  insulin  glargine 15 units nightly - START Ozempic  0.25 mg weekly injection x4 weeks at least, consider uptitration if able - Refill Libre CGM sensors and receiver  - Referral to Ophthalmology - Foot exam: completed today, wnl -  f/u UACR results - f/u lipid panel; goal LDL < 70 - Referral to RD [facility]  - f/u in 3 weeks for BP and diabetes recheck - A1c repeat in 3 months

## 2024-04-13 NOTE — Assessment & Plan Note (Signed)
 Patient takes rosuvastatin  20 mg daily.  Will recheck lipid panel today. LDL goal < 70, if not at goal, consider uptitration of statin therapy.  - Continue Rosuvastatin  20 mg daily - f/u lipid panel

## 2024-04-14 ENCOUNTER — Ambulatory Visit: Payer: Self-pay

## 2024-04-14 NOTE — Progress Notes (Signed)
 Internal Medicine Clinic Attending  I was physically present during the key portions of the resident provided service and participated in the medical decision making of patient's management care. I reviewed pertinent patient test results.  The assessment, diagnosis, and plan were formulated together and I agree with the documentation in the resident's note.  Lovie Clarity, MD    I have messaged our administrative team to schedule the patient for close follow-up.

## 2024-04-17 ENCOUNTER — Telehealth: Payer: Self-pay | Admitting: *Deleted

## 2024-04-17 NOTE — Telephone Encounter (Signed)
 Copied from CRM #[id number]. Topic: Clinical - Prescription Issue >> Apr 17, 2024 12:51 PM DeAngela L wrote: Reason for CRM:The pharmacist Hawa with [facility] calling cause  Semaglutide ,0.25 or 0.5MG /DOS, (OZEMPIC , 0.25 OR 0.5 MG/DOSE,) 2 MG/1.5ML SOPN is no longer available  but the 2mg /57ml is available at [facility] [address] [place] - [address] [place] [address] Phone: [phone] Fax: [phone]  The pharmacist tried to reach the office for this correction as soon as possible

## 2024-04-28 ENCOUNTER — Telehealth: Payer: Self-pay | Admitting: *Deleted

## 2024-04-28 DIAGNOSIS — E1121 Type 2 diabetes mellitus with diabetic nephropathy: Secondary | ICD-10-CM

## 2024-04-28 MED ORDER — SEMAGLUTIDE(0.25 OR 0.5MG/DOS) 2 MG/3ML ~~LOC~~ SOPN: 0.2500 mg | PEN_INJECTOR | SUBCUTANEOUS | 0 refills | Status: AC

## 2024-04-28 NOTE — Telephone Encounter (Signed)
 Call to Pharmacy.  Change has not been done for the medication.  Need prescription for the 2mg /3 ml .  Will forward to [employer].                      Copied from CRM [id number]. Topic: Clinical - Prescription Issue >> Apr 17, 2024 12:51 PM DeAngela L wrote: Reason for CRM:The pharmacist Hawa with [facility] calling cause  Semaglutide ,0.25 or 0.5MG /DOS, (OZEMPIC , 0.25 OR 0.5 MG/DOSE,) 2 MG/1.5ML SOPN is no longer available  but the 2mg /46ml is available at [facility] [address] [place] - [address] [place] [address] Phone: [phone] Fax: [phone]  The pharmacist tried to reach the office for this correction as soon as possible >> Apr 25, 2024 11:52 AM Susanna ORN wrote: Kimber, with [facility], calling about Semaglutide ,0.25 or 0.5MG /DOS, (OZEMPIC , 0.25 OR 0.5 MG/DOSE,) 2 MG/1.5ML SOPN. She states that this strength is no longer available. It now comed in 3mg / 2 mL. She states they will need a new prescription. They are needing this as soon as possible.

## 2024-05-04 NOTE — Patient Instructions (Incomplete)
 Ms. Rolande Moe, thank you for coming in today. Todays visit was about checking in on how you have been feeling since your last appointment and making sure your medications and care plan are moving you in the right direction.  Over the past few weeks, you have been dealing with high blood sugars, blood pressure changes, and dizziness, especially when standing up. We talked through what has been happening and reviewed the medication changes that were started at your last visit.  Your Diabetes  At your last visit, we saw that your blood sugar levels were much higher than we want them to be. Because of that, your insulin  was changed to Lantus , which you take once a day, and Ozempic  was restarted as a weekly injection. These changes were made to give you better and steadier blood sugar control.  Today, we reviewed how this new plan is going. It is very important that you check your blood sugars regularly and wear your Libre sensor consistently, as this helps us  know if adjustments are needed. Better blood sugar control will also help protect your kidneys, nerves, eyes, and heart.  Your Blood Pressure and Dizziness  You told us  that you have been feeling dizzy when standing, and at your last visit, we saw that your blood pressure dropped when you changed positions. This can happen when the body does not adjust quickly enough, especially if you are not drinking enough fluids or if a medication is contributing.  Because of this, we started a slow taper off atenolol , a blood pressure medication you have been on for many years. This is being done gradually to keep you safe. We also talked about how important it is to drink more water and stand up slowly to reduce dizziness.  You were asked to check your blood pressure at home every day, write the numbers down, and bring them to your next visit so we can decide on the best long-term plan.

## 2024-05-04 NOTE — Progress Notes (Deleted)
 "  CC: ***  HPI:  Jennifer Ingram is a 74 y.o. female living with a history stated below and presents today for ***. Please see problem based assessment and plan for additional details.  Past Medical History:  Diagnosis Date   Arthritis    bilateral knees and back;   Depression    on meds, working well   Diabetes mellitus without complication (HCC) 05/31/2012   on meds   Heart palpitations    rapid - hx, no current problem per pt on 11/28/23   Hyperlipidemia    on meds   Hypertension    on meds   Hypothyroidism    Nausea & vomiting 02/17/2024    Medications Ordered Prior to Encounter[1]  Family History  Problem Relation Age of Onset   Cancer Mother        leukemia   Cancer Father        lung   Breast cancer Neg Hx    Colon polyps Neg Hx    Crohn's disease Neg Hx    Esophageal cancer Neg Hx    Stomach cancer Neg Hx    Rectal cancer Neg Hx     Social History   Socioeconomic History   Marital status: Widowed    Spouse name: Not on file   Number of children: 1   Years of education: 66   Highest education level: 12th grade  Occupational History   Not on file  Tobacco Use   Smoking status: Never   Smokeless tobacco: Never  Vaping Use   Vaping status: Never Used  Substance and Sexual Activity   Alcohol use: No   Drug use: No   Sexual activity: Not Currently    Birth control/protection: Post-menopausal  Other Topics Concern   Not on file  Social History Narrative   Not on file   Social Drivers of Health   Tobacco Use: Low Risk (04/11/2024)   Patient History    Smoking Tobacco Use: Never    Smokeless Tobacco Use: Never    Passive Exposure: Not on file  Financial Resource Strain: Not on file  Food Insecurity: No Food Insecurity (12/01/2023)   Epic    Worried About Programme Researcher, Broadcasting/film/video in the Last Year: Never true    Ran Out of Food in the Last Year: Never true  Transportation Needs: No Transportation Needs (12/01/2023)   Epic    Lack of Transportation  (Medical): No    Lack of Transportation (Non-Medical): No  Physical Activity: Sufficiently Active (10/10/2022)   Exercise Vital Sign    Days of Exercise per Week: 3 days    Minutes of Exercise per Session: 60 min  Stress: No Stress Concern Present (10/10/2022)   [employer] of Occupational Health - Occupational Stress Questionnaire    Feeling of Stress : Not at all  Social Connections: Moderately Integrated (12/01/2023)   Social Connection and Isolation Panel    Frequency of Communication with Friends and Family: More than three times a week    Frequency of Social Gatherings with Friends and Family: Not on file    Attends Religious Services: 1 to 4 times per year    Active Member of [employer] or Organizations: No    Attends Banker Meetings: 1 to 4 times per year    Marital Status: Widowed  Intimate Partner Violence: Not At Risk (12/01/2023)   Epic    Fear of Current or Ex-Partner: No    Emotionally Abused: No  Physically Abused: No    Sexually Abused: No  Depression (PHQ2-9): Low Risk (04/11/2024)   Depression (PHQ2-9)    PHQ-2 Score: 0  Alcohol Screen: Low Risk (10/10/2022)   Alcohol Screen    Last Alcohol Screening Score (AUDIT): 0  Housing: Low Risk (12/01/2023)   Epic    Unable to Pay for Housing in the Last Year: No    Number of Times Moved in the Last Year: 0    Homeless in the Last Year: No  Utilities: Not At Risk (12/01/2023)   Epic    Threatened with loss of utilities: No  Health Literacy: Not on file    Review of Systems: ROS negative except for what is noted on the assessment and plan.  There were no vitals filed for this visit.  Physical Exam  Physical Exam: Constitutional: well-appearing *** sitting in ***, in no acute distress HENT: normocephalic atraumatic, mucous membranes moist Eyes: conjunctiva non-erythematous Neck: supple Cardiovascular: regular rate and rhythm, no m/r/g Pulmonary/Chest: normal work of breathing on room air, lungs clear  to auscultation bilaterally Abdominal: soft, non-tender, non-distended MSK: *** Neurological: alert & oriented x 3, 5/5 strength in bilateral upper and lower extremities, normal gait Skin: warm and dry Psych: ***  Assessment & Plan:   No problem-specific Assessment & Plan notes found for this encounter.   Patient {[id number]::discussed with,seen with} Dr. {[id number]::Tpoopjfd,Z. Hoffman,Winfrey,Narendra,Chun,Chambliss,Lau,Machen}  Armando Rossetti M.D [facility] Internal Medicine, PGY-1 Phone: [phone] Date 05/04/2024 Time 10:57 PM    Jennifer Ingram is a 74 year old woman with poorly controlled type 2 diabetes complicated by polyneuropathy and diabetic nephropathy (A1c 12.6 on 12/12, UACR elevated), CKD stage 3b with stable renal function (Cr 0.91), long-standing hypertension previously on atenolol  with symptomatic orthostatic hypotension now tapering off beta-blocker, nonrheumatic aortic valve stenosis with systolic murmur and prior syncope, hyperlipidemia on rosuvastatin  20 mg daily (LDL 53), hypothyroidism on levothyroxine  50 mcg daily (TSH 1.4), depression on paroxetine , and chronic neuropathic pain on gabapentin , who was seen last visit for dizziness and uncontrolled diabetes and is now presenting for short-interval follow-up after transition from Humulin  to insulin  glargine 15 units daily and restart of semaglutide  0.25 mg weekly.   Type 2 Diabetes Mellitus with Polyneuropathy and Diabetic Nephropathy (HCC) Poorly controlled DM2 with recent A1c 12.6 (04/11/24), previously well controlled (A1c 7.1 in 09/2022). At last visit, transitioned from Humulin  70/30 to basal insulin  glargine 15 units daily and restarted semaglutide  0.25 mg weekly due to prolonged medication interruption after 12/2022 hospitalization. Has CGM but previously not wearing consistently. Complicated by neuropathy and microalbuminuria (UACR elevated). No foot ulcers; sensation intact on last  exam.  Plan: Continue insulin  glargine 15 units daily Review home glucose data and CGM use; reinforce consistent wear Assess tolerance to semaglutide  0.25 mg weekly If tolerating, plan uptitration per protocol Hold SGLT2 inhibitor for now given severe hyperglycemia; reconsider once improved control Ophthalmology referral pending for diabetic eye exam Nutrition referral (RD Arland Plyler) pending Repeat A1c in ~3 months  Hypertension with Symptomatic Orthostatic Hypotension Long-standing hypertension previously treated with atenolol  50 mg BID, complicated by symptomatic orthostasis and dizziness at last visit with positive orthostatic vitals. Atenolol  taper initiated due to unclear indication and adverse effects. Last clinic BP 141/83, home BP reportedly lower.  Plan: Confirm progress of atenolol  taper and current dose Review home BP log Encourage adequate hydration Avoid aggressive BP lowering given orthostasis and aortic stenosis If BP tolerates and orthostasis improves, consider starting ARB for renal protection at next step Recheck orthostatic  vitals today  Dizziness / Orthostasis (Follow-up) Likely multifactorial: dehydration, beta-blocker effect, autonomic dysfunction, valvular disease.  Plan: Reinforce hydration (goal >3 cups/day) Slow positional changes   Chronic Kidney Disease Stage 3b (HCC) CKD stage 3b, likely diabetic etiology. Renal function stable (Cr 0.91, K 4.0). Persistent albuminuria.  Plan: Continue renal-protective strategy through BP and glycemic control Avoid nephrotoxins and NSAIDs Reassess candidacy for ACE/ARB once orthostasis improves Annual eGFR and UACR monitoring  Nonrheumatic Aortic Valve Stenosis with Systolic Murmur Known AS with history of syncope and dizziness, which complicates BP management.  Plan: Monitor for red-flag symptoms (syncope, chest pain, dyspnea) Avoid rapid BP reductions Ensure cardiology follow-up is established    Hyperlipidemia Well controlled on current therapy (LDL 53 on rosuvastatin ). Continue rosuvastatin  20 mg daily      [1]  Current Outpatient Medications on File Prior to Visit  Medication Sig Dispense Refill   Accu-Chek Softclix Lancets lancets Check blood sugar 3 times a day 100 each 11   acetaminophen  (TYLENOL ) 325 MG tablet Take 2 tablets (650 mg total) by mouth every 6 (six) hours as needed for mild pain (pain score 1-3) (or Fever >/= 101). 20 tablet 0   atenolol  (TENORMIN ) 50 MG tablet Take 50 mg by mouth 2 (two) times daily.     Continuous Glucose Receiver (FREESTYLE LIBRE 3 READER) DEVI Please check CGM daily. 1 each 0   Continuous Glucose Sensor (FREESTYLE LIBRE 3 SENSOR) MISC PLACE SENSOR ON THE SKIN EVERY 14 DAYS TO CHECK GLUXOSE CONTINUOUSLY 6 each 0   EUTHYROX  50 MCG tablet Take 1 tablet by mouth once daily 30 tablet 1   gabapentin  (NEURONTIN ) 300 MG capsule Take 300 mg by mouth 2 (two) times daily.     glucose blood (ACCU-CHEK GUIDE) test strip Check blood sugar 3 times per day 100 each 12   insulin  glargine (LANTUS ) 100 UNIT/ML Solostar Pen Inject 15 Units into the skin daily. 15 mL 2   Insulin  Pen Needle (PEN NEEDLES) 31G X 5 MM MISC 1 each by Does not apply route daily. 90 each 3   PARoxetine  (PAXIL ) 40 MG tablet TAKE 1 TABLET BY MOUTH ONCE DAILY AFTER BREAKFAST 90 tablet 2   rosuvastatin  (CRESTOR ) 20 MG tablet Take 1 tablet (20 mg total) by mouth daily. 90 tablet 4   Semaglutide ,0.25 or 0.5MG /DOS, 2 MG/3ML SOPN Inject 0.25 mg into the skin once a week. 3 mL 0   No current facility-administered medications on file prior to visit.   "

## 2024-05-05 ENCOUNTER — Ambulatory Visit: Payer: Self-pay

## 2024-05-07 ENCOUNTER — Telehealth: Payer: Self-pay | Admitting: Dietician

## 2024-05-07 NOTE — Telephone Encounter (Signed)
 Received fax from [facility] for [facility] 3 plus sensors. Patient just picked up 6 sensors(  3 months supply)  at [facility] and has a history of getting her medications and previous sensor there. Will have fax sent back that this order is refused.

## 2024-05-08 ENCOUNTER — Telehealth: Payer: Self-pay | Admitting: *Deleted

## 2024-05-08 NOTE — Telephone Encounter (Signed)
 Mammogram appointment February 12.2026 @ 11:10 am / arrive 10:45am breast center - [phone]. Appointment mailed to the patient with the information regarding the 75.00 now show

## 2024-05-19 ENCOUNTER — Telehealth: Payer: Self-pay | Admitting: *Deleted

## 2024-05-19 NOTE — Telephone Encounter (Signed)
 Called patient LVM regarding her mammogram appointment (February 12,2026 @ 11:10 am to arrive 10:45 am. This appointment was mailed to the patient / mail was returned. Waiting call back from patient to make sure address is correct that we have in our system. Will resend appointment.

## 2024-05-23 NOTE — Progress Notes (Signed)
 Jennifer Ingram                                          MRN: [id number]   05/23/2024   The [facility] Quality Team Specialist reviewed this patient medical record for the purposes of chart review for care gap closure. The following were reviewed: chart review for care gap closure-glycemic status assessment.    [facility] Quality Team

## 2024-05-30 ENCOUNTER — Encounter: Payer: Self-pay | Admitting: *Deleted

## 2024-05-30 NOTE — Progress Notes (Signed)
 Jennifer Ingram                                          MRN: [id number]   05/30/2024   The [facility] Quality Team Specialist reviewed this patient medical record for the purposes of chart review for care gap closure. The following were reviewed: chart review for care gap closure-glycemic status assessment.    [facility] Quality Team

## 2024-06-12 ENCOUNTER — Ambulatory Visit
